# Patient Record
Sex: Female | Born: 1965 | Race: Black or African American | Hispanic: No | Marital: Married | Smoking: Never smoker
Health system: Southern US, Community
[De-identification: ages and names within clinical notes are randomized; demographics above are authoritative.]

## PROBLEM LIST (undated history)

## (undated) DIAGNOSIS — D649 Anemia, unspecified: Secondary | ICD-10-CM

## (undated) DIAGNOSIS — K219 Gastro-esophageal reflux disease without esophagitis: Secondary | ICD-10-CM

## (undated) DIAGNOSIS — K760 Fatty (change of) liver, not elsewhere classified: Secondary | ICD-10-CM

## (undated) DIAGNOSIS — I8501 Esophageal varices with bleeding: Secondary | ICD-10-CM

## (undated) DIAGNOSIS — G629 Polyneuropathy, unspecified: Secondary | ICD-10-CM

## (undated) DIAGNOSIS — K7460 Unspecified cirrhosis of liver: Secondary | ICD-10-CM

## (undated) DIAGNOSIS — N183 Chronic kidney disease, stage 3 unspecified: Secondary | ICD-10-CM

## (undated) DIAGNOSIS — E119 Type 2 diabetes mellitus without complications: Secondary | ICD-10-CM

## (undated) DIAGNOSIS — I10 Essential (primary) hypertension: Secondary | ICD-10-CM

## (undated) HISTORY — PX: OTHER SURGICAL HISTORY: SHX169

## (undated) HISTORY — PX: CHOLECYSTECTOMY: SHX55

## (undated) HISTORY — PX: CHOLECYSTECTOMY OPEN: SUR202

---

## 2003-05-27 ENCOUNTER — Encounter: Admission: RE | Admit: 2003-05-27 | Discharge: 2003-05-27 | Payer: Self-pay | Admitting: Nephrology

## 2004-06-18 ENCOUNTER — Other Ambulatory Visit: Payer: Self-pay

## 2004-06-18 ENCOUNTER — Emergency Department: Payer: Self-pay | Admitting: General Practice

## 2010-03-01 ENCOUNTER — Emergency Department (HOSPITAL_COMMUNITY)
Admission: EM | Admit: 2010-03-01 | Discharge: 2010-03-01 | Payer: Self-pay | Source: Home / Self Care | Admitting: Emergency Medicine

## 2010-03-06 ENCOUNTER — Emergency Department (HOSPITAL_COMMUNITY)
Admission: EM | Admit: 2010-03-06 | Discharge: 2010-03-06 | Disposition: A | Discharge status: Home / Self Care | Payer: PRIVATE HEALTH INSURANCE | Attending: Emergency Medicine | Admitting: Emergency Medicine

## 2010-03-06 DIAGNOSIS — Z79899 Other long term (current) drug therapy: Secondary | ICD-10-CM | POA: Insufficient documentation

## 2010-03-06 DIAGNOSIS — E119 Type 2 diabetes mellitus without complications: Secondary | ICD-10-CM | POA: Insufficient documentation

## 2010-03-06 DIAGNOSIS — I10 Essential (primary) hypertension: Secondary | ICD-10-CM | POA: Insufficient documentation

## 2010-03-06 DIAGNOSIS — R350 Frequency of micturition: Secondary | ICD-10-CM | POA: Insufficient documentation

## 2010-03-06 LAB — GLUCOSE, CAPILLARY
Glucose-Capillary: 425 mg/dL — ABNORMAL HIGH (ref 70–99)
Glucose-Capillary: 365 mg/dL — ABNORMAL HIGH (ref 70–99)

## 2010-03-06 LAB — DIFFERENTIAL
Lymphocytes Relative: 10 % — ABNORMAL LOW (ref 12–46)
Lymphs Abs: 0.7 10*3/uL (ref 0.7–4.0)
Monocytes Relative: 6 % (ref 3–12)
Neutrophils Relative %: 84 % — ABNORMAL HIGH (ref 43–77)

## 2010-03-06 LAB — URINALYSIS, ROUTINE W REFLEX MICROSCOPIC
Bilirubin Urine: NEGATIVE
Ketones, ur: NEGATIVE mg/dL
Leukocytes, UA: NEGATIVE
Urobilinogen, UA: 1 mg/dL (ref 0.0–1.0)
pH: 6 (ref 5.0–8.0)

## 2010-03-06 LAB — URINE MICROSCOPIC-ADD ON

## 2010-03-06 LAB — BASIC METABOLIC PANEL
CO2: 23 mEq/L (ref 19–32)
Chloride: 102 mEq/L (ref 96–112)
Creatinine, Ser: 0.97 mg/dL (ref 0.4–1.2)
Glucose, Bld: 407 mg/dL — ABNORMAL HIGH (ref 70–99)

## 2010-03-06 LAB — CBC
Hemoglobin: 10.6 g/dL — ABNORMAL LOW (ref 12.0–15.0)
MCHC: 32.5 g/dL (ref 30.0–36.0)
RDW: 14.7 % (ref 11.5–15.5)
WBC: 6.8 10*3/uL (ref 4.0–10.5)

## 2012-01-24 ENCOUNTER — Emergency Department: Payer: Self-pay | Admitting: Emergency Medicine

## 2012-01-26 LAB — BETA STREP CULTURE(ARMC)

## 2013-01-28 ENCOUNTER — Inpatient Hospital Stay (HOSPITAL_COMMUNITY): Payer: PRIVATE HEALTH INSURANCE

## 2013-01-28 ENCOUNTER — Emergency Department: Payer: Self-pay | Admitting: Emergency Medicine

## 2013-01-28 ENCOUNTER — Encounter (HOSPITAL_COMMUNITY): Payer: Self-pay | Admitting: Pulmonary Disease

## 2013-01-28 ENCOUNTER — Encounter (HOSPITAL_COMMUNITY)
Admission: EM | Disposition: A | Discharge status: Home / Self Care | Payer: Self-pay | Source: Other Acute Inpatient Hospital | Attending: Internal Medicine

## 2013-01-28 ENCOUNTER — Inpatient Hospital Stay (HOSPITAL_COMMUNITY)
Admission: EM | Admit: 2013-01-28 | Discharge: 2013-02-05 | DRG: 432 | Disposition: A | Discharge status: Home / Self Care | Payer: PRIVATE HEALTH INSURANCE | Source: Other Acute Inpatient Hospital | Attending: Internal Medicine | Admitting: Internal Medicine

## 2013-01-28 DIAGNOSIS — K769 Liver disease, unspecified: Secondary | ICD-10-CM | POA: Diagnosis present

## 2013-01-28 DIAGNOSIS — E119 Type 2 diabetes mellitus without complications: Secondary | ICD-10-CM | POA: Diagnosis present

## 2013-01-28 DIAGNOSIS — F40298 Other specified phobia: Secondary | ICD-10-CM | POA: Diagnosis present

## 2013-01-28 DIAGNOSIS — I8501 Esophageal varices with bleeding: Secondary | ICD-10-CM | POA: Diagnosis present

## 2013-01-28 DIAGNOSIS — J9819 Other pulmonary collapse: Secondary | ICD-10-CM | POA: Diagnosis present

## 2013-01-28 DIAGNOSIS — I8511 Secondary esophageal varices with bleeding: Secondary | ICD-10-CM | POA: Diagnosis present

## 2013-01-28 DIAGNOSIS — Z9089 Acquired absence of other organs: Secondary | ICD-10-CM

## 2013-01-28 DIAGNOSIS — I10 Essential (primary) hypertension: Secondary | ICD-10-CM | POA: Diagnosis present

## 2013-01-28 DIAGNOSIS — D696 Thrombocytopenia, unspecified: Secondary | ICD-10-CM | POA: Diagnosis present

## 2013-01-28 DIAGNOSIS — D62 Acute posthemorrhagic anemia: Secondary | ICD-10-CM | POA: Diagnosis present

## 2013-01-28 DIAGNOSIS — F411 Generalized anxiety disorder: Secondary | ICD-10-CM | POA: Diagnosis present

## 2013-01-28 DIAGNOSIS — K922 Gastrointestinal hemorrhage, unspecified: Secondary | ICD-10-CM | POA: Diagnosis present

## 2013-01-28 DIAGNOSIS — K219 Gastro-esophageal reflux disease without esophagitis: Secondary | ICD-10-CM | POA: Diagnosis present

## 2013-01-28 DIAGNOSIS — K7460 Unspecified cirrhosis of liver: Principal | ICD-10-CM | POA: Diagnosis present

## 2013-01-28 DIAGNOSIS — Z800 Family history of malignant neoplasm of digestive organs: Secondary | ICD-10-CM

## 2013-01-28 DIAGNOSIS — Z79899 Other long term (current) drug therapy: Secondary | ICD-10-CM

## 2013-01-28 DIAGNOSIS — IMO0001 Reserved for inherently not codable concepts without codable children: Secondary | ICD-10-CM | POA: Diagnosis present

## 2013-01-28 DIAGNOSIS — R1310 Dysphagia, unspecified: Secondary | ICD-10-CM | POA: Diagnosis not present

## 2013-01-28 HISTORY — DX: Esophageal varices with bleeding: I85.01

## 2013-01-28 HISTORY — DX: Essential (primary) hypertension: I10

## 2013-01-28 HISTORY — DX: Type 2 diabetes mellitus without complications: E11.9

## 2013-01-28 HISTORY — PX: ESOPHAGOGASTRODUODENOSCOPY: SHX5428

## 2013-01-28 LAB — BASIC METABOLIC PANEL
BUN: 26 mg/dL — ABNORMAL HIGH (ref 7–18)
Calcium, Total: 8.6 mg/dL (ref 8.5–10.1)
Chloride: 103 mmol/L (ref 98–107)
Co2: 21 mmol/L (ref 21–32)
Creatinine: 1.22 mg/dL (ref 0.60–1.30)
EGFR (African American): >60
Glucose: 432 mg/dL — ABNORMAL HIGH (ref 65–99)
Osmolality: 292 (ref 275–301)
Potassium: 3.9 mmol/L (ref 3.5–5.1)

## 2013-01-28 LAB — COMPREHENSIVE METABOLIC PANEL
ALT: 10 U/L (ref 0–35)
AST: 14 U/L (ref 0–37)
CO2: 21 mEq/L (ref 19–32)
Calcium: 7.5 mg/dL — ABNORMAL LOW (ref 8.4–10.5)
Chloride: 105 mEq/L (ref 96–112)
GFR calc non Af Amer: 67 mL/min — ABNORMAL LOW (ref >90)
Potassium: 4.1 mEq/L (ref 3.7–5.3)
Sodium: 137 mEq/L (ref 137–147)
Total Bilirubin: 0.6 mg/dL (ref 0.3–1.2)

## 2013-01-28 LAB — HEMOGLOBIN AND HEMATOCRIT, BLOOD
HCT: 18.1 % — ABNORMAL LOW (ref 36.0–46.0)
Hemoglobin: 5.9 g/dL — CL (ref 12.0–15.0)

## 2013-01-28 LAB — MRSA PCR SCREENING: MRSA by PCR: NEGATIVE

## 2013-01-28 LAB — CBC WITH DIFFERENTIAL/PLATELET
Basophil #: 0.1 10*3/uL (ref 0.0–0.1)
Basophil %: 0.4 %
HCT: 14 % — CL (ref 35.0–47.0)
Lymphocyte #: 2.4 10*3/uL (ref 1.0–3.6)
Lymphocyte %: 14.9 %
MCH: 22.9 pg — ABNORMAL LOW (ref 26.0–34.0)
MCHC: 29.9 g/dL — ABNORMAL LOW (ref 32.0–36.0)
Monocyte #: 0.8 x10 3/mm (ref 0.2–0.9)
Monocyte %: 4.8 %

## 2013-01-28 LAB — GLUCOSE, CAPILLARY
Glucose-Capillary: 421 mg/dL — ABNORMAL HIGH (ref 70–99)
Glucose-Capillary: 434 mg/dL — ABNORMAL HIGH (ref 70–99)
Glucose-Capillary: 232 mg/dL — ABNORMAL HIGH (ref 70–99)

## 2013-01-28 LAB — CBC
MCV: 80.8 fL (ref 78.0–100.0)
Platelets: 126 10*3/uL — ABNORMAL LOW (ref 150–400)
RDW: 15.9 % — ABNORMAL HIGH (ref 11.5–15.5)
WBC: 13.2 10*3/uL — ABNORMAL HIGH (ref 4.0–10.5)

## 2013-01-28 LAB — TROPONIN I
Troponin I: <0.3 ng/mL (ref <0.30)
Troponin I: <0.3 ng/mL (ref <0.30)

## 2013-01-28 LAB — HEMOGLOBIN A1C: Hgb A1c MFr Bld: 8.5 % — ABNORMAL HIGH (ref <5.7)

## 2013-01-28 LAB — PREPARE RBC (CROSSMATCH)

## 2013-01-28 SURGERY — EGD (ESOPHAGOGASTRODUODENOSCOPY)
Anesthesia: Moderate Sedation

## 2013-01-28 MED ORDER — INSULIN ASPART 100 UNIT/ML ~~LOC~~ SOLN
0.0000 [IU] | SUBCUTANEOUS | Status: DC
  Administered 2013-01-28: 20 [IU] via SUBCUTANEOUS
  Administered 2013-01-28 (×2): 7 [IU] via SUBCUTANEOUS
  Administered 2013-01-29: 4 [IU] via SUBCUTANEOUS
  Administered 2013-01-29: 3 [IU] via SUBCUTANEOUS
  Administered 2013-01-29 (×2): 7 [IU] via SUBCUTANEOUS
  Administered 2013-01-30 (×2): 4 [IU] via SUBCUTANEOUS
  Administered 2013-01-30 (×2): 3 [IU] via SUBCUTANEOUS
  Administered 2013-01-30: 4 [IU] via SUBCUTANEOUS
  Administered 2013-01-30: 3 [IU] via SUBCUTANEOUS
  Administered 2013-01-31 (×2): 4 [IU] via SUBCUTANEOUS
  Administered 2013-01-31: 3 [IU] via SUBCUTANEOUS

## 2013-01-28 MED ORDER — BUTAMBEN-TETRACAINE-BENZOCAINE 2-2-14 % EX AERO
INHALATION_SPRAY | CUTANEOUS | Status: DC | PRN
  Administered 2013-01-28: 2 via TOPICAL

## 2013-01-28 MED ORDER — BIOTENE DRY MOUTH MT LIQD
15.0000 mL | Freq: Two times a day (BID) | OROMUCOSAL | Status: DC
  Administered 2013-01-29 – 2013-02-04 (×12): 15 mL via OROMUCOSAL

## 2013-01-28 MED ORDER — FENTANYL CITRATE 0.05 MG/ML IJ SOLN
INTRAMUSCULAR | Status: AC
  Filled 2013-01-28: qty 2

## 2013-01-28 MED ORDER — OCTREOTIDE LOAD VIA INFUSION
50.0000 ug | Freq: Once | INTRAVENOUS | Status: AC
  Administered 2013-01-28: 50 ug via INTRAVENOUS
  Filled 2013-01-28: qty 25

## 2013-01-28 MED ORDER — MIDAZOLAM HCL 5 MG/ML IJ SOLN
INTRAMUSCULAR | Status: AC
  Filled 2013-01-28: qty 2

## 2013-01-28 MED ORDER — MORPHINE SULFATE 2 MG/ML IJ SOLN
2.0000 mg | INTRAMUSCULAR | Status: DC | PRN
  Administered 2013-01-28 (×2): 2 mg via INTRAVENOUS
  Administered 2013-01-29: 4 mg via INTRAVENOUS
  Administered 2013-01-29: 2 mg via INTRAVENOUS
  Filled 2013-01-28 (×3): qty 1
  Filled 2013-01-28: qty 2

## 2013-01-28 MED ORDER — SODIUM CHLORIDE 0.9 % IV SOLN
25.0000 ug/h | INTRAVENOUS | Status: DC
  Administered 2013-01-28 – 2013-01-31 (×4): 25 ug/h via INTRAVENOUS
  Filled 2013-01-28 (×9): qty 1

## 2013-01-28 MED ORDER — SODIUM CHLORIDE 0.9 % IV SOLN
8.0000 mg/h | INTRAVENOUS | Status: AC
  Administered 2013-01-28 – 2013-01-30 (×6): 8 mg/h via INTRAVENOUS
  Filled 2013-01-28 (×12): qty 80

## 2013-01-28 MED ORDER — FENTANYL CITRATE 0.05 MG/ML IJ SOLN
INTRAMUSCULAR | Status: DC | PRN
  Administered 2013-01-28 (×3): 25 ug via INTRAVENOUS

## 2013-01-28 MED ORDER — SODIUM CHLORIDE 0.9 % IV SOLN
INTRAVENOUS | Status: DC
  Administered 2013-01-28: 50 mL/h via INTRAVENOUS
  Administered 2013-01-29: 06:00:00 via INTRAVENOUS

## 2013-01-28 MED ORDER — MIDAZOLAM HCL 10 MG/2ML IJ SOLN
INTRAMUSCULAR | Status: DC | PRN
  Administered 2013-01-28 (×2): 2 mg via INTRAVENOUS
  Administered 2013-01-28 (×2): 1 mg via INTRAVENOUS
  Administered 2013-01-28: 2 mg via INTRAVENOUS

## 2013-01-28 MED ORDER — SODIUM CHLORIDE 0.9 % IV SOLN
250.0000 mL | INTRAVENOUS | Status: DC | PRN
  Administered 2013-01-30: 250 mL via INTRAVENOUS
  Administered 2013-01-31: 20 mL via INTRAVENOUS
  Administered 2013-02-05: 05:00:00 via INTRAVENOUS

## 2013-01-28 NOTE — Progress Notes (Signed)
Brief Op Note.  Large esophageal varices s/p banding x 7 with 6 successful band placements. Nonbleeding fundic varices.

## 2013-01-28 NOTE — Op Note (Signed)
[facility] [address] [place], [address]   OPERATIVE PROCEDURE REPORT  PATIENT: Selena Bennett, Selena Bennett.  MR#: [id number] BIRTHDATE: 07-Jun-1965  GENDER: Female ENDOSCOPIST: Jeani Hawking, MD ASSISTANT:   Kandice Robinsons, technician and Karoline Caldwell, RN, BSN  PROCEDURE DATE: 01/28/2013 PROCEDURE:   EGD with control of bleeding through banding ASA CLASS:   Class III INDICATIONS: Hematemesis and severe anemia MEDICATIONS: Versed 8 mg IV and Fentanyl 75 mcg IV TOPICAL ANESTHETIC:   Cetacaine Spray  DESCRIPTION OF PROCEDURE:   After the risks benefits and alternatives of the procedure were thoroughly explained, informed consent was obtained.  The PENTAX GASTOROSCOPE [id number]  endoscope was introduced through the mouth  and advanced to the second portion of the duodenum Without limitations.      The instrument was slowly withdrawn as the mucosa was fully examined.    FINDINGS: In the mid to distal esophagus, large esophageal varices were identified.  No active bleeding from this site, but it was high suspicious as a source of bleeding.  In the gastric lumen a large clot was in the body of the stomach.  In the fundus a conglomeration of fundic varices were identified.  No active bleeding noted.  In the antrum fresh blood was noted, but no source was found in this site.  The duodenum was normal after washing the mucosa.  Attention was redirected to the esophageal varices.  Seven bands were deployed and 6 were successful.  The seventh band spontaneously released the mucosa shortly after deployement. The scope was then withdrawn from the patient and the procedure terminated.  COMPLICATIONS: There were no complications. IMPRESSION: 1) Large esophageal varices. 2) Fundic varices.  RECOMMENDATIONS: 1) Start octreotide. 2) Check HBV and HCV. 3) Check AFP. 4) Monitor HGB and transfuse as necessary.  Do not transfuse above 10  g/dL.  _______________________________ eSignedJeani Hawking, MD 01/28/2013 6:17 PM

## 2013-01-28 NOTE — Progress Notes (Signed)
Utilization Review Completed.Nishtha Raider, Johnetta T12/30/2014  

## 2013-01-28 NOTE — H&P (Signed)
Name: Selena Bennett MRN: [id number] DOB: 05/26/1965    ADMISSION DATE:  01/28/2013  REFERRING MD :  Randell Loop ER PRIMARY SERVICE: PCCM  CHIEF COMPLAINT:  GIB  BRIEF PATIENT DESCRIPTION: 47 y/o F with PMH of HTN, DM, GERD admitted to [facility] on 12/30 after acute syncopal episode with GIB.  Hgb 4.2 on presentation to [facility].    SIGNIFICANT EVENTS / STUDIES:  12/3 - Admit with syncope, GIB, Hgb 4.2.  Hypertensive.   LINES / TUBES: L IJ TLC 12/30>>>  CULTURES:   ANTIBIOTICS:   HISTORY OF PRESENT ILLNESS:  47 y/o F with PMH of HTN, DM, GERD & cholecystectomy who presented to [facility] on 12/30 after syncopal episode at home.  Patient reports she has been weak since 12/28 with mild SOB with exertion.  Woke and went to restroom, felt hot & warm all over while using bathroom then woke to her family.  She does not remember passing out.  Family reports finding her in bloody emesis.  Patient denies chest pain, nausea, abdominal pain, previous syncope or pre-syncope.  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  She reports back pain and has been taking ibuprofen occasionally - once per month and is also on a baby aspirin.    ER evaluation at [facility] noted Hgb of 4.2, tachycardia, normotension and elevated blood glucose of 432.  Patient transferred to [facility] for GI evaluation.   Denies dark stool, worsening reflux, weight loss, abd pain, swelling of extremities, palpitations.      PAST MEDICAL HISTORY :  Past Medical History  Diagnosis Date  . Diabetes   . Hypertension    Past Surgical History  Procedure Laterality Date  . Cholecystectomy open     Prior to Admission medications   Not on File   No Known Allergies  FAMILY HISTORY:  No family history on file.  SOCIAL HISTORY:  has no tobacco, alcohol, and drug history on file.  REVIEW OF SYSTEMS:   Gen: Denies fever, chills, weight change, fatigue, night sweats HEENT: Denies blurred vision, double vision, hearing loss,  tinnitus, sinus congestion, rhinorrhea, sore throat, neck stiffness, dysphagia PULM: Denies cough, sputum production, hemoptysis, wheezing CV: Denies chest pain, edema, orthopnea, paroxysmal nocturnal dyspnea, palpitations GI: Denies abdominal pain, nausea, vomiting, diarrhea, hematochezia, melena, constipation, change in bowel habits.  SEE HPI GU: Denies dysuria, hematuria, polyuria, oliguria, urethral discharge Endocrine: Denies hot or cold intolerance, polyuria, polyphagia or appetite change Derm: Denies rash, dry skin, scaling or peeling skin change Heme: Denies easy bruising, bleeding, bleeding gums Neuro: Denies headache, numbness, weakness, slurred speech, loss of memory or consciousness   SUBJECTIVE:   VITAL SIGNS: Temp:  [99.1 F (37.3 C)] 99.1 F (37.3 C) (12/30 1055) Pulse Rate:  [106-114] 106 (12/30 1130) Resp:  [14-17] 14 (12/30 1130) BP: (132-148)/(74-84) 132/84 mmHg (12/30 1130) SpO2:  [100 %] 100 % (12/30 1130) Weight:  [206 lb 12.7 oz (93.8 kg)] 206 lb 12.7 oz (93.8 kg) (12/30 1055)  HEMODYNAMICS:    VENTILATOR SETTINGS:    INTAKE / OUTPUT: Intake/Output   None     PHYSICAL EXAMINATION: General:  wdwn adult female in NAD, pale Neuro:  AAOx4, speech clear, MAE HEENT:  Mm pale, dry, no jvd Cardiovascular:  s1s2 rrr, tachy, no m/r/g Lungs:  resp's even/non-labored, lungs bilaterally clear Abdomen:  Round/soft, bsx4 active, no tenderness with palpation Musculoskeletal:  No acute deformities Skin:  Pale, warm/dry, no edema  LABS:  CBC No results found for  this basename: WBC, HGB, HCT, PLT,  in the last 168 hours Coag's No results found for this basename: APTT, INR,  in the last 168 hours BMET No results found for this basename: NA, K, CL, CO2, BUN, CREATININE, GLUCOSE,  in the last 168 hours Electrolytes No results found for this basename: CALCIUM, MG, PHOS,  in the last 168 hours Sepsis Markers No results found for this basename: LATICACIDVEN,  PROCALCITON, O2SATVEN,  in the last 168 hours ABG No results found for this basename: PHART, PCO2ART, PO2ART,  in the last 168 hours Liver Enzymes No results found for this basename: AST, ALT, ALKPHOS, BILITOT, ALBUMIN,  in the last 168 hours Cardiac Enzymes No results found for this basename: TROPONINI, PROBNP,  in the last 168 hours Glucose  Recent Labs Lab 01/28/13 1047  GLUCAP 421*    Imaging No results found.   ASSESSMENT / PLAN:  GASTROINTESTINAL A:   GIB - likely slow blood loss given hemodynamic stability, acute syncope prior to admit P:   -GI consult -PPI gtt  HEMATOLOGIC A:   Anemia - blood loss DVT Prophylaxis  P:  -PRBC's x 2 unit at [facility] -assess H/H Q6 -type & cross x 2 unit PRBC, hold further admin until repeat H/H post 2 units from [facility] -SCD's -no heparin or ASA given GIB  CARDIOVASCULAR A:  Rule Out Ischemia  Syncope HTN P:  -assess EKG -trend troponin  -hold home lisinopril  RENAL A:   At Risk AKI - in setting of ACE-I, Metformin & blood loss P:   -trend BMP -gentle hydration / NS @ 50 -volume replacement with PRBC's  PULMONARY A: At risk Atelectasis P:   -pulmonary hygiene -oxygen PRN to support sats > 93%  INFECTIOUS A:   No evidence of acute infectious process P:   -monitor fever curve / leukocytosis  ENDOCRINE A:   Hyperglycemia   P:   -assess A1c -SSI, resistant scale -hold home metformin  NEUROLOGIC A:   Anxiety  P:   -supportive care  Canary Brim, NP-C [place] [facility] Pgr: [phone] or [phone]   I have personally obtained a history, examined the patient, evaluated laboratory and imaging results, formulated the assessment and plan and placed orders.  Cyril Mourning MD. Tonny Bollman. [place] [facility] Pager [phone] If no response call [phone]     01/28/2013, 12:15 PM

## 2013-01-28 NOTE — Progress Notes (Signed)
eLink Physician-Brief Progress Note Patient Name: TENZIN PAVON DOB: [birth year] MRN: [id number]  Date of Service  01/28/2013   HPI/Events of Note  Note esophageal varices banded on EGD today.   eICU Interventions  - LFT's in am - RUQ [place] ordered   Intervention Category Minor Interventions: Clinical assessment - ordering diagnostic tests  Aaryn Parrilla S. 01/28/2013, 6:28 PM

## 2013-01-28 NOTE — Progress Notes (Signed)
eLink Physician-Brief Progress Note Patient Name: MONEKA MCQUINN DOB: February 22, 1965 MRN: [id number]  Date of Service  01/28/2013   HPI/Events of Note   C/o abd pain post procedure Will defer to GI as to when she can take PO  eICU Interventions  Morphine ordered   Intervention Category Intermediate Interventions: Abdominal pain - evaluation and management  Janavia Rottman S. 01/28/2013, 5:26 PM

## 2013-01-28 NOTE — Consult Note (Signed)
Unassigned Patient  Reason for Consult: Severe anemia Referring Physician: CCM  Prentice Docker HPI: This is a 47 year old female with a PMH of DM and HTN who was transferred from [facility] after presenting with a syncopal episode.  When she woke up at home she was in a pool of blood from hematemesis.  No complaints of abdominal pain, nausea, or vomiting before this event.  She has a history of severe GERD.  No recent use of NSAIDs or ETOH use/abuse.  There is no prior history of PUD or an upper GI bleed.  The patient also denies any history of hematochezia or melena.  In the ER at [facility] she was identified to have an HGB of 4.2 g/dL and she was mildly hypotensive and tachycardic in the 120's.  Past Medical History  Diagnosis Date  . Diabetes   . Hypertension     Past Surgical History  Procedure Laterality Date  . Cholecystectomy open      No family history on file.  Social History:  has no tobacco, alcohol, and drug history on file.  Allergies: No Known Allergies  Medications:  Scheduled: . insulin aspart  0-20 Units Subcutaneous Q4H   Continuous: . sodium chloride 50 mL/hr (01/28/13 1200)  . pantoprozole (PROTONIX) infusion 8 mg/hr (01/28/13 1200)    Results for orders placed during the hospital encounter of 01/28/13 (from the past 24 hour(s))  GLUCOSE, CAPILLARY     Status: Abnormal   Collection Time    01/28/13 10:47 AM      Result Value Range   Glucose-Capillary 421 (*) 70 - 99 mg/dL   Comment 1 Notify RN     Comment 2 Documented in Chart    MRSA PCR SCREENING     Status: None   Collection Time    01/28/13 11:00 AM      Result Value Range   MRSA by PCR NEGATIVE  NEGATIVE  HEMOGLOBIN AND HEMATOCRIT, BLOOD     Status: Abnormal   Collection Time    01/28/13 12:10 PM      Result Value Range   Hemoglobin 5.9 (*) 12.0 - 15.0 g/dL   HCT 40.9 (*) 81.1 - 91.4 %  COMPREHENSIVE METABOLIC PANEL     Status: Abnormal   Collection Time     01/28/13 12:10 PM      Result Value Range   Sodium 137  137 - 147 mEq/L   Potassium 4.1  3.7 - 5.3 mEq/L   Chloride 105  96 - 112 mEq/L   CO2 21  19 - 32 mEq/L   Glucose, Bld 395 (*) 70 - 99 mg/dL   BUN 27 (*) 6 - 23 mg/dL   Creatinine, Ser 7.82  0.50 - 1.10 mg/dL   Calcium 7.5 (*) 8.4 - 10.5 mg/dL   Total Protein 5.3 (*) 6.0 - 8.3 g/dL   Albumin 2.3 (*) 3.5 - 5.2 g/dL   AST 14  0 - 37 U/L   ALT 10  0 - 35 U/L   Alkaline Phosphatase 82  39 - 117 U/L   Total Bilirubin 0.6  0.3 - 1.2 mg/dL   GFR calc non Af Amer 67 (*) >90 mL/min   GFR calc Af Amer 78 (*) >90 mL/min  GLUCOSE, CAPILLARY     Status: Abnormal   Collection Time    01/28/13 12:23 PM      Result Value Range   Glucose-Capillary 434 (*) 70 - 99 mg/dL  TROPONIN I     Status: None   Collection Time    01/28/13  1:00 PM      Result Value Range   Troponin I <0.30  <0.30 ng/mL  TYPE AND SCREEN     Status: None   Collection Time    01/28/13  1:05 PM      Result Value Range   ABO/RH(D) A POS     Antibody Screen NEG     Sample Expiration 01/31/2013     Unit Number [id number]     Blood Component Type RED CELLS,LR     Unit division 00     Status of Unit ISSUED     Transfusion Status OK TO TRANSFUSE     Crossmatch Result Compatible     Unit Number [id number]     Blood Component Type RED CELLS,LR     Unit division 00     Status of Unit ALLOCATED     Transfusion Status OK TO TRANSFUSE     Crossmatch Result Compatible    PREPARE RBC (CROSSMATCH)     Status: None   Collection Time    01/28/13  1:05 PM      Result Value Range   Order Confirmation ORDER PROCESSED BY BLOOD BANK    ABO/RH     Status: None   Collection Time    01/28/13  1:05 PM      Result Value Range   ABO/RH(D) A POS    PREPARE RBC (CROSSMATCH)     Status: None   Collection Time    01/28/13  2:22 PM      Result Value Range   Order Confirmation BLOOD ALREADY AVAILABLE       Dg Chest Port 1 View  01/28/2013   CLINICAL DATA:  Central line  placement  EXAM: PORTABLE CHEST - 1 VIEW  COMPARISON:  None.  FINDINGS: Left IJ approach central venous catheter. The catheter tip is at the superior cavoatrial junction. No evidence of complicating pneumothorax. Lung volumes are low and there is trace bibasilar atelectasis. No pulmonary edema, pleural effusion or pneumothorax. The cardiac silhouette appears at the upper limits of normal but is likely exaggerated by portable frontal technique.  IMPRESSION: 1. The tip of the left IJ approach central venous catheter is at the superior cavoatrial junction. No evidence of a pneumothorax. 2. Borderline enlargement of the cardiopericardial silhouette is likely exaggerated by portable frontal technique. 3. Low lung volumes with minimal bibasilar atelectasis. Otherwise, the lungs are clear. These results were called by telephone at the time of interpretation on 01/28/2013 at 12:46 PM to nurse Candace, who verbally acknowledged these results.   Electronically Signed   By: Malachy Moan M.D.   On: 01/28/2013 12:46    ROS:  As stated above in the HPI otherwise negative.  Blood pressure 121/73, pulse 110, temperature 99.5 F (37.5 C), temperature source Oral, resp. rate 25, height 5\' 5"  (1.651 m), weight 206 lb 12.7 oz (93.8 kg), last menstrual period 01/13/2013, SpO2 100.00%.    PE: Gen: NAD, Alert and Oriented HEENT:  [place]/AT, EOMI Neck: Supple, no LAD Lungs: CTA Bilaterally CV: RRR without M/G/R ABM: Soft, NTND, +BS Ext: No C/C/E  Assessment/Plan: 1) Hematemesis. 2) Severe anemia.   She has improved hemodynamically.  In fact, she is mildly hypertensive.  Her HGB has also increased to the 6 range.  No complaints of any chest pain or SOB.  With her presentation I am suspecting an upper GI bleed.  Plan: 1) EGD now.  Yacine Garriga D 01/28/2013, 3:10 PM

## 2013-01-28 NOTE — Procedures (Signed)
Central Venous Catheter Insertion Procedure Note Selena Bennett [id number] 07-Jul-1965  Procedure: Insertion of Central Venous Catheter Indications: Assessment of intravascular volume, Drug and/or fluid administration and Frequent blood sampling  Procedure Details Consent: Risks of procedure as well as the alternatives and risks of each were explained to the (patient/caregiver).  Consent for procedure obtained.  Time Out: Verified patient identification, verified procedure, site/side was marked, verified correct patient position, special equipment/implants available, medications/allergies/relevent history reviewed, required imaging and test results available.  Performed  Maximum sterile technique was used including antiseptics, cap, gloves, gown, hand hygiene, mask and sheet. Skin prep: Chlorhexidine; local anesthetic administered A antimicrobial bonded/coated triple lumen catheter was placed in the left internal jugular vein using the Seldinger technique.  Evaluation Blood flow good Complications: No apparent complications Patient did tolerate procedure well. Chest X-ray ordered to verify placement.  CXR: reviewed and in good position, no complicating features.    Procedure performed under direct supervision of Dr. Vassie Loll and with ultrasound guidance for real time vessel cannulation.     Canary Brim, NP-C [place] [facility] Pgr: [phone] or [phone]  [facility].   01/28/2013, 12:25 PM

## 2013-01-29 ENCOUNTER — Encounter (HOSPITAL_COMMUNITY): Payer: Self-pay | Admitting: Gastroenterology

## 2013-01-29 ENCOUNTER — Inpatient Hospital Stay (HOSPITAL_COMMUNITY): Payer: PRIVATE HEALTH INSURANCE

## 2013-01-29 LAB — HEPATIC FUNCTION PANEL
Albumin: 2.3 g/dL — ABNORMAL LOW (ref 3.5–5.2)
Bilirubin, Direct: <0.2 mg/dL (ref 0.0–0.3)
Total Bilirubin: 0.9 mg/dL (ref 0.3–1.2)

## 2013-01-29 LAB — BASIC METABOLIC PANEL
BUN: 27 mg/dL — ABNORMAL HIGH (ref 6–23)
Calcium: 7.5 mg/dL — ABNORMAL LOW (ref 8.4–10.5)
Creatinine, Ser: 1.05 mg/dL (ref 0.50–1.10)
GFR calc Af Amer: 73 mL/min — ABNORMAL LOW (ref >90)
GFR calc non Af Amer: 63 mL/min — ABNORMAL LOW (ref >90)
Potassium: 4.4 mEq/L (ref 3.7–5.3)

## 2013-01-29 LAB — AFP TUMOR MARKER: AFP-Tumor Marker: 2.4 ng/mL (ref 0.0–8.0)

## 2013-01-29 LAB — CBC
HCT: 24.9 % — ABNORMAL LOW (ref 36.0–46.0)
MCHC: 32.5 g/dL (ref 30.0–36.0)
MCV: 80.8 fL (ref 78.0–100.0)
RDW: 16 % — ABNORMAL HIGH (ref 11.5–15.5)

## 2013-01-29 LAB — TYPE AND SCREEN: Unit division: 0

## 2013-01-29 LAB — GLUCOSE, CAPILLARY
Glucose-Capillary: 120 mg/dL — ABNORMAL HIGH (ref 70–99)
Glucose-Capillary: 153 mg/dL — ABNORMAL HIGH (ref 70–99)
Glucose-Capillary: 235 mg/dL — ABNORMAL HIGH (ref 70–99)
Glucose-Capillary: 131 mg/dL — ABNORMAL HIGH (ref 70–99)

## 2013-01-29 LAB — HEPATITIS C ANTIBODY: HCV Ab: NEGATIVE

## 2013-01-29 LAB — HEPATITIS B SURFACE ANTIGEN: Hepatitis B Surface Ag: NEGATIVE

## 2013-01-29 LAB — TROPONIN I: Troponin I: <0.3 ng/mL (ref <0.30)

## 2013-01-29 MED ORDER — ONDANSETRON HCL 4 MG/2ML IJ SOLN
4.0000 mg | INTRAMUSCULAR | Status: DC | PRN
  Administered 2013-01-29 – 2013-02-05 (×3): 4 mg via INTRAVENOUS
  Filled 2013-01-29 (×2): qty 2

## 2013-01-29 MED ORDER — ONDANSETRON HCL 4 MG/2ML IJ SOLN
4.0000 mg | Freq: Four times a day (QID) | INTRAMUSCULAR | Status: DC | PRN
  Administered 2013-01-29 (×2): 4 mg via INTRAVENOUS
  Filled 2013-01-29 (×3): qty 2

## 2013-01-29 MED ORDER — FENTANYL CITRATE 0.05 MG/ML IJ SOLN
12.5000 ug | INTRAMUSCULAR | Status: DC | PRN
  Administered 2013-01-29 (×3): 25 ug via INTRAVENOUS
  Filled 2013-01-29 (×3): qty 2

## 2013-01-29 MED ORDER — FENTANYL CITRATE 0.05 MG/ML IJ SOLN
25.0000 ug | INTRAMUSCULAR | Status: DC | PRN
  Administered 2013-01-29 – 2013-01-31 (×11): 50 ug via INTRAVENOUS
  Filled 2013-01-29 (×12): qty 2

## 2013-01-29 MED ORDER — METOCLOPRAMIDE HCL 5 MG/ML IJ SOLN
10.0000 mg | Freq: Once | INTRAMUSCULAR | Status: AC
  Administered 2013-01-29: 10 mg via INTRAVENOUS
  Filled 2013-01-29: qty 2

## 2013-01-29 MED ORDER — DEXTROSE 5 % IV SOLN
1.0000 g | Freq: Every day | INTRAVENOUS | Status: DC
  Administered 2013-01-29 – 2013-01-30 (×2): 1 g via INTRAVENOUS
  Filled 2013-01-29 (×2): qty 10

## 2013-01-29 NOTE — Progress Notes (Signed)
Spoke with MD regarding uncontrolled pain and nausea; orders received; will continue to monitor.  Vivi Martens RN

## 2013-01-29 NOTE — Progress Notes (Addendum)
Noted pt HgbA1C of 8.5%  However, her Hgb on admission was documented as 4.2, thus the A1C would not be accurate (most probably value is higher than actual value.)  CBG's in 200 range on rsistant tidwc.  No po intake documented.  May be helpful to use a moderate correction q 4 hrs until controlled. Thank you, Lenor Coffin, RN, CNS, Diabetes Coordinator [phone])

## 2013-01-29 NOTE — Progress Notes (Signed)
Subjective: Nauseated and complaining of chest pain.  Objective: Vital signs in last 24 hours: Temp:  [98.3 F (36.8 C)-99.7 F (37.6 C)] 98.3 F (36.8 C) (12/31 0426) Pulse Rate:  [75-123] 85 (12/31 0700) Resp:  [10-25] 15 (12/31 0700) BP: (97-148)/(46-94) 127/77 mmHg (12/31 0700) SpO2:  [99 %-100 %] 100 % (12/31 0700) Weight:  [206 lb 12.7 oz (93.8 kg)] 206 lb 12.7 oz (93.8 kg) (12/30 1055) Last BM Date: 01/27/13  Intake/Output from previous day: 12/30 0701 - 12/31 0700 In: 2265.5 [I.V.:1600; Blood:665.5] Out: 1850 [Urine:1850] Intake/Output this shift:    General appearance: alert and nauseous GI: soft, non-tender; bowel sounds normal; no masses,  no organomegaly  Lab Results:  Recent Labs  01/28/13 1210 01/28/13 1930 01/29/13 0220  WBC  --  13.2* 15.1*  HGB 5.9* 8.3* 8.1*  HCT 18.1* 24.9* 24.9*  PLT  --  126* 153   BMET  Recent Labs  01/28/13 1210 01/29/13 0220  NA 137 143  K 4.1 4.4  CL 105 111  CO2 21 23  GLUCOSE 395* 135*  BUN 27* 27*  CREATININE 0.99 1.05  CALCIUM 7.5* 7.5*   LFT  Recent Labs  01/29/13 0220  PROT 5.5*  ALBUMIN 2.3*  AST 16  ALT 11  ALKPHOS 76  BILITOT 0.9  BILIDIR <0.2  IBILI NOT CALCULATED   PT/INR No results found for this basename: LABPROT, INR,  in the last 72 hours Hepatitis Panel  Recent Labs  01/28/13 1930  HEPBSAG NEGATIVE  HCVAB NEGATIVE   C-Diff No results found for this basename: CDIFFTOX,  in the last 72 hours Fecal Lactopherrin No results found for this basename: FECLLACTOFRN,  in the last 72 hours  Studies/Results: Dg Chest Port 1 View  01/28/2013   CLINICAL DATA:  Central line placement  EXAM: PORTABLE CHEST - 1 VIEW  COMPARISON:  None.  FINDINGS: Left IJ approach central venous catheter. The catheter tip is at the superior cavoatrial junction. No evidence of complicating pneumothorax. Lung volumes are low and there is trace bibasilar atelectasis. No pulmonary edema, pleural effusion or  pneumothorax. The cardiac silhouette appears at the upper limits of normal but is likely exaggerated by portable frontal technique.  IMPRESSION: 1. The tip of the left IJ approach central venous catheter is at the superior cavoatrial junction. No evidence of a pneumothorax. 2. Borderline enlargement of the cardiopericardial silhouette is likely exaggerated by portable frontal technique. 3. Low lung volumes with minimal bibasilar atelectasis. Otherwise, the lungs are clear. These results were called by telephone at the time of interpretation on 01/28/2013 at 12:46 PM to nurse Candace, who verbally acknowledged these results.   Electronically Signed   By: Malachy Moan M.Bennett.   On: 01/28/2013 12:46   US Abdomen Limited Ruq  01/28/2013   CLINICAL DATA:  GI bleed. History of cholecystectomy. Question cirrhosis.  EXAM: US ABDOMEN LIMITED - RIGHT UPPER QUADRANT  COMPARISON:  None.  FINDINGS: Gallbladder  Surgically absent.  Common bile duct  Diameter: 6.5 mm.  No intraductal calculi identified.  Liver:  The liver is diffusely heterogeneous in echotexture and incompletely visualized. There is a central echogenic lesion measuring approximately 3.2 x 2.1 x 1.6 cm. The portal vein is patent with hepatopetal flow. No ascites is documented.  IMPRESSION: 1. No biliary dilatation status post cholecystectomy. 2. Diffusely heterogeneous hepatic parenchyma with central echogenic lesion. These findings are nonspecific and could be secondary to heterogeneous steatosis. However, with the clinical suspicion of cirrhosis, further evaluation is  recommended to exclude infiltrating neoplasm. Ideally, MRI without and with contrast would be performed. If the patient cannot undergo MRI, abdominal CT would be recommended.   Electronically Signed   By: Roxy Horseman M.Bennett.   On: 01/28/2013 21:45    Medications:  Scheduled: . antiseptic oral rinse  15 mL Mouth Rinse BID  . cefTRIAXone (ROCEPHIN)  IV  1 g Intravenous Daily  . insulin  aspart  0-20 Units Subcutaneous Q4H   Continuous: . sodium chloride 50 mL/hr at 01/29/13 0624  . octreotide (SANDOSTATIN) infusion 25 mcg/hr (01/28/13 1735)  . pantoprozole (PROTONIX) infusion 8 mg/hr (01/28/13 2215)    Assessment/Plan: 1) S/p esophageal variceal bleed. 2) Cirrhosis - ? Etiology   I discussed the findings with the patient and her husband.  She does not have a history of ETOH abuse or illicit drug use.  No blood transfusions before 1990.  The patient has a history of DM and NASH cirrhosis could be an etiology, however, she is rather young to have this presentation.  The HBV and HCV serologies are pending.  If those results are negative then further evaluation for other disease will be pursued with the exception of hemochromatosis.  Blood work with the iron panel and ferritin will be inaccurate with her blood transfusion.  Plan: 1) Follow HGB. 2) Continue Octreotide. 3) Do not transfuse above 10 g/dL. 4) Ceftriaxone 1 gram QD.   LOS: 1 day   Selena Bennett 01/29/2013, 7:28 AM

## 2013-01-29 NOTE — Progress Notes (Signed)
eLink Physician-Brief Progress Note Patient Name: Selena Bennett DOB: 25-Jan-1966 MRN: [id number]  Date of Service  01/29/2013   HPI/Events of Note   N/V  eICU Interventions  Zofran      YACOUB,WESAM 01/29/2013, 2:17 AM

## 2013-01-29 NOTE — Progress Notes (Signed)
eLink Physician-Brief Progress Note Patient Name: MODEAN MCCULLUM DOB: Apr 29, 1965 MRN: [id number]  Date of Service  01/29/2013   HPI/Events of Note     eICU Interventions  Adjusted zofran and fentanyl dosing at pt's request   Intervention Category Minor Interventions: Routine modifications to care plan (e.g. PRN medications for pain, fever)  Crislyn Willbanks S. 01/29/2013, 4:55 PM

## 2013-01-29 NOTE — H&P (Signed)
Name: Selena Bennett MRN: [id number] DOB: 06-Mar-1965    ADMISSION DATE:  01/28/2013  REFERRING MD :  Randell Loop [facility] PRIMARY SERVICE: PCCM  CHIEF COMPLAINT:  GIB  BRIEF PATIENT DESCRIPTION: 47 y/o F with PMH of HTN, DM, GERD admitted to [facility] on 12/30 after acute syncopal episode with GIB.  Hgb 4.2 on presentation to [facility].    SIGNIFICANT EVENTS / STUDIES:  12/30 - Admit with syncope, GIB, Hgb 4.2.  Hypertensive.  12/30 EGD> esophageal varices, 7 bands placed  LINES / TUBES: L IJ TLC 12/30>>>  CULTURES:   ANTIBIOTICS: 12/31 ceftriaxone (GI Bleed, cirrhosis) >>  SUBJECTIVE:  no bleeding overnight, some mild chest pain, nausea, wants to eat   VITAL SIGNS: Temp:  [98.1 F (36.7 C)-99.7 F (37.6 C)] 98.1 F (36.7 C) (12/31 0746) Pulse Rate:  [75-123] 85 (12/31 0700) Resp:  [10-25] 15 (12/31 0700) BP: (97-148)/(46-94) 127/77 mmHg (12/31 0700) SpO2:  [99 %-100 %] 100 % (12/31 0700) Weight:  [93.8 kg (206 lb 12.7 oz)] 93.8 kg (206 lb 12.7 oz) (12/30 1055)  HEMODYNAMICS:    VENTILATOR SETTINGS:    INTAKE / OUTPUT: Intake/Output     12/30 0701 - 12/31 0700 12/31 0701 - 01/01 0700   I.V. (mL/kg) 1600 (17.1)    Blood 665.5    Total Intake(mL/kg) 2265.5 (24.2)    Urine (mL/kg/hr) 1850    Total Output 1850     Net +415.5            PHYSICAL EXAMINATION: General:  Nauseated, fatigued HEENT: NCAT, PERRL,  PULM: CTA B CV: RRR, no mgr AB: BS+, soft, nontender Ext: warm, acyanotic Neuro: A&Ox4, MAEW  LABS:  CBC  Recent Labs Lab 01/28/13 1210 01/28/13 1930 01/29/13 0220  WBC  --  13.2* 15.1*  HGB 5.9* 8.3* 8.1*  HCT 18.1* 24.9* 24.9*  PLT  --  126* 153   Coag's No results found for this basename: APTT, INR,  in the last 168 hours BMET  Recent Labs Lab 01/28/13 1210 01/29/13 0220  NA 137 143  K 4.1 4.4  CL 105 111  CO2 21 23  BUN 27* 27*  CREATININE 0.99 1.05  GLUCOSE 395* 135*   Electrolytes  Recent Labs Lab 01/28/13 1210 01/29/13 0220   CALCIUM 7.5* 7.5*   Sepsis Markers No results found for this basename: LATICACIDVEN, PROCALCITON, O2SATVEN,  in the last 168 hours ABG No results found for this basename: PHART, PCO2ART, PO2ART,  in the last 168 hours Liver Enzymes  Recent Labs Lab 01/28/13 1210 01/29/13 0220  AST 14 16  ALT 10 11  ALKPHOS 82 76  BILITOT 0.6 0.9  ALBUMIN 2.3* 2.3*   Cardiac Enzymes  Recent Labs Lab 01/28/13 1300 01/28/13 1930 01/29/13 0130  TROPONINI <0.30 <0.30 <0.30   Glucose  Recent Labs Lab 01/28/13 1047 01/28/13 1223 01/28/13 1704 01/28/13 1909 01/28/13 2317 01/29/13 0343  GLUCAP 421* 434* 268* 232* 120* 153*    Imaging    ASSESSMENT / PLAN:  GASTROINTESTINAL A:   GIB - stable, non-bleeding varices noted on 12/30 EGD, s/p banding Cirrhosis - presumed, new diagnosis, hepatitis serologies negative Hepatic hypodensity seen on ultrasound P:   -cirrhosis work up per GI -repeat H/H 12/31 -Low Na diet  HEMATOLOGIC A:   Anemia - blood loss DVT Prophylaxis  P:  -repeat h/h later today -goal transfusion Hgb <7 -SCD's -no heparin or ASA given GIB  CARDIOVASCULAR A:  Chest pain, no evidence of ischemia Syncope HTN  baseline P:  -assess EKG -hold home lisinopril for now  RENAL A:   No acute issues P:   -trend BMP -gentle hydration / NS @ 50  PULMONARY A: Atelectasis P:   -pulmonary hygiene -oxygen PRN to support sats > 93% -incentive spiro  INFECTIOUS A:   No evidence of acute infectious process P:   -monitor fever curve / leukocytosis  ENDOCRINE A:   Hyperglycemia   P:   -SSI, resistant scale -hold home metformin  NEUROLOGIC A:   Anxiety  P:   -supportive care  Dispo: SDU, TRH service, PCCM to sign off  Yolonda Kida PCCM Pager: [phone] Cell: [phone] If no response, call [phone]    01/29/2013, 8:11 AM

## 2013-01-29 NOTE — Progress Notes (Signed)
Pt. Admitted to room 3s09; vss; patient having epigastric pain 10/10; Fentanyl given by former RN, Riki Rusk approximately an hour ago.  Oriented to room; call light within reach.  Will continue to monitor.  Vivi Martens RN

## 2013-01-30 ENCOUNTER — Encounter (HOSPITAL_COMMUNITY): Payer: Self-pay | Admitting: Internal Medicine

## 2013-01-30 DIAGNOSIS — K7581 Nonalcoholic steatohepatitis (NASH): Secondary | ICD-10-CM

## 2013-01-30 DIAGNOSIS — I8501 Esophageal varices with bleeding: Secondary | ICD-10-CM

## 2013-01-30 DIAGNOSIS — K7460 Unspecified cirrhosis of liver: Secondary | ICD-10-CM | POA: Diagnosis present

## 2013-01-30 DIAGNOSIS — E119 Type 2 diabetes mellitus without complications: Secondary | ICD-10-CM | POA: Diagnosis present

## 2013-01-30 DIAGNOSIS — K7469 Other cirrhosis of liver: Secondary | ICD-10-CM | POA: Diagnosis present

## 2013-01-30 DIAGNOSIS — I10 Essential (primary) hypertension: Secondary | ICD-10-CM | POA: Diagnosis not present

## 2013-01-30 DIAGNOSIS — D62 Acute posthemorrhagic anemia: Secondary | ICD-10-CM | POA: Diagnosis present

## 2013-01-30 HISTORY — DX: Esophageal varices with bleeding: I85.01

## 2013-01-30 LAB — HEMOGLOBIN AND HEMATOCRIT, BLOOD
HCT: 23.5 % — ABNORMAL LOW (ref 36.0–46.0)
HEMOGLOBIN: 7.5 g/dL — AB (ref 12.0–15.0)

## 2013-01-30 LAB — GLUCOSE, CAPILLARY
GLUCOSE-CAPILLARY: 154 mg/dL — AB (ref 70–99)
GLUCOSE-CAPILLARY: 155 mg/dL — AB (ref 70–99)
GLUCOSE-CAPILLARY: 176 mg/dL — AB (ref 70–99)
Glucose-Capillary: 147 mg/dL — ABNORMAL HIGH (ref 70–99)
Glucose-Capillary: 140 mg/dL — ABNORMAL HIGH (ref 70–99)
Glucose-Capillary: 192 mg/dL — ABNORMAL HIGH (ref 70–99)
Glucose-Capillary: 195 mg/dL — ABNORMAL HIGH (ref 70–99)

## 2013-01-30 MED ORDER — INFLUENZA VAC SPLIT QUAD 0.5 ML IM SUSP
0.5000 mL | INTRAMUSCULAR | Status: DC
  Filled 2013-01-30 (×3): qty 0.5

## 2013-01-30 MED ORDER — PNEUMOCOCCAL VAC POLYVALENT 25 MCG/0.5ML IJ INJ
0.5000 mL | INJECTION | INTRAMUSCULAR | Status: AC
  Administered 2013-01-31: 0.5 mL via INTRAMUSCULAR
  Filled 2013-01-30: qty 0.5

## 2013-01-30 NOTE — Progress Notes (Addendum)
    Progress Note - covering for Dr. Benson Norway  Patient Name: Alvia Grove Date of Encounter: 01/30/2013, 12:29 PM    Subjective  No further bleeding (melena or hematemesis) Is having some mild pain with swallowing Overall better   Objective    Physical Exam: Filed Vitals:   01/30/13 1138  BP: 142/79  Pulse: 70  Temp: 98.5 F (36.9 C)  Resp: 8   General: obese Lungs: clear Heart:  RRR S1S2 Abdomen: soft and nontender      Labs:  Recent Labs  01/28/13 1210 01/29/13 0220  NA 137 143  K 4.1 4.4  CL 105 111  CO2 21 23  GLUCOSE 395* 135*  BUN 27* 27*  CREATININE 0.99 1.05  CALCIUM 7.5* 7.5*    Recent Labs  01/28/13 1210 01/29/13 0220  AST 14 16  ALT 10 11  ALKPHOS 82 76  BILITOT 0.6 0.9  PROT 5.3* 5.5*  ALBUMIN 2.3* 2.3*     Recent Labs  01/28/13 1930 01/29/13 0220 01/29/13 1649  WBC 13.2* 15.1*  --   HGB 8.3* 8.1* 8.0*  HCT 24.9* 24.9* 24.5*  MCV 80.8 80.8  --   PLT 126* 153  --       Radiology/Studies:   US Abdomen Limited Ruq  01/28/2013   CLINICAL DATA:  GI bleed. History of cholecystectomy. Question cirrhosis.  EXAM: US ABDOMEN LIMITED - RIGHT UPPER QUADRANT  COMPARISON:  None.  FINDINGS: Gallbladder  Surgically absent.  Common bile duct  Diameter: 6.5 mm.  No intraductal calculi identified.  Liver:  The liver is diffusely heterogeneous in echotexture and incompletely visualized. There is a central echogenic lesion measuring approximately 3.2 x 2.1 x 1.6 cm. The portal vein is patent with hepatopetal flow. No ascites is documented.  IMPRESSION: 1. No biliary dilatation status post cholecystectomy. 2. Diffusely heterogeneous hepatic parenchyma with central echogenic lesion. These findings are nonspecific and could be secondary to heterogeneous steatosis. However, with the clinical suspicion of cirrhosis, further evaluation is recommended to exclude infiltrating neoplasm. Ideally, MRI without and with contrast would be performed. If the  patient cannot undergo MRI, abdominal CT would be recommended.   Electronically Signed   By: Camie Patience M.D.   On: 01/28/2013 21:45     Assessment and Plan  1) Esophageal varices with hemorrhage - improved after banding 2) Odynophagia - mild after esophageal banding 3) acute blood loss anemia - improved after banding and transfusion 4) Liver lesion on [place] - not definitively characterized   1) Continue supportive care including octreotide - likely dc tomorrow 2) Ultimately CT vs MRI - has claustrophobia so may need triple phase CT abd (liver) over MR. Not urgent so wait for now - ? If could do open MR  3) Will need flu and pneumonia vaccines if has not had - I ordered these -  HBV vaccination will be needed as neg HB S ab. HAV Ab total ordered to see if will need HAV vaccination  Gatha Mayer, MD, [facility] [phone] (pager) 01/30/2013 12:34 PM

## 2013-01-30 NOTE — Progress Notes (Addendum)
[facility] Progress Note [place] TEAM 1 - Stepdown/ICU TEAM   Selena Bennett QMV:[id number] DOB: 1965-10-13 DOA: 01/28/2013 PCP: Christian Mate, MD    Brief narrative: 48 y/o feamle with DM, HTN, GERD presented to [facility] with syncope due to severe hematemesis (found in a poor of blood) and found to have a Hgb of 4.2. She was transferred to [facility] for a GI work up. She underwent banding of esophageal varices the same day by Dr Benson Norway. During this admission she has been discovered to have cirrhosis, the cause of which is still undetermined. She and her husband agree that she has never abused Alcohol.    Subjective: Feels weak. Can feel pain in her chest "where the bands were placed". Does not feel pain with swallowing. Has not had a BM since admission. No further vomiting.   Assessment/Plan: Principal Problem:   GIB / Esophageal varices with hemorrhage - management per GI  - on Octreotide infusion - Rocephin for SBP prophylaxis?- no sign of ascites (clinically or on ultrasound) therefore with discontinue this  Active Problems:    Cirrhosis of liver without mention of alcohol - work up per GI- hepatitis serologies negative  Liver nodule - will need work up with either MRI or triple phase CT - AFP within normal range    Acute blood loss anemia - cont to replace blood to keep Hgb > 7    HTN (hypertension) - holding Lisinopril due to initial hypotension    DM (diabetes mellitus) - holding Metformin- cont sliding scale insulin   Code Status: Full Family Communication: with husband Disposition Plan: to be determined  Consultants: GI  Procedures: 12/30- EGD- 7 band placed for esophageal varices  Antibiotics: Rocephin 12/31>>  DVT prophylaxis: SCDs  Objective: Blood pressure 142/79, pulse 70, temperature 98.5 F (36.9 C), temperature source Oral, resp. rate 8, height 5\' 5"  (1.651 m), weight 96.2 kg (212 lb 1.3 oz), last menstrual period 01/13/2013, SpO2  100.00%.  Intake/Output Summary (Last 24 hours) at 01/30/13 1704 Last data filed at 01/30/13 1200  Gross per 24 hour  Intake   2445 ml  Output      0 ml  Net   2445 ml     Exam: General: No acute respiratory distress Lungs: Clear to auscultation bilaterally without wheezes or crackles Cardiovascular: Regular rate and rhythm without murmur gallop or rub normal S1 and S2 Abdomen: Nontender, nondistended, soft, bowel sounds positive, no rebound, no ascites, no appreciable mass Extremities: No significant cyanosis, clubbing, or edema bilateral lower extremities  Data Reviewed: Basic Metabolic Panel:  Recent Labs Lab 01/28/13 1210 01/29/13 0220  NA 137 143  K 4.1 4.4  CL 105 111  CO2 21 23  GLUCOSE 395* 135*  BUN 27* 27*  CREATININE 0.99 1.05  CALCIUM 7.5* 7.5*   Liver Function Tests:  Recent Labs Lab 01/28/13 1210 01/29/13 0220  AST 14 16  ALT 10 11  ALKPHOS 82 76  BILITOT 0.6 0.9  PROT 5.3* 5.5*  ALBUMIN 2.3* 2.3*   No results found for this basename: LIPASE, AMYLASE,  in the last 168 hours No results found for this basename: AMMONIA,  in the last 168 hours CBC:  Recent Labs Lab 01/28/13 1210 01/28/13 1930 01/29/13 0220 01/29/13 1649 01/30/13 1400  WBC  --  13.2* 15.1*  --   --   HGB 5.9* 8.3* 8.1* 8.0* 7.5*  HCT 18.1* 24.9* 24.9* 24.5* 23.5*  MCV  --  80.8 80.8  --   --  PLT  --  126* 153  --   --    Cardiac Enzymes:  Recent Labs Lab 01/28/13 1300 01/28/13 1930 01/29/13 0130  TROPONINI <0.30 <0.30 <0.30   BNP (last 3 results) No results found for this basename: PROBNP,  in the last 8760 hours CBG:  Recent Labs Lab 01/29/13 2014 01/30/13 0036 01/30/13 0447 01/30/13 0930 01/30/13 1151  GLUCAP 103* 147* 154* 140* 155*    Recent Results (from the past 240 hour(s))  MRSA PCR SCREENING     Status: None   Collection Time    01/28/13 11:00 AM      Result Value Range Status   MRSA by PCR NEGATIVE  NEGATIVE Final   Comment:             The GeneXpert MRSA Assay (FDA     approved for NASAL specimens     only), is one component of a     comprehensive MRSA colonization     surveillance program. It is not     intended to diagnose MRSA     infection nor to guide or     monitor treatment for     MRSA infections.     Studies:  Recent x-ray studies have been reviewed in detail by the Attending Physician  Scheduled Meds:  Scheduled Meds: . antiseptic oral rinse  15 mL Mouth Rinse BID  . cefTRIAXone (ROCEPHIN)  IV  1 g Intravenous Daily  . [START ON 01/31/2013] influenza vac split quadrivalent PF  0.5 mL Intramuscular Tomorrow-1000  . insulin aspart  0-20 Units Subcutaneous Q4H  . [START ON 01/31/2013] pneumococcal 23 valent vaccine  0.5 mL Intramuscular Tomorrow-1000   Continuous Infusions: . sodium chloride 50 mL/hr at 01/29/13 1000  . octreotide (SANDOSTATIN) infusion 25 mcg/hr (01/30/13 0745)  . pantoprozole (PROTONIX) infusion 8 mg/hr (01/30/13 0747)    Time spent on care of this patient: 64 min   [place], MD  [facility] Office  [phone] Pager - Text Page per Amion as per below:  On-Call/Text Page:      [email or web address]      password [email or web address]  If 7PM-7AM, please contact night-coverage [email or web address] Password [email or web address] 01/30/2013, 5:04 PM   LOS: 2 days

## 2013-01-31 DIAGNOSIS — K7460 Unspecified cirrhosis of liver: Principal | ICD-10-CM

## 2013-01-31 DIAGNOSIS — K922 Gastrointestinal hemorrhage, unspecified: Secondary | ICD-10-CM

## 2013-01-31 DIAGNOSIS — E119 Type 2 diabetes mellitus without complications: Secondary | ICD-10-CM

## 2013-01-31 DIAGNOSIS — D62 Acute posthemorrhagic anemia: Secondary | ICD-10-CM

## 2013-01-31 LAB — GLUCOSE, CAPILLARY
Glucose-Capillary: 148 mg/dL — ABNORMAL HIGH (ref 70–99)
Glucose-Capillary: 150 mg/dL — ABNORMAL HIGH (ref 70–99)
Glucose-Capillary: 155 mg/dL — ABNORMAL HIGH (ref 70–99)
Glucose-Capillary: 181 mg/dL — ABNORMAL HIGH (ref 70–99)
Glucose-Capillary: 211 mg/dL — ABNORMAL HIGH (ref 70–99)
Glucose-Capillary: 331 mg/dL — ABNORMAL HIGH (ref 70–99)

## 2013-01-31 LAB — HEMOGLOBIN AND HEMATOCRIT, BLOOD
HCT: 23.8 % — ABNORMAL LOW (ref 36.0–46.0)
Hemoglobin: 7.7 g/dL — ABNORMAL LOW (ref 12.0–15.0)

## 2013-01-31 LAB — BASIC METABOLIC PANEL
BUN: 12 mg/dL (ref 6–23)
CO2: 23 meq/L (ref 19–32)
Calcium: 7.4 mg/dL — ABNORMAL LOW (ref 8.4–10.5)
Chloride: 109 mEq/L (ref 96–112)
Creatinine, Ser: 1.05 mg/dL (ref 0.50–1.10)
GFR calc Af Amer: 73 mL/min — ABNORMAL LOW (ref >90)
GFR calc non Af Amer: 63 mL/min — ABNORMAL LOW (ref >90)
GLUCOSE: 180 mg/dL — AB (ref 70–99)
POTASSIUM: 3.8 meq/L (ref 3.7–5.3)
SODIUM: 141 meq/L (ref 137–147)

## 2013-01-31 LAB — CBC
HCT: 21.5 % — ABNORMAL LOW (ref 36.0–46.0)
HEMOGLOBIN: 7 g/dL — AB (ref 12.0–15.0)
MCH: 27.1 pg (ref 26.0–34.0)
MCHC: 32.6 g/dL (ref 30.0–36.0)
MCV: 83.3 fL (ref 78.0–100.0)
Platelets: 113 10*3/uL — ABNORMAL LOW (ref 150–400)
RBC: 2.58 MIL/uL — ABNORMAL LOW (ref 3.87–5.11)
RDW: 17.1 % — ABNORMAL HIGH (ref 11.5–15.5)
WBC: 8 10*3/uL (ref 4.0–10.5)

## 2013-01-31 LAB — AFP TUMOR MARKER: AFP TUMOR MARKER: 2.2 ng/mL (ref 0.0–8.0)

## 2013-01-31 LAB — HEPATITIS A ANTIBODY, TOTAL: Hep A Total Ab: NONREACTIVE

## 2013-01-31 MED ORDER — LISINOPRIL 5 MG PO TABS
5.0000 mg | ORAL_TABLET | Freq: Every day | ORAL | Status: DC
  Administered 2013-02-01 – 2013-02-03 (×3): 5 mg via ORAL
  Filled 2013-01-31 (×5): qty 1

## 2013-01-31 MED ORDER — PANTOPRAZOLE SODIUM 40 MG PO TBEC: 40.0000 mg | DELAYED_RELEASE_TABLET | Freq: Every day | ORAL | Status: DC

## 2013-01-31 MED ORDER — OXYCODONE-ACETAMINOPHEN 5-325 MG PO TABS
1.0000 | ORAL_TABLET | ORAL | Status: DC | PRN
  Filled 2013-01-31: qty 1

## 2013-01-31 MED ORDER — INSULIN ASPART 100 UNIT/ML ~~LOC~~ SOLN
0.0000 [IU] | Freq: Three times a day (TID) | SUBCUTANEOUS | Status: DC
  Administered 2013-01-31: 3 [IU] via SUBCUTANEOUS
  Administered 2013-01-31 – 2013-02-01 (×2): 7 [IU] via SUBCUTANEOUS
  Administered 2013-02-01: 4 [IU] via SUBCUTANEOUS
  Administered 2013-02-01 – 2013-02-02 (×2): 11 [IU] via SUBCUTANEOUS
  Administered 2013-02-02 (×2): 7 [IU] via SUBCUTANEOUS
  Administered 2013-02-03: 4 [IU] via SUBCUTANEOUS
  Administered 2013-02-03 (×2): 7 [IU] via SUBCUTANEOUS
  Administered 2013-02-04: 15 [IU] via SUBCUTANEOUS
  Administered 2013-02-04: 11 [IU] via SUBCUTANEOUS
  Administered 2013-02-04: 4 [IU] via SUBCUTANEOUS
  Administered 2013-02-05: 15 [IU] via SUBCUTANEOUS

## 2013-01-31 MED ORDER — OXYCODONE HCL 5 MG/5ML PO SOLN
5.0000 mg | ORAL | Status: DC | PRN
  Administered 2013-01-31 – 2013-02-03 (×9): 5 mg via ORAL
  Filled 2013-01-31 (×8): qty 5

## 2013-01-31 MED ORDER — DEXTROSE 5 % IV SOLN
1.0000 g | INTRAVENOUS | Status: DC
  Administered 2013-01-31 – 2013-02-01 (×2): 1 g via INTRAVENOUS
  Filled 2013-01-31 (×2): qty 10

## 2013-01-31 NOTE — Progress Notes (Signed)
Notified dr hung re: had iv team come with ultrasound light and were unsuccessful x1. Pt refused to have them try again. Therefore ct scan won't be able to do.

## 2013-01-31 NOTE — Progress Notes (Addendum)
[facility] Progress Note [place] TEAM 1 - Stepdown/ICU TEAM   Selena Bennett ELF:[id number] DOB: [birth year] DOA: 01/28/2013 PCP: Christian Mate, MD    Brief narrative: 48 y/o feamle with DM, HTN, GERD presented to [facility] with syncope due to severe hematemesis (found in a poor of blood) and found to have a Hgb of 4.2. She was transferred to [facility] for a GI work up. She underwent banding of esophageal varices the same day by Dr Benson Norway. During this admission she has been discovered to have cirrhosis, the cause of which is still undetermined. She and her husband agree that she has never abused Alcohol.    Subjective: Still c/o some substernal pain where "band were placed". Tolerating diet  Assessment/Plan: Principal Problem:   GIB / Esophageal varices with hemorrhage - s/p banding on 12/30 - d/c Octreotide infusion today - Protonix infusion expired- EGD did not assess gastric antrum and therofore not certain if she has gastritis or PUD and warrents PPIs- will d/c for now - Rocephin for SBP prophylaxis?- no sign of ascites (clinically or on ultrasound) therefore with discontinue this  Active Problems:    Cirrhosis of liver without mention of alcohol - work up per GI- hepatitis serologies negative  Liver nodule -  triple phase CT to further assess it today - AFP within normal range    Acute blood loss anemia - cont to replace blood to keep Hgb > 7    HTN (hypertension) - holding Lisinopril due to initial hypotension - can resume today    DM (diabetes mellitus) - holding Metformin- cont sliding scale insulin   Code Status: Full Family Communication: with husband on 1/1 Disposition Plan: likely home  Consultants: GI  Procedures: 12/30- EGD- 7 band placed for esophageal varices  Antibiotics: Rocephin 12/31>>  DVT prophylaxis: SCDs  Objective: Blood pressure 148/67, pulse 65, temperature 98.2 F (36.8 C), temperature source Oral, resp. rate 13, height 5'  5" (1.651 m), weight 99.1 kg (218 lb 7.6 oz), last menstrual period 01/13/2013, SpO2 100.00%.  Intake/Output Summary (Last 24 hours) at 01/31/13 1136 Last data filed at 01/31/13 0900  Gross per 24 hour  Intake 2472.83 ml  Output      0 ml  Net 2472.83 ml     Exam: General: No acute respiratory distress Lungs: Clear to auscultation bilaterally without wheezes or crackles Cardiovascular: Regular rate and rhythm without murmur gallop or rub normal S1 and S2 Abdomen: Nontender, nondistended, soft, bowel sounds positive, no rebound, no ascites, no appreciable mass Extremities: No significant cyanosis, clubbing, or edema bilateral lower extremities  Data Reviewed: Basic Metabolic Panel:  Recent Labs Lab 01/28/13 1210 01/29/13 0220 01/31/13 0450  NA 137 143 141  K 4.1 4.4 3.8  CL 105 111 109  CO2 21 23 23   GLUCOSE 395* 135* 180*  BUN 27* 27* 12  CREATININE 0.99 1.05 1.05  CALCIUM 7.5* 7.5* 7.4*   Liver Function Tests:  Recent Labs Lab 01/28/13 1210 01/29/13 0220  AST 14 16  ALT 10 11  ALKPHOS 82 76  BILITOT 0.6 0.9  PROT 5.3* 5.5*  ALBUMIN 2.3* 2.3*   No results found for this basename: LIPASE, AMYLASE,  in the last 168 hours No results found for this basename: AMMONIA,  in the last 168 hours CBC:  Recent Labs Lab 01/28/13 1210 01/28/13 1930 01/29/13 0220 01/29/13 1649 01/30/13 1400 01/31/13 0450  WBC  --  13.2* 15.1*  --   --  8.0  HGB  5.9* 8.3* 8.1* 8.0* 7.5* 7.0*  HCT 18.1* 24.9* 24.9* 24.5* 23.5* 21.5*  MCV  --  80.8 80.8  --   --  83.3  PLT  --  126* 153  --   --  113*   Cardiac Enzymes:  Recent Labs Lab 01/28/13 1300 01/28/13 1930 01/29/13 0130  TROPONINI <0.30 <0.30 <0.30   BNP (last 3 results) No results found for this basename: PROBNP,  in the last 8760 hours CBG:  Recent Labs Lab 01/30/13 1718 01/30/13 2036 01/31/13 0022 01/31/13 0412 01/31/13 0825  GLUCAP 195* 176* 148* 181* 155*    Recent Results (from the past 240 hour(s))   MRSA PCR SCREENING     Status: None   Collection Time    01/28/13 11:00 AM      Result Value Range Status   MRSA by PCR NEGATIVE  NEGATIVE Final   Comment:            The GeneXpert MRSA Assay (FDA     approved for NASAL specimens     only), is one component of a     comprehensive MRSA colonization     surveillance program. It is not     intended to diagnose MRSA     infection nor to guide or     monitor treatment for     MRSA infections.     Studies:  Recent x-ray studies have been reviewed in detail by the Attending Physician  Scheduled Meds:  Scheduled Meds: . antiseptic oral rinse  15 mL Mouth Rinse BID  . cefTRIAXone (ROCEPHIN)  IV  1 g Intravenous Q24H  . influenza vac split quadrivalent PF  0.5 mL Intramuscular Tomorrow-1000  . insulin aspart  0-20 Units Subcutaneous Q4H  . pantoprazole  40 mg Oral Daily   Continuous Infusions:    Time spent on care of this patient: 30 min   Debbe Odea, MD  [facility] Office  [phone] Pager - Text Page per Shea Evans as per below:  On-Call/Text Page:      [email or web address]      password [email or web address]  If 7PM-7AM, please contact night-coverage [email or web address] Password [email or web address] 01/31/2013, 11:36 AM   LOS: 3 days

## 2013-01-31 NOTE — Progress Notes (Addendum)
Subjective: No acute events.  Some chest pain post  Banding.  Objective: Vital signs in last 24 hours: Temp:  [98.2 F (36.8 C)-98.5 F (36.9 C)] 98.2 F (36.8 C) (01/02 0722) Pulse Rate:  [52-70] 65 (01/02 0722) Resp:  [8-16] 13 (01/02 0722) BP: (135-152)/(63-79) 148/67 mmHg (01/02 0722) SpO2:  [97 %-100 %] 100 % (01/02 0722) Weight:  [218 lb 7.6 oz (99.1 kg)] 218 lb 7.6 oz (99.1 kg) (01/02 0500) Last BM Date: 01/27/13  Intake/Output from previous day: 01/01 0701 - 01/02 0700 In: 2760.3 [P.O.:600; I.V.:2110.3; IV Piggyback:50] Out: -  Intake/Output this shift: Total I/O In: 240 [P.O.:240] Out: -   General appearance: alert and no distress GI: soft, non-tender; bowel sounds normal; no masses,  no organomegaly  Lab Results:  Recent Labs  01/28/13 1930 01/29/13 0220 01/29/13 1649 01/30/13 1400 01/31/13 0450  WBC 13.2* 15.1*  --   --  8.0  HGB 8.3* 8.1* 8.0* 7.5* 7.0*  HCT 24.9* 24.9* 24.5* 23.5* 21.5*  PLT 126* 153  --   --  113*   BMET  Recent Labs  01/28/13 1210 01/29/13 0220 01/31/13 0450  NA 137 143 141  K 4.1 4.4 3.8  CL 105 111 109  CO2 21 23 23   GLUCOSE 395* 135* 180*  BUN 27* 27* 12  CREATININE 0.99 1.05 1.05  CALCIUM 7.5* 7.5* 7.4*   LFT  Recent Labs  01/29/13 0220  PROT 5.5*  ALBUMIN 2.3*  AST 16  ALT 11  ALKPHOS 76  BILITOT 0.9  BILIDIR <0.2  IBILI NOT CALCULATED   PT/INR No results found for this basename: LABPROT, INR,  in the last 72 hours Hepatitis Panel  Recent Labs  01/28/13 1930  [place]   C-Diff No results found for this basename: CDIFFTOX,  in the last 72 hours Fecal Lactopherrin No results found for this basename: FECLLACTOFRN,  in the last 72 hours  Studies/Results: Dg Chest Port 1 View  01/29/2013   CLINICAL DATA:  Mid chest pain, shortness of breath  EXAM: PORTABLE CHEST - 1 VIEW  COMPARISON:  01/28/2013  FINDINGS: Grossly unchanged cardiac silhouette and mediastinal contours given  persistently reduced lung volumes. Stable positioning of support apparatus. No change to slight worsening of bilateral heterogeneous opacities, left greater than right. Mild pulmonary venous congestion without frank evidence of edema. There is a minimal amount of pleural parenchymal thickening about the right minor fissure, unchanged. No pleural effusion or pneumothorax. Unchanged bones.  IMPRESSION: 1. Persistently reduced lung volumes with no change to slight worsening of perihilar opacities, left greater than right, likely atelectasis. 2. Borderline cardiomegaly and findings suggestive of pulmonary venous congestion. No definite evidence of edema.   Electronically Signed   By: Sandi Mariscal M.D.   On: 01/29/2013 09:04    Medications:  Scheduled: . antiseptic oral rinse  15 mL Mouth Rinse BID  . cefTRIAXone (ROCEPHIN)  IV  1 g Intravenous Q24H  . influenza vac split quadrivalent PF  0.5 mL Intramuscular Tomorrow-1000  . insulin aspart  0-20 Units Subcutaneous Q4H  . pneumococcal 23 valent vaccine  0.5 mL Intramuscular Tomorrow-1000   Continuous: . octreotide (SANDOSTATIN) infusion 25 mcg/hr (01/31/13 0600)  . pantoprozole (PROTONIX) infusion 8 mg/hr (01/31/13 0600)    Assessment/Plan: 1) S/p esophageal variceal bleed. 2) Fundic varices. 3) Cirrhosis - ? Etiology. 4) ? Lesion in the liver.   I do not have a source of her cirrhosis and I will order further serologies to  expand out her work up.  Additionally, I reviewed the ultrasound of her liver and there is a possible lesion.  She has claustrophobia, but I think a triple phase CT scan will be adequate.  Plan: 1) Check ANA, ASMA, AMA, Ceruloplasmi, Alpha-1-Antitrypsin, and AFP. 2) Triple phase CT scan.    LOS: 3 days   Junior Kenedy D 01/31/2013, 8:05 AM  ADDENDUM:  Ceftriaxone was restarted.  In the setting of variceal bleeding there is a higher rate of infection, overall.  Patient's need to be treated for a total of 7 days.  She can  be converted over to orals upon discharge.

## 2013-02-01 DIAGNOSIS — R1310 Dysphagia, unspecified: Secondary | ICD-10-CM | POA: Diagnosis not present

## 2013-02-01 DIAGNOSIS — K769 Liver disease, unspecified: Secondary | ICD-10-CM | POA: Diagnosis present

## 2013-02-01 LAB — ALPHA-1-ANTITRYPSIN: A1 ANTITRYPSIN SER: 150 mg/dL (ref 90–200)

## 2013-02-01 LAB — CERULOPLASMIN: CERULOPLASMIN: 40 mg/dL (ref 20–60)

## 2013-02-01 LAB — CBC
HEMATOCRIT: 22.5 % — AB (ref 36.0–46.0)
HEMOGLOBIN: 7.2 g/dL — AB (ref 12.0–15.0)
MCH: 27 pg (ref 26.0–34.0)
MCHC: 32 g/dL (ref 30.0–36.0)
MCV: 84.3 fL (ref 78.0–100.0)
Platelets: 93 10*3/uL — ABNORMAL LOW (ref 150–400)
RBC: 2.67 MIL/uL — AB (ref 3.87–5.11)
RDW: 17.2 % — ABNORMAL HIGH (ref 11.5–15.5)
WBC: 4.5 10*3/uL (ref 4.0–10.5)

## 2013-02-01 LAB — GLUCOSE, CAPILLARY
GLUCOSE-CAPILLARY: 151 mg/dL — AB (ref 70–99)
GLUCOSE-CAPILLARY: 234 mg/dL — AB (ref 70–99)
Glucose-Capillary: 265 mg/dL — ABNORMAL HIGH (ref 70–99)

## 2013-02-01 MED ORDER — HEPATITIS B VAC RECOMBINANT 5 MCG/0.5ML IJ SUSP
10.0000 ug | Freq: Once | INTRAMUSCULAR | Status: DC
  Filled 2013-02-01: qty 1

## 2013-02-01 MED ORDER — OXYCODONE HCL 5 MG/5ML PO SOLN
ORAL | Status: AC
  Filled 2013-02-01: qty 5

## 2013-02-01 MED ORDER — HEPATITIS A VACCINE 1440 EL U/ML IM SUSP
1.0000 mL | Freq: Once | INTRAMUSCULAR | Status: DC
Start: 2013-02-01 — End: 2013-02-01
  Filled 2013-02-01: qty 1

## 2013-02-01 MED ORDER — LEVOFLOXACIN IN D5W 750 MG/150ML IV SOLN
750.0000 mg | INTRAVENOUS | Status: DC
  Administered 2013-02-01 – 2013-02-02 (×2): 750 mg via INTRAVENOUS
  Filled 2013-02-01 (×2): qty 150

## 2013-02-01 MED ORDER — PANTOPRAZOLE SODIUM 40 MG PO TBEC
40.0000 mg | DELAYED_RELEASE_TABLET | Freq: Every day | ORAL | Status: DC
  Administered 2013-02-01 – 2013-02-05 (×5): 40 mg via ORAL
  Filled 2013-02-01 (×5): qty 1

## 2013-02-01 MED ORDER — SUCRALFATE 1 GM/10ML PO SUSP
1.0000 g | Freq: Four times a day (QID) | ORAL | Status: DC
Start: 2013-02-01 — End: 2013-02-05
  Administered 2013-02-01 – 2013-02-05 (×15): 1 g via ORAL
  Filled 2013-02-01 (×22): qty 10

## 2013-02-01 MED ORDER — HEPATITIS B VAC RECOMBINANT 5 MCG/0.5ML IJ SUSP: 0.5000 mL | Freq: Once | INTRAMUSCULAR | Status: DC

## 2013-02-01 NOTE — Progress Notes (Signed)
ANTIBIOTIC CONSULT NOTE - INITIAL  Pharmacy Consult for levaquin Indication: empiric GI coverage  No Known Allergies  Patient Measurements: Height: 5\' 5"  (165.1 cm) Weight: 218 lb 7.6 oz (99.1 kg) IBW/kg (Calculated) : 57 Adjusted Body Weight:   Vital Signs: Temp: 98.2 F (36.8 C) (01/03 1137) Temp src: Oral (01/03 1137) BP: 156/76 mmHg (01/03 1137) Pulse Rate: 67 (01/03 1137) Intake/Output from previous day: 01/02 0701 - 01/03 0700 In: 1816.9 [P.O.:1200; I.V.:566.9; IV Piggyback:50] Out: -  Intake/Output from this shift: Total I/O In: 360 [P.O.:360] Out: -   Labs:  Recent Labs  01/31/13 0450 01/31/13 1700 02/01/13 0530  WBC 8.0  --  4.5  HGB 7.0* 7.7* 7.2*  PLT 113*  --  93*  CREATININE 1.05  --   --    Estimated Creatinine Clearance: 78 ml/min (by C-G formula based on Cr of 1.05). No results found for this basename: VANCOTROUGH, Corlis Leak, VANCORANDOM, GENTTROUGH, GENTPEAK, GENTRANDOM, TOBRATROUGH, TOBRAPEAK, TOBRARND, AMIKACINPEAK, AMIKACINTROU, AMIKACIN,  in the last 72 hours   Microbiology: Recent Results (from the past 720 hour(s))  MRSA PCR SCREENING     Status: None   Collection Time    01/28/13 11:00 AM      Result Value Range Status   MRSA by PCR NEGATIVE  NEGATIVE Final   Comment:            The GeneXpert MRSA Assay (FDA     approved for NASAL specimens     only), is one component of a     comprehensive MRSA colonization     surveillance program. It is not     intended to diagnose MRSA     infection nor to guide or     monitor treatment for     MRSA infections.    Medical History: Past Medical History  Diagnosis Date  . Diabetes   . Hypertension   . Esophageal varices with hemorrhage 01/30/2013    Medications:  Anti-infectives   Start     Dose/Rate Route Frequency Ordered Stop   02/01/13 1600  levofloxacin (LEVAQUIN) IVPB 750 mg     750 mg 100 mL/hr over 90 Minutes Intravenous Every 24 hours 02/01/13 1533     01/31/13 0930   cefTRIAXone (ROCEPHIN) 1 g in dextrose 5 % 50 mL IVPB  Status:  Discontinued     1 g 100 mL/hr over 30 Minutes Intravenous Every 24 hours 01/31/13 0640 02/01/13 1528   01/29/13 0800  cefTRIAXone (ROCEPHIN) 1 g in dextrose 5 % 50 mL IVPB  Status:  Discontinued     1 g 100 mL/hr over 30 Minutes Intravenous Daily 01/29/13 4193 01/30/13 1713     Assessment: 25 yof initially admitted with hematemesis and low Hgb. Now s/p banding of varices. Previously on ceftriaxone and now changing to levaquin.   Ceftriaxone 12/31>>1/3 Levaquin 1/3>>  Goal of Therapy:  Eradication of infection  Plan:  1. Levaquin 750mg  IV Q24H 2. F/u renal fxn, C&S, clinical status  Josclyn Rosales, Rande Lawman 02/01/2013,3:33 PM

## 2013-02-01 NOTE — Progress Notes (Signed)
Patient has been transferred to [facility] bed24 via wheelchair. Phone report was called to [place]. Patient and husband aware of the transfer.

## 2013-02-01 NOTE — Progress Notes (Addendum)
[facility] Progress Note [place] TEAM 1 - Stepdown/ICU TEAM   Selena Bennett JJH:[id number] DOB: 08-28-1965 DOA: 01/28/2013 PCP: Christian Mate, MD    Brief narrative: 48 y/o feamle with DM, HTN, GERD presented to [facility] with syncope due to severe hematemesis (found in a poor of blood) and found to have a Hgb of 4.2. She was transferred to [facility] for a GI work up. She underwent banding of esophageal varices the same day by Dr Benson Norway. During this admission she has been discovered to have cirrhosis, the cause of which is still undetermined. She and her husband agree that she has never abused Alcohol.    Subjective: Still c/o some substernal pain where "bands were placed". Tolerating diet  Assessment/Plan: Principal Problem:   GIB / Esophageal varices with hemorrhage - s/p banding on 12/30 - d/c'd Octreotide infusion -cont daily Protonix per GI - Rocephin for empiric coverage for translocation of bacteria during banding-   Active Problems:  Chest pain after banding - Carafate and PPI per GI  Thrombocytopenia - follow - may be due to consumption and will recover soon - can also be due to Rocephin which I will convert to Levaquin for now    Cirrhosis of liver without mention of alcohol - work up per GI- hepatitis serologies negative  Liver nodule -  triple phase CT planned but unable to obtain a 20 gauge IV in first attempt, pt refused further attempts - AFP within normal range - will need outpt CT vs MRI    Acute blood loss anemia - cont to replace blood to keep Hgb > 7    HTN (hypertension) - holding Lisinopril due to initial hypotension - can resume today    DM (diabetes mellitus) - holding Metformin- cont sliding scale insulin   Code Status: Full Family Communication: with husband on 1/1 Disposition Plan: likely home  Consultants: GI  Procedures: 12/30- EGD- 7 band placed for esophageal varices  Antibiotics: Rocephin 12/31>>  DVT  prophylaxis: SCDs  Objective: Blood pressure 156/76, pulse 67, temperature 98.2 F (36.8 C), temperature source Oral, resp. rate 15, height 5\' 5"  (1.651 m), weight 99.1 kg (218 lb 7.6 oz), last menstrual period 01/13/2013, SpO2 97.00%.  Intake/Output Summary (Last 24 hours) at 02/01/13 1522 Last data filed at 02/01/13 1414  Gross per 24 hour  Intake 1354.37 ml  Output      0 ml  Net 1354.37 ml     Exam: General: No acute respiratory distress Lungs: Clear to auscultation bilaterally without wheezes or crackles Cardiovascular: Regular rate and rhythm without murmur gallop or rub normal S1 and S2 Abdomen: Nontender, nondistended, soft, bowel sounds positive, no rebound, no ascites, no appreciable mass Extremities: No significant cyanosis, clubbing, or edema bilateral lower extremities  Data Reviewed: Basic Metabolic Panel:  Recent Labs Lab 01/28/13 1210 01/29/13 0220 01/31/13 0450  NA 137 143 141  K 4.1 4.4 3.8  CL 105 111 109  CO2 21 23 23   GLUCOSE 395* 135* 180*  BUN 27* 27* 12  CREATININE 0.99 1.05 1.05  CALCIUM 7.5* 7.5* 7.4*   Liver Function Tests:  Recent Labs Lab 01/28/13 1210 01/29/13 0220  AST 14 16  ALT 10 11  ALKPHOS 82 76  BILITOT 0.6 0.9  PROT 5.3* 5.5*  ALBUMIN 2.3* 2.3*   No results found for this basename: LIPASE, AMYLASE,  in the last 168 hours No results found for this basename: AMMONIA,  in the last 168 hours CBC:  Recent  Labs Lab 01/28/13 1210 01/28/13 1930 01/29/13 0220 01/29/13 1649 01/30/13 1400 01/31/13 0450 01/31/13 1700 02/01/13 0530  WBC  --  13.2* 15.1*  --   --  8.0  --  4.5  HGB 5.9* 8.3* 8.1* 8.0* 7.5* 7.0* 7.7* 7.2*  HCT 18.1* 24.9* 24.9* 24.5* 23.5* 21.5* 23.8* 22.5*  MCV  --  80.8 80.8  --   --  83.3  --  84.3  PLT  --  126* 153  --   --  113*  --  93*   Cardiac Enzymes:  Recent Labs Lab 01/28/13 1300 01/28/13 1930 01/29/13 0130  TROPONINI <0.30 <0.30 <0.30   BNP (last 3 results) No results found for  this basename: PROBNP,  in the last 8760 hours CBG:  Recent Labs Lab 01/31/13 1142 01/31/13 1547 01/31/13 2137 02/01/13 0746 02/01/13 1136  GLUCAP 150* 211* 331* 265* 151*    Recent Results (from the past 240 hour(s))  MRSA PCR SCREENING     Status: None   Collection Time    01/28/13 11:00 AM      Result Value Range Status   MRSA by PCR NEGATIVE  NEGATIVE Final   Comment:            The GeneXpert MRSA Assay (FDA     approved for NASAL specimens     only), is one component of a     comprehensive MRSA colonization     surveillance program. It is not     intended to diagnose MRSA     infection nor to guide or     monitor treatment for     MRSA infections.     Studies:  Recent x-ray studies have been reviewed in detail by the Attending Physician  Scheduled Meds:  Scheduled Meds: . oxyCODONE      . antiseptic oral rinse  15 mL Mouth Rinse BID  . cefTRIAXone (ROCEPHIN)  IV  1 g Intravenous Q24H  . influenza vac split quadrivalent PF  0.5 mL Intramuscular Tomorrow-1000  . insulin aspart  0-20 Units Subcutaneous TID WC  . lisinopril  5 mg Oral Daily  . pantoprazole  40 mg Oral Q0600  . sucralfate  1 g Oral Q6H   Continuous Infusions:    Time spent on care of this patient: 88 min   Debbe Odea, MD  [facility] Office  [phone] Pager - Text Page per Shea Evans as per below:  On-Call/Text Page:      [email or web address]      password [email or web address]  If 7PM-7AM, please contact night-coverage [email or web address] Password [email or web address] 02/01/2013, 3:22 PM   LOS: 4 days

## 2013-02-01 NOTE — Progress Notes (Signed)
    Progress Note Covering for Dr. Benson Norway  Subjective  still having pain with swallowing   Objective   Vital signs in last 24 hours: Temp:  [98.2 F (36.8 C)-98.8 F (37.1 C)] 98.2 F (36.8 C) (01/03 0751) Pulse Rate:  [67-72] 67 (01/03 0751) Resp:  [12-17] 12 (01/03 0751) BP: (135-153)/(69-78) 144/78 mmHg (01/03 0751) SpO2:  [91 %-100 %] 91 % (01/03 0751) Last BM Date: 01/31/13 General:    Pleasant black female in NAD Abdomen:  Soft, nontender and nondistended. Normal bowel sounds. Neurologic:  Alert and oriented,  grossly normal neurologically. Psych:  Cooperative. Normal mood and affect.   Lab Results:  Recent Labs  01/31/13 0450 01/31/13 1700 02/01/13 0530  WBC 8.0  --  4.5  HGB 7.0* 7.7* 7.2*  HCT 21.5* 23.8* 22.5*  PLT 113*  --  93*   BMET  Recent Labs  01/31/13 0450  NA 141  K 3.8  CL 109  CO2 23  GLUCOSE 180*  BUN 12  CREATININE 1.05  CALCIUM 7.4*     Assessment / Plan:    67. 48 year old female with variceal bleed, s/p banding 01/28/13. Hgb overall stable at 7.2. No further bleeding. She is on antibiotics for SBP prevention, completed Octreotide. Not on PPI, will add since there is potential for ulcerations at banding sites. Workup for etiology of cirrhosis is underway. Ceruloplasmin, alpha 1 antitrypsin normal. Hep A, B, C negative. Additional labs pending. PNA and flu vaccines were ordered. Hep A total ab negative, Hep B surface ab negative. Will need vaccinations (outpt)  2. Odynophagia post banding. Starting oral PPI and will try Carafate as well. - probably post-banding pain - should dissipate over time  3. Liver lesion on u/s. Will need CTscan vrs MRI at some point. AFP normal.   4. [place] colon cancer in mother at age 47   LOS: 4 days   Tye Savoy  02/01/2013, 10:16 AM  [facility] Attending  I  agree with the above note. Should be reaching time for dc and outpatient f/u Dr. Benson Norway. Most important next test is triple phase CT of liver vs.  MRI (claustrophobic, though) Looks like a colonoscopy will be appropriate also - at some point.  Gatha Mayer, MD, [facility] [phone] (pager) 02/01/2013 2:24 PM

## 2013-02-02 DIAGNOSIS — I10 Essential (primary) hypertension: Secondary | ICD-10-CM

## 2013-02-02 DIAGNOSIS — K7689 Other specified diseases of liver: Secondary | ICD-10-CM

## 2013-02-02 DIAGNOSIS — I8501 Esophageal varices with bleeding: Secondary | ICD-10-CM

## 2013-02-02 LAB — BASIC METABOLIC PANEL
BUN: 8 mg/dL (ref 6–23)
CALCIUM: 7.8 mg/dL — AB (ref 8.4–10.5)
CO2: 25 mEq/L (ref 19–32)
Chloride: 105 mEq/L (ref 96–112)
Creatinine, Ser: 0.95 mg/dL (ref 0.50–1.10)
GFR calc Af Amer: 82 mL/min — ABNORMAL LOW (ref >90)
GFR calc non Af Amer: 71 mL/min — ABNORMAL LOW (ref >90)
Glucose, Bld: 297 mg/dL — ABNORMAL HIGH (ref 70–99)
Potassium: 3.6 mEq/L — ABNORMAL LOW (ref 3.7–5.3)
Sodium: 138 mEq/L (ref 137–147)

## 2013-02-02 LAB — GLUCOSE, CAPILLARY
GLUCOSE-CAPILLARY: 300 mg/dL — AB (ref 70–99)
Glucose-Capillary: 204 mg/dL — ABNORMAL HIGH (ref 70–99)
Glucose-Capillary: 219 mg/dL — ABNORMAL HIGH (ref 70–99)
Glucose-Capillary: 238 mg/dL — ABNORMAL HIGH (ref 70–99)
Glucose-Capillary: 242 mg/dL — ABNORMAL HIGH (ref 70–99)

## 2013-02-02 LAB — PREPARE RBC (CROSSMATCH)

## 2013-02-02 MED ORDER — LORAZEPAM 2 MG/ML IJ SOLN
2.0000 mg | Freq: Once | INTRAMUSCULAR | Status: AC
  Administered 2013-02-04: 2 mg via INTRAVENOUS
  Filled 2013-02-02: qty 1

## 2013-02-02 MED ORDER — SODIUM CHLORIDE 0.9 % IJ SOLN
10.0000 mL | INTRAMUSCULAR | Status: DC | PRN
  Administered 2013-02-02: 20 mL
  Administered 2013-02-03 (×2): 10 mL
  Administered 2013-02-04: 20 mL
  Administered 2013-02-05 (×2): 10 mL

## 2013-02-02 MED ORDER — SODIUM CHLORIDE 0.9 % IJ SOLN
10.0000 mL | Freq: Two times a day (BID) | INTRAMUSCULAR | Status: DC
  Administered 2013-02-03 – 2013-02-04 (×2): 10 mL

## 2013-02-02 MED ORDER — LEVOFLOXACIN 750 MG PO TABS
750.0000 mg | ORAL_TABLET | Freq: Every day | ORAL | Status: DC
  Administered 2013-02-02 – 2013-02-05 (×4): 750 mg via ORAL
  Filled 2013-02-02 (×4): qty 1

## 2013-02-02 NOTE — Progress Notes (Signed)
    Progress Note   Subjective  swallowing better on carafate and PPI. It hurts to bear weight on right foot   Objective   Vital signs in last 24 hours: Temp:  [98.2 F (36.8 C)-99.8 F (37.7 C)] 99.8 F (37.7 C) (01/04 0459) Pulse Rate:  [67-73] 71 (01/04 0459) Resp:  [12-18] 17 (01/04 0459) BP: (123-157)/(59-76) 123/59 mmHg (01/04 0459) SpO2:  [97 %-100 %] 99 % (01/04 0459) Weight:  [203 lb 7.8 oz (92.3 kg)] 203 lb 7.8 oz (92.3 kg) (01/03 1740) Last BM Date: 01/31/13 General:    black female in NAD Abdomen:  Soft, nontender and nondistended. Normal bowel sounds. Extremities:  Without edema. Neurologic:  Alert and oriented,  grossly normal neurologically. Psych:  Cooperative. Normal mood and affect.  Lab Results:  Recent Labs  01/31/13 0450 01/31/13 1700 02/01/13 0530  WBC 8.0  --  4.5  HGB 7.0* 7.7* 7.2*  HCT 21.5* 23.8* 22.5*  PLT 113*  --  93*   BMET  Recent Labs  01/31/13 0450  NA 141  K 3.8  CL 109  CO2 23  GLUCOSE 180*  BUN 12  CREATININE 1.05  CALCIUM 7.4*     Assessment / Plan:   1. variceal bleed, s/p banding 01/28/13. Bleeding resolved  She is on antibiotics for SBP prevention. Workup for etiology of cirrhosis is underway. PNA and flu vaccines were ordered. Based on labs, she will need outpatient HAV and HBV vaccinations  2. Odynophagia post banding. Started oral PPI and Carafate yesterday, pain better. Would continue Carafate outpatient for next 10-14 days. Continue daily PPI until seen by Dr. Benson Norway outpatient.   3. Liver lesion on u/s. Will need CTscan vrs MRI at some point. AFP normal.   4. [place] colon cancer in mother at age 37. Further evaluation outpatient by Dr. Benson Norway  5. Right foot pain. First time she is bearing weight on foot. She reports falling on right side at home just prior to admission. Will defer to primary team.    LOS: 5 days   Tye Savoy  02/02/2013, 10:37 AM   Agree with Ms. Vanita Ingles assessment and plan. - she may  not need to go home on both PPI and carafate - would probably use daily OTC PPI at dc. From GI standpoint she is close to or at dc status I think. If she can get the liver lesion assessed here would expedite that but has been difficulty due to claustrophobia (MR) but could get triple phase CT of liver. Gatha Mayer, MD, [facility]

## 2013-02-02 NOTE — Progress Notes (Signed)
Critical Hemoglobin value of 6.8 called in by [facility] in lab.

## 2013-02-02 NOTE — Progress Notes (Signed)
[facility] Progress Note [place] TEAM 1 - Stepdown/ICU TEAM   Selena Bennett RKY:[id number] DOB: Nov 07, 1965 DOA: 01/28/2013 PCP: Christian Mate, MD    Brief narrative: 48 y/o feamle with DM, HTN, GERD presented to [facility] with syncope due to severe hematemesis (found in a poor of blood) and found to have a Hgb of 4.2. She was transferred to [facility] for a GI work up. She underwent banding of esophageal varices the same day by Dr Benson Norway. During this admission she has been discovered to have cirrhosis, the cause of which is still undetermined. She and her husband agree that she has never abused Alcohol.    Subjective: Still c/o some substernal pain where "bands were placed". Tolerating diet  Assessment/Plan: Principal Problem:   GIB / Esophageal varices with hemorrhage - s/p banding on 12/30 - d/c'd Octreotide infusion -cont daily Protonix per GI - On levaquin for empiric coverage for translocation of bacteria during banding-   Active Problems:  Chest pain after banding - Carafate and PPI per GI  Thrombocytopenia - follow - may be due to consumption and will recover soon    Cirrhosis of liver without mention of alcohol - work up per GI- hepatitis serologies negative  Liver nodule -  triple phase CT planned but unable to obtain a 20 gauge IV in first attempt, pt refused further attempts - AFP within normal range - An MRI of liver has been ordered, will administer IV ativan prior given claustrophobia.     Acute blood loss anemia - Patient's hemoglobin trended down to 6.8 for which she was typed and crossed has 2 units of packed red blood cells have been ordered.    HTN (hypertension) - Blood pressures controlled, continue lisinopril 5 mg by mouth daily     DM (diabetes mellitus) - holding Metformin- cont sliding scale insulin   Code Status: Full Family Communication: with husband on 1/1 Disposition Plan: likely home  Consultants: GI  Procedures: 12/30-  EGD- 7 band placed for esophageal varices  Antibiotics: Rocephin 12/31>>  DVT prophylaxis: SCDs  Objective: Blood pressure 130/61, pulse 76, temperature 98.9 F (37.2 C), temperature source Oral, resp. rate 18, height 5\' 5"  (1.651 m), weight 92.3 kg (203 lb 7.8 oz), last menstrual period 01/13/2013, SpO2 100.00%.  Intake/Output Summary (Last 24 hours) at 02/02/13 1859 Last data filed at 02/01/13 2340  Gross per 24 hour  Intake      0 ml  Output    650 ml  Net   -650 ml     Exam: General: No acute respiratory distress Lungs: Clear to auscultation bilaterally without wheezes or crackles Cardiovascular: Regular rate and rhythm without murmur gallop or rub normal S1 and S2 Abdomen: Nontender, nondistended, soft, bowel sounds positive, no rebound, no ascites, no appreciable mass Extremities: No significant cyanosis, clubbing, or edema bilateral lower extremities  Data Reviewed: Basic Metabolic Panel:  Recent Labs Lab 01/28/13 1210 01/29/13 0220 01/31/13 0450 02/02/13 1225  NA 137 143 141 138  K 4.1 4.4 3.8 3.6*  CL 105 111 109 105  CO2 21 23 23 25   GLUCOSE 395* 135* 180* 297*  BUN 27* 27* 12 8  CREATININE 0.99 1.05 1.05 0.95  CALCIUM 7.5* 7.5* 7.4* 7.8*   Liver Function Tests:  Recent Labs Lab 01/28/13 1210 01/29/13 0220  AST 14 16  ALT 10 11  ALKPHOS 82 76  BILITOT 0.6 0.9  PROT 5.3* 5.5*  ALBUMIN 2.3* 2.3*   No results found for  this basename: LIPASE, AMYLASE,  in the last 168 hours No results found for this basename: AMMONIA,  in the last 168 hours CBC:  Recent Labs Lab 01/28/13 1930 01/29/13 0220  01/30/13 1400 01/31/13 0450 01/31/13 1700 02/01/13 0530 02/02/13 1225  WBC 13.2* 15.1*  --   --  8.0  --  4.5 4.0  HGB 8.3* 8.1*  < > 7.5* 7.0* 7.7* 7.2* 6.8*  HCT 24.9* 24.9*  < > 23.5* 21.5* 23.8* 22.5* 20.9*  MCV 80.8 80.8  --   --  83.3  --  84.3 82.6  PLT 126* 153  --   --  113*  --  93* 104*  < > = values in this interval not  displayed. Cardiac Enzymes:  Recent Labs Lab 01/28/13 1300 01/28/13 1930 01/29/13 0130  TROPONINI <0.30 <0.30 <0.30   BNP (last 3 results) No results found for this basename: PROBNP,  in the last 8760 hours CBG:  Recent Labs Lab 02/01/13 1703 02/02/13 0630 02/02/13 0731 02/02/13 1200 02/02/13 1642  GLUCAP 234* 242* 238* 300* 204*    Recent Results (from the past 240 hour(s))  MRSA PCR SCREENING     Status: None   Collection Time    01/28/13 11:00 AM      Result Value Range Status   MRSA by PCR NEGATIVE  NEGATIVE Final   Comment:            The GeneXpert MRSA Assay (FDA     approved for NASAL specimens     only), is one component of a     comprehensive MRSA colonization     surveillance program. It is not     intended to diagnose MRSA     infection nor to guide or     monitor treatment for     MRSA infections.     Studies:  Recent x-ray studies have been reviewed in detail by the Attending Physician  Scheduled Meds:  Scheduled Meds: . antiseptic oral rinse  15 mL Mouth Rinse BID  . influenza vac split quadrivalent PF  0.5 mL Intramuscular Tomorrow-1000  . insulin aspart  0-20 Units Subcutaneous TID WC  . levofloxacin (LEVAQUIN) IV  750 mg Intravenous Q24H  . lisinopril  5 mg Oral Daily  . LORazepam  2 mg Intravenous Once  . pantoprazole  40 mg Oral Q0600  . sodium chloride  10-40 mL Intracatheter Q12H  . sucralfate  1 g Oral Q6H   Continuous Infusions:    Time spent on care of this patient: 35 min   Kelvin Cellar, MD  [facility] Office  [phone] Pager - Text Page per Shea Evans as per below:  On-Call/Text Page:      [email or web address]      password [email or web address]  If 7PM-7AM, please contact night-coverage [email or web address] Password [email or web address] 02/02/2013, 6:59 PM   LOS: 5 days

## 2013-02-02 NOTE — Progress Notes (Signed)
Attempt PIV for CT scan.  Assessed both arms with ultrasound.  Attempted once  Rt arm unsuccessful.  No other veins found to be suitable for 20ga for CT scan.  Consider other options such as power PICC line if CT with contrast still desired.

## 2013-02-03 LAB — BASIC METABOLIC PANEL
BUN: 9 mg/dL (ref 6–23)
CHLORIDE: 106 meq/L (ref 96–112)
CO2: 25 meq/L (ref 19–32)
Calcium: 7.7 mg/dL — ABNORMAL LOW (ref 8.4–10.5)
Creatinine, Ser: 0.98 mg/dL (ref 0.50–1.10)
GFR calc Af Amer: 79 mL/min — ABNORMAL LOW (ref >90)
GFR calc non Af Amer: 68 mL/min — ABNORMAL LOW (ref >90)
GLUCOSE: 221 mg/dL — AB (ref 70–99)
POTASSIUM: 3.5 meq/L — AB (ref 3.7–5.3)
SODIUM: 140 meq/L (ref 137–147)

## 2013-02-03 LAB — GLUCOSE, CAPILLARY
GLUCOSE-CAPILLARY: 206 mg/dL — AB (ref 70–99)
GLUCOSE-CAPILLARY: 297 mg/dL — AB (ref 70–99)
Glucose-Capillary: 203 mg/dL — ABNORMAL HIGH (ref 70–99)
Glucose-Capillary: 168 mg/dL — ABNORMAL HIGH (ref 70–99)

## 2013-02-03 LAB — CBC
HCT: 20.9 % — ABNORMAL LOW (ref 36.0–46.0)
HEMATOCRIT: 26.4 % — AB (ref 36.0–46.0)
Hemoglobin: 6.8 g/dL — CL (ref 12.0–15.0)
Hemoglobin: 9 g/dL — ABNORMAL LOW (ref 12.0–15.0)
MCH: 26.9 pg (ref 26.0–34.0)
MCH: 28 pg (ref 26.0–34.0)
MCHC: 32.5 g/dL (ref 30.0–36.0)
MCHC: 34.1 g/dL (ref 30.0–36.0)
MCV: 82.6 fL (ref 78.0–100.0)
MCV: 82.2 fL (ref 78.0–100.0)
PLATELETS: 104 10*3/uL — AB (ref 150–400)
Platelets: 107 10*3/uL — ABNORMAL LOW (ref 150–400)
RBC: 2.53 MIL/uL — ABNORMAL LOW (ref 3.87–5.11)
RBC: 3.21 MIL/uL — AB (ref 3.87–5.11)
RDW: 16.7 % — ABNORMAL HIGH (ref 11.5–15.5)
RDW: 15.5 % (ref 11.5–15.5)
WBC: 4 10*3/uL (ref 4.0–10.5)
WBC: 4.6 10*3/uL (ref 4.0–10.5)

## 2013-02-03 LAB — ANA: Anti Nuclear Antibody(ANA): NEGATIVE

## 2013-02-03 LAB — MITOCHONDRIAL ANTIBODIES: Mitochondrial M2 Ab, IgG: 0.29 (ref <0.91)

## 2013-02-03 MED ORDER — LISINOPRIL 20 MG PO TABS
20.0000 mg | ORAL_TABLET | Freq: Every day | ORAL | Status: DC
  Administered 2013-02-04 – 2013-02-05 (×2): 20 mg via ORAL
  Filled 2013-02-03 (×2): qty 1

## 2013-02-03 MED ORDER — POTASSIUM CHLORIDE CRYS ER 20 MEQ PO TBCR
40.0000 meq | EXTENDED_RELEASE_TABLET | Freq: Once | ORAL | Status: AC
  Administered 2013-02-03: 40 meq via ORAL
  Filled 2013-02-03: qty 2

## 2013-02-03 NOTE — Progress Notes (Signed)
Subjective: No complaints.  Swallowing is better with carafate.  Objective: Vital signs in last 24 hours: Temp:  [98.3 F (36.8 C)-99.3 F (37.4 C)] 98.6 F (37 C) (01/05 0714) Pulse Rate:  [71-99] 74 (01/05 0714) Resp:  [18-20] 18 (01/05 0714) BP: (130-152)/(61-79) 138/73 mmHg (01/05 0714) SpO2:  [99 %-100 %] 100 % (01/05 0714) Last BM Date: 01/31/13  Intake/Output from previous day: 01/04 0701 - 01/05 0700 In: 343.8 [Blood:343.8] Out: 350 [Urine:350] Intake/Output this shift:    General appearance: alert and no distress GI: soft, non-tender; bowel sounds normal; no masses,  no organomegaly  Lab Results:  Recent Labs  01/31/13 1700 02/01/13 0530 02/02/13 1225  WBC  --  4.5 4.0  HGB 7.7* 7.2* 6.8*  HCT 23.8* 22.5* 20.9*  PLT  --  93* 104*   BMET  Recent Labs  02/02/13 1225 02/03/13 0545  NA 138 140  K 3.6* 3.5*  CL 105 106  CO2 25 25  GLUCOSE 297* 221*  BUN 8 9  CREATININE 0.95 0.98  CALCIUM 7.8* 7.7*   LFT No results found for this basename: PROT, ALBUMIN, AST, ALT, ALKPHOS, BILITOT, BILIDIR, IBILI,  in the last 72 hours PT/INR No results found for this basename: LABPROT, INR,  in the last 72 hours Hepatitis Panel No results found for this basename: HEPBSAG, HCVAB, HEPAIGM, HEPBIGM,  in the last 72 hours C-Diff No results found for this basename: CDIFFTOX,  in the last 72 hours Fecal Lactopherrin No results found for this basename: FECLLACTOFRN,  in the last 72 hours  Studies/Results: No results found.  Medications:  Scheduled: . antiseptic oral rinse  15 mL Mouth Rinse BID  . influenza vac split quadrivalent PF  0.5 mL Intramuscular Tomorrow-1000  . insulin aspart  0-20 Units Subcutaneous TID WC  . levofloxacin  750 mg Oral Daily  . lisinopril  5 mg Oral Daily  . LORazepam  2 mg Intravenous Once  . pantoprazole  40 mg Oral Q0600  . sodium chloride  10-40 mL Intracatheter Q12H  . sucralfate  1 g Oral Q6H   Continuous:    Assessment/Plan: 1) S/p esophageal variceal bleed. 2) Cirrhosis. 3) ? Lesion in the liver. 4) Anemia.   No overt signs of bleeding.  The patient is well at this time.  She is receiving blood transfusions.  Her current serologies are negative, but the autoimmune serologies, i.e., ANA, AMA, and ASMA are pending.  Plan: 1) Follow HGB. 2) Continue with Carafate and Protonix.   LOS: 6 days   Journee Bobrowski D 02/03/2013, 8:18 AM

## 2013-02-03 NOTE — Progress Notes (Signed)
Inpatient Diabetes Program Recommendations  AACE/ADA: New Consensus Statement on Inpatient Glycemic Control (2013)  Target Ranges:  Prepandial:   less than 140 mg/dL      Peak postprandial:   less than 180 mg/dL (1-2 hours)      Critically ill patients:  140 - 180 mg/dL     Results for Selena Bennett, Selena Bennett (MRN [id number]) as of 02/03/2013 09:24  Ref. Range 02/02/2013 07:31 02/02/2013 12:00 02/02/2013 16:42 02/02/2013 23:39  Glucose-Capillary Latest Range: 70-99 mg/dL 238 (H) 300 (H) 204 (H) 219 (H)    Results for Selena Bennett, Selena Bennett (MRN [id number]) as of 02/03/2013 09:24  Ref. Range 02/03/2013 07:51  Glucose-Capillary Latest Range: 70-99 mg/dL 203 (H)    **Fasting glucose levels elevated X 2 days.   **MD- Please consider starting basal insulin while patient here in hospital- Levemir 15 units QHS (~0.2 units/kg dosing)   Will follow. Wyn Quaker RN, MSN, CDE Diabetes Coordinator Inpatient Diabetes Program Team Pager: [phone] (8a-10p)

## 2013-02-03 NOTE — Progress Notes (Signed)
[facility] Progress Note [place] TEAM 1 - Stepdown/ICU TEAM   Selena Bennett NOM:[id number] DOB: [birth year] DOA: 01/28/2013 PCP: Christian Mate, MD    Brief narrative: 48 y/o feamle with DM, HTN, GERD presented to [facility] with syncope due to severe hematemesis (found in a poor of blood) and found to have a Hgb of 4.2. She was transferred to [facility] for a GI work up. She underwent banding of esophageal varices the same day by Dr Benson Norway. During this admission she has been discovered to have cirrhosis, the cause of which is still undetermined. She and her husband agree that she has never abused Alcohol.    Subjective: Patient states feeling better today, has no complaints. Tolerating by mouth intake remains afebrile  Assessment/Plan: Principal Problem:   GIB / Esophageal varices with hemorrhage - s/p banding on 12/30 - d/c'd Octreotide infusion -cont daily Protonix per GI - On levaquin for empiric coverage for translocation of bacteria during banding-   Active Problems:   Thrombocytopenia - Clinic at remained stable, as were his laboratory platelet count of 107,000.    Cirrhosis of liver without mention of alcohol - work up per GI- hepatitis serologies negative  Liver nodule - An MRI of liver has been ordered and is pending at the time of this dictation. Hopefully she'll have this done prior to hospital discharge.    Acute blood loss anemia - Patient's hemoglobin improved from 6.8 to 9.0 on this mornings lab work, after 2 units of packed red blood cells were transfused yesterday. She states feeling better overall after transfusion.    HTN (hypertension) - Blood pressures were elevated for which her lisinopril was increased fibrovascular daily to 20 mg by mouth daily.     DM (diabetes mellitus) - holding Metformin- cont sliding scale insulin   Code Status: Full Family Communication: with husband on 1/1 Disposition Plan: likely  home  Consultants: GI  Procedures: 12/30- EGD- 7 band placed for esophageal varices  Antibiotics: Rocephin 12/31>>  DVT prophylaxis: SCDs  Objective: Blood pressure 163/81, pulse 85, temperature 99.4 F (37.4 C), temperature source Oral, resp. rate 18, height 5\' 5"  (1.651 m), weight 92.3 kg (203 lb 7.8 oz), last menstrual period 01/13/2013, SpO2 99.00%.  Intake/Output Summary (Last 24 hours) at 02/03/13 1851 Last data filed at 02/03/13 0910  Gross per 24 hour  Intake 668.75 ml  Output    350 ml  Net 318.75 ml     Exam: General: No acute respiratory distress Lungs: Clear to auscultation bilaterally without wheezes or crackles Cardiovascular: Regular rate and rhythm without murmur gallop or rub normal S1 and S2 Abdomen: Nontender, nondistended, soft, bowel sounds positive, no rebound, no ascites, no appreciable mass Extremities: No significant cyanosis, clubbing, or edema bilateral lower extremities  Data Reviewed: Basic Metabolic Panel:  Recent Labs Lab 01/28/13 1210 01/29/13 0220 01/31/13 0450 02/02/13 1225 02/03/13 0545  NA 137 143 141 138 140  K 4.1 4.4 3.8 3.6* 3.5*  CL 105 111 109 105 106  CO2 21 23 23 25 25   GLUCOSE 395* 135* 180* 297* 221*  BUN 27* 27* 12 8 9   CREATININE 0.99 1.05 1.05 0.95 0.98  CALCIUM 7.5* 7.5* 7.4* 7.8* 7.7*   Liver Function Tests:  Recent Labs Lab 01/28/13 1210 01/29/13 0220  AST 14 16  ALT 10 11  ALKPHOS 82 76  BILITOT 0.6 0.9  PROT 5.3* 5.5*  ALBUMIN 2.3* 2.3*   No results found for this basename: LIPASE, AMYLASE,  in the last 168 hours No results found for this basename: AMMONIA,  in the last 168 hours CBC:  Recent Labs Lab 01/29/13 0220  01/31/13 0450 01/31/13 1700 02/01/13 0530 02/02/13 1225 02/03/13 1059  WBC 15.1*  --  8.0  --  4.5 4.0 4.6  HGB 8.1*  < > 7.0* 7.7* 7.2* 6.8* 9.0*  HCT 24.9*  < > 21.5* 23.8* 22.5* 20.9* 26.4*  MCV 80.8  --  83.3  --  84.3 82.6 82.2  PLT 153  --  113*  --  93* 104* 107*   < > = values in this interval not displayed. Cardiac Enzymes:  Recent Labs Lab 01/28/13 1300 01/28/13 1930 01/29/13 0130  TROPONINI <0.30 <0.30 <0.30   BNP (last 3 results) No results found for this basename: PROBNP,  in the last 8760 hours CBG:  Recent Labs Lab 02/02/13 1642 02/02/13 2339 02/03/13 0751 02/03/13 1215 02/03/13 1710  GLUCAP 204* 219* 203* 168* 206*    Recent Results (from the past 240 hour(s))  MRSA PCR SCREENING     Status: None   Collection Time    01/28/13 11:00 AM      Result Value Range Status   MRSA by PCR NEGATIVE  NEGATIVE Final   Comment:            The GeneXpert MRSA Assay (FDA     approved for NASAL specimens     only), is one component of a     comprehensive MRSA colonization     surveillance program. It is not     intended to diagnose MRSA     infection nor to guide or     monitor treatment for     MRSA infections.     Studies:  Recent x-ray studies have been reviewed in detail by the Attending Physician  Scheduled Meds:  Scheduled Meds: . antiseptic oral rinse  15 mL Mouth Rinse BID  . influenza vac split quadrivalent PF  0.5 mL Intramuscular Tomorrow-1000  . insulin aspart  0-20 Units Subcutaneous TID WC  . levofloxacin  750 mg Oral Daily  . [START ON 02/04/2013] lisinopril  20 mg Oral Daily  . LORazepam  2 mg Intravenous Once  . pantoprazole  40 mg Oral Q0600  . sodium chloride  10-40 mL Intracatheter Q12H  . sucralfate  1 g Oral Q6H   Continuous Infusions:    Time spent on care of this patient: 35 min   Kelvin Cellar, MD  [facility] Office  [phone] Pager - Text Page per Shea Evans as per below:  On-Call/Text Page:      [email or web address]      password [email or web address]  If 7PM-7AM, please contact night-coverage [email or web address] Password [email or web address] 02/03/2013, 6:51 PM   LOS: 6 days

## 2013-02-04 ENCOUNTER — Inpatient Hospital Stay (HOSPITAL_COMMUNITY): Payer: PRIVATE HEALTH INSURANCE

## 2013-02-04 LAB — TYPE AND SCREEN
ABO/RH(D): A POS
Antibody Screen: NEGATIVE
UNIT DIVISION: 0
Unit division: 0

## 2013-02-04 LAB — BASIC METABOLIC PANEL
BUN: 10 mg/dL (ref 6–23)
CHLORIDE: 102 meq/L (ref 96–112)
CO2: 24 mEq/L (ref 19–32)
Calcium: 8.1 mg/dL — ABNORMAL LOW (ref 8.4–10.5)
Creatinine, Ser: 0.94 mg/dL (ref 0.50–1.10)
GFR calc non Af Amer: 72 mL/min — ABNORMAL LOW (ref >90)
GFR, EST AFRICAN AMERICAN: 83 mL/min — AB (ref >90)
GLUCOSE: 318 mg/dL — AB (ref 70–99)
Potassium: 3.7 mEq/L (ref 3.7–5.3)
SODIUM: 138 meq/L (ref 137–147)

## 2013-02-04 LAB — CBC
HEMATOCRIT: 27.1 % — AB (ref 36.0–46.0)
Hemoglobin: 8.9 g/dL — ABNORMAL LOW (ref 12.0–15.0)
MCH: 27.1 pg (ref 26.0–34.0)
MCHC: 32.8 g/dL (ref 30.0–36.0)
MCV: 82.6 fL (ref 78.0–100.0)
Platelets: 107 10*3/uL — ABNORMAL LOW (ref 150–400)
RBC: 3.28 MIL/uL — ABNORMAL LOW (ref 3.87–5.11)
RDW: 15.8 % — ABNORMAL HIGH (ref 11.5–15.5)
WBC: 4.2 10*3/uL (ref 4.0–10.5)

## 2013-02-04 LAB — ANTI-SMOOTH MUSCLE ANTIBODY, IGG: F-Actin IgG: 33 U — ABNORMAL HIGH (ref <20)

## 2013-02-04 LAB — GLUCOSE, CAPILLARY
GLUCOSE-CAPILLARY: 254 mg/dL — AB (ref 70–99)
GLUCOSE-CAPILLARY: 154 mg/dL — AB (ref 70–99)
Glucose-Capillary: 315 mg/dL — ABNORMAL HIGH (ref 70–99)

## 2013-02-04 MED ORDER — AMLODIPINE BESYLATE 5 MG PO TABS
5.0000 mg | ORAL_TABLET | Freq: Every day | ORAL | Status: DC
  Administered 2013-02-04: 5 mg via ORAL
  Filled 2013-02-04 (×2): qty 1

## 2013-02-04 MED ORDER — OXYCODONE HCL 5 MG/5ML PO SOLN
10.0000 mg | ORAL | Status: DC | PRN
  Administered 2013-02-04 – 2013-02-05 (×2): 10 mg via ORAL
  Filled 2013-02-04 (×2): qty 10

## 2013-02-04 MED ORDER — TRAMADOL HCL 50 MG PO TABS
50.0000 mg | ORAL_TABLET | Freq: Four times a day (QID) | ORAL | Status: DC | PRN
  Administered 2013-02-04 – 2013-02-05 (×2): 50 mg via ORAL
  Filled 2013-02-04 (×3): qty 1

## 2013-02-04 NOTE — Progress Notes (Signed)
Inpatient Diabetes Program Recommendations  AACE/ADA: New Consensus Statement on Inpatient Glycemic Control (2013)  Target Ranges:  Prepandial:   less than 140 mg/dL      Peak postprandial:   less than 180 mg/dL (1-2 hours)      Critically ill patients:  140 - 180 mg/dL     Results for Selena Bennett, Selena Bennett (MRN [id number]) as of 02/04/2013 09:04  Ref. Range 02/03/2013 07:51 02/03/2013 12:15 02/03/2013 17:10 02/03/2013 22:22  Glucose-Capillary Latest Range: 70-99 mg/dL 203 (H) 168 (H) 206 (H) 297 (H)    Results for Selena Bennett, Selena Bennett (MRN [id number]) as of 02/04/2013 09:04  Ref. Range 02/04/2013 07:55  Glucose-Capillary Latest Range: 70-99 mg/dL 254 (H)     **Fasting glucose levels elevated X 3 days.    **MD- Please consider starting basal insulin while patient here in hospital- Levemir 15 units QHS (~0.2 units/kg dosing)    Will follow. Wyn Quaker RN, MSN, CDE Diabetes Coordinator Inpatient Diabetes Program Team Pager: [phone] (8a-10p)

## 2013-02-04 NOTE — Progress Notes (Signed)
ANTIBIOTIC CONSULT NOTE - Follow-up  Pharmacy Consult for levaquin Indication: empiric GI coverage  No Known Allergies  Patient Measurements: Height: 5\' 5"  (165.1 cm) Weight: 203 lb 7.8 oz (92.3 kg) IBW/kg (Calculated) : 57   Vital Signs: Temp: 98 F (36.7 C) (01/06 0526) Temp src: Oral (01/06 0526) BP: 161/83 mmHg (01/06 0526) Pulse Rate: 67 (01/06 0526) Intake/Output from previous day: 01/05 0701 - 01/06 0700 In: 325 [Blood:325] Out: -  Intake/Output from this shift:    Labs:  Recent Labs  02/02/13 1225 02/03/13 0545 02/03/13 1059 02/04/13 0520  WBC 4.0  --  4.6 4.2  HGB 6.8*  --  9.0* 8.9*  PLT 104*  --  107* 107*  CREATININE 0.95 0.98  --  0.94   Estimated Creatinine Clearance: 83.9 ml/min (by C-G formula based on Cr of 0.94). No results found for this basename: VANCOTROUGH, Corlis Leak, VANCORANDOM, GENTTROUGH, GENTPEAK, GENTRANDOM, TOBRATROUGH, TOBRAPEAK, TOBRARND, AMIKACINPEAK, AMIKACINTROU, AMIKACIN,  in the last 72 hours   Microbiology: Recent Results (from the past 720 hour(s))  MRSA PCR SCREENING     Status: None   Collection Time    01/28/13 11:00 AM      Result Value Range Status   MRSA by PCR NEGATIVE  NEGATIVE Final   Comment:            The GeneXpert MRSA Assay (FDA     approved for NASAL specimens     only), is one component of a     comprehensive MRSA colonization     surveillance program. It is not     intended to diagnose MRSA     infection nor to guide or     monitor treatment for     MRSA infections.    Medications:  Anti-infectives   Start     Dose/Rate Route Frequency Ordered Stop   02/02/13 2015  levofloxacin (LEVAQUIN) tablet 750 mg     750 mg Oral Daily 02/02/13 1904     02/01/13 1600  levofloxacin (LEVAQUIN) IVPB 750 mg  Status:  Discontinued     750 mg 100 mL/hr over 90 Minutes Intravenous Every 24 hours 02/01/13 1533 02/02/13 1904   01/31/13 0930  cefTRIAXone (ROCEPHIN) 1 g in dextrose 5 % 50 mL IVPB  Status:   Discontinued     1 g 100 mL/hr over 30 Minutes Intravenous Every 24 hours 01/31/13 0640 02/01/13 1528   01/29/13 0800  cefTRIAXone (ROCEPHIN) 1 g in dextrose 5 % 50 mL IVPB  Status:  Discontinued     1 g 100 mL/hr over 30 Minutes Intravenous Daily 01/29/13 0633 01/30/13 1713     Assessment: Pt on Levaquin Day #4 for SBP prevention. Tm 99.4. WBC wnl. No cultures. Per MD note - today is last day for abx.  Ceftriaxone 12/31>>1/3  Levaquin 1/3>>  Goal of Therapy:  Eradication of infection  Plan:  1. Levaquin 750mg  po Q24H 2. F/u renal fxn, C&S, clinical status 3. F/u d/c of abx tomorrow  Sherlon Handing, PharmD, BCPS Clinical pharmacist, pager [phone] 02/04/2013,12:48 PM

## 2013-02-04 NOTE — Progress Notes (Signed)
Subjective: Feeling well.  No complaints.  Objective: Vital signs in last 24 hours: Temp:  [98 F (36.7 C)-99.4 F (37.4 C)] 98 F (36.7 C) (01/06 0526) Pulse Rate:  [67-85] 67 (01/06 0526) Resp:  [16-19] 18 (01/06 0526) BP: (139-163)/(75-83) 161/83 mmHg (01/06 0526) SpO2:  [99 %-100 %] 100 % (01/06 0526) Last BM Date: 01/31/13  Intake/Output from previous day: 01/05 0701 - 01/06 0700 In: 325 [Blood:325] Out: -  Intake/Output this shift:    General appearance: alert and no distress GI: soft, non-tender; bowel sounds normal; no masses,  no organomegaly  Lab Results:  Recent Labs  02/02/13 1225 02/03/13 1059 02/04/13 0520  WBC 4.0 4.6 4.2  HGB 6.8* 9.0* 8.9*  HCT 20.9* 26.4* 27.1*  PLT 104* 107* 107*   BMET  Recent Labs  02/02/13 1225 02/03/13 0545 02/04/13 0520  NA 138 140 138  K 3.6* 3.5* 3.7  CL 105 106 102  CO2 25 25 24   GLUCOSE 297* 221* 318*  BUN 8 9 10   CREATININE 0.95 0.98 0.94  CALCIUM 7.8* 7.7* 8.1*   LFT No results found for this basename: PROT, ALBUMIN, AST, ALT, ALKPHOS, BILITOT, BILIDIR, IBILI,  in the last 72 hours PT/INR No results found for this basename: LABPROT, INR,  in the last 72 hours Hepatitis Panel No results found for this basename: HEPBSAG, HCVAB, HEPAIGM, HEPBIGM,  in the last 72 hours C-Diff No results found for this basename: CDIFFTOX,  in the last 72 hours Fecal Lactopherrin No results found for this basename: FECLLACTOFRN,  in the last 72 hours  Studies/Results: No results found.  Medications:  Scheduled: . antiseptic oral rinse  15 mL Mouth Rinse BID  . influenza vac split quadrivalent PF  0.5 mL Intramuscular Tomorrow-1000  . insulin aspart  0-20 Units Subcutaneous TID WC  . levofloxacin  750 mg Oral Daily  . lisinopril  20 mg Oral Daily  . LORazepam  2 mg Intravenous Once  . pantoprazole  40 mg Oral Q0600  . sodium chloride  10-40 mL Intracatheter Q12H  . sucralfate  1 g Oral Q6H   Continuous:    Assessment/Plan: 1) S/p esophageal variceal bleeding. 2) Cirrhosis - ? Etiology, but NASH is suspected.   Overall she is clinically stable.  No further issues, but her HGB needs to be remain stable before discharge.    Plan: 1) Monitor HGB for another 24 hours. 2) Last day for Ceftriaxone.  LOS: 7 days   Kayode Petion D 02/04/2013, 7:25 AM

## 2013-02-04 NOTE — Progress Notes (Addendum)
[facility] Progress Note [place] TEAM 1 - Stepdown/ICU TEAM   Selena Bennett KGU:[id number] DOB: Nov 13, 1965 DOA: 01/28/2013 PCP: Christian Mate, MD    Brief narrative: 48 y/o feamle with DM, HTN, GERD presented to [facility] with syncope due to severe hematemesis (found in a poor of blood) and found to have a Hgb of 4.2. She was transferred to [facility] for a GI work up. She underwent banding of esophageal varices the same day by Dr Benson Norway. During this admission she has been discovered to have cirrhosis, the cause of which is still undetermined. She and her husband agree that she has never abused Alcohol.    Subjective: Patient was administered 2 mg of IV Ativan for MRI for sedation as she reported being claustrophobic, during my evaluation she was sedated however arousable.  Assessment/Plan: Principal Problem:   GIB / Esophageal varices with hemorrhage - s/p banding on 12/30 - d/c'd Octreotide infusion -cont daily Protonix per GI - On levaquin for empiric coverage for translocation of bacteria during banding-   Active Problems:   Thrombocytopenia - Clinic at remained stable, as were his laboratory platelet count of 107,000.    Cirrhosis of liver without mention of alcohol - work up per GI- hepatitis serologies negative  Liver nodule - An MRI of liver shows cirrhosis without evidence of hepatoma.    Acute blood loss anemia - Patient's hemoglobin improved from 6.8 to 9.0 on this mornings lab work, after 2 units of packed red blood cells were transfused yesterday. She states feeling better overall after transfusion.    HTN (hypertension) - Blood pressures were elevated for which her lisinopril was increased to 20 mg by mouth daily. I noted that her blood pressures remain in the 150s to 160s this afternoon for which I have added Norvasc 5 mg by mouth daily.     DM (diabetes mellitus) - holding Metformin- cont sliding scale insulin   Code Status: Full Family  Communication: with husband on 1/1 Disposition Plan: likely home  Consultants: GI  Procedures: 12/30- EGD- 7 band placed for esophageal varices  Antibiotics: Rocephin 12/31>>  DVT prophylaxis: SCDs  Objective: Blood pressure 169/83, pulse 73, temperature 98.6 F (37 C), temperature source Oral, resp. rate 18, height 5\' 5"  (1.651 m), weight 92.3 kg (203 lb 7.8 oz), last menstrual period 01/13/2013, SpO2 100.00%.  Intake/Output Summary (Last 24 hours) at 02/04/13 1923 Last data filed at 02/04/13 1718  Gross per 24 hour  Intake    240 ml  Output      0 ml  Net    240 ml     Exam: General: No acute respiratory distress Lungs: Clear to auscultation bilaterally without wheezes or crackles Cardiovascular: Regular rate and rhythm without murmur gallop or rub normal S1 and S2 Abdomen: Nontender, nondistended, soft, bowel sounds positive, no rebound, no ascites, no appreciable mass Extremities: No significant cyanosis, clubbing, or edema bilateral lower extremities  Data Reviewed: Basic Metabolic Panel:  Recent Labs Lab 01/29/13 0220 01/31/13 0450 02/02/13 1225 02/03/13 0545 02/04/13 0520  NA 143 141 138 140 138  K 4.4 3.8 3.6* 3.5* 3.7  CL 111 109 105 106 102  CO2 23 23 25 25 24   GLUCOSE 135* 180* 297* 221* 318*  BUN 27* 12 8 9 10   CREATININE 1.05 1.05 0.95 0.98 0.94  CALCIUM 7.5* 7.4* 7.8* 7.7* 8.1*   Liver Function Tests:  Recent Labs Lab 01/29/13 0220  AST 16  ALT 11  ALKPHOS 76  BILITOT 0.9  PROT 5.5*  ALBUMIN 2.3*   No results found for this basename: LIPASE, AMYLASE,  in the last 168 hours No results found for this basename: AMMONIA,  in the last 168 hours CBC:  Recent Labs Lab 01/31/13 0450 01/31/13 1700 02/01/13 0530 02/02/13 1225 02/03/13 1059 02/04/13 0520  WBC 8.0  --  4.5 4.0 4.6 4.2  HGB 7.0* 7.7* 7.2* 6.8* 9.0* 8.9*  HCT 21.5* 23.8* 22.5* 20.9* 26.4* 27.1*  MCV 83.3  --  84.3 82.6 82.2 82.6  PLT 113*  --  93* 104* 107* 107*    Cardiac Enzymes:  Recent Labs Lab 01/28/13 1930 01/29/13 0130  TROPONINI <0.30 <0.30   BNP (last 3 results) No results found for this basename: PROBNP,  in the last 8760 hours CBG:  Recent Labs Lab 02/03/13 1710 02/03/13 2222 02/04/13 0755 02/04/13 1234 02/04/13 1711  GLUCAP 206* 297* 254* 154* 315*    Recent Results (from the past 240 hour(s))  MRSA PCR SCREENING     Status: None   Collection Time    01/28/13 11:00 AM      Result Value Range Status   MRSA by PCR NEGATIVE  NEGATIVE Final   Comment:            The GeneXpert MRSA Assay (FDA     approved for NASAL specimens     only), is one component of a     comprehensive MRSA colonization     surveillance program. It is not     intended to diagnose MRSA     infection nor to guide or     monitor treatment for     MRSA infections.     Studies:  Recent x-ray studies have been reviewed in detail by the Attending Physician  Scheduled Meds:  Scheduled Meds: . influenza vac split quadrivalent PF  0.5 mL Intramuscular Tomorrow-1000  . insulin aspart  0-20 Units Subcutaneous TID WC  . levofloxacin  750 mg Oral Daily  . lisinopril  20 mg Oral Daily  . pantoprazole  40 mg Oral Q0600  . sodium chloride  10-40 mL Intracatheter Q12H  . sucralfate  1 g Oral Q6H   Continuous Infusions:    Time spent on care of this patient: 35 min   Nation Cradle, MD  [facility] Office  [phone] Pager - Text Page per Amion as per below:  On-Call/Text Page:      [email or web address]      password [email or web address]  If 7PM-7AM, please contact night-coverage [email or web address] Password [email or web address] 02/04/2013, 7:23 PM   LOS: 7 days       

## 2013-02-05 DIAGNOSIS — D696 Thrombocytopenia, unspecified: Secondary | ICD-10-CM | POA: Diagnosis present

## 2013-02-05 LAB — GLUCOSE, CAPILLARY
GLUCOSE-CAPILLARY: 302 mg/dL — AB (ref 70–99)
GLUCOSE-CAPILLARY: 182 mg/dL — AB (ref 70–99)

## 2013-02-05 MED ORDER — INSULIN ASPART 100 UNIT/ML ~~LOC~~ SOLN
0.0000 [IU] | Freq: Three times a day (TID) | SUBCUTANEOUS | Status: DC
  Administered 2013-02-05: 4 [IU] via SUBCUTANEOUS

## 2013-02-05 MED ORDER — SUCRALFATE 1 GM/10ML PO SUSP: 1.0000 g | Freq: Four times a day (QID) | ORAL | Status: DC

## 2013-02-05 MED ORDER — LISINOPRIL 20 MG PO TABS: 20.0000 mg | ORAL_TABLET | Freq: Every day | ORAL | Status: AC

## 2013-02-05 MED ORDER — OXYCODONE-ACETAMINOPHEN 5-325 MG PO TABS: 1.0000 | ORAL_TABLET | Freq: Four times a day (QID) | ORAL | Status: DC | PRN

## 2013-02-05 MED ORDER — GLIPIZIDE 5 MG PO TABS: 5.0000 mg | ORAL_TABLET | Freq: Every day | ORAL | Status: DC

## 2013-02-05 MED ORDER — PANTOPRAZOLE SODIUM 40 MG PO TBEC: 40.0000 mg | DELAYED_RELEASE_TABLET | Freq: Every day | ORAL | Status: AC

## 2013-02-05 MED ORDER — AMLODIPINE BESYLATE 10 MG PO TABS
10.0000 mg | ORAL_TABLET | Freq: Every day | ORAL | Status: DC
  Administered 2013-02-05: 10 mg via ORAL
  Filled 2013-02-05: qty 1

## 2013-02-05 MED ORDER — INSULIN DETEMIR 100 UNIT/ML ~~LOC~~ SOLN
15.0000 [IU] | Freq: Every day | SUBCUTANEOUS | Status: DC
  Administered 2013-02-05: 15 [IU] via SUBCUTANEOUS
  Filled 2013-02-05: qty 0.15

## 2013-02-05 MED ORDER — AMLODIPINE BESYLATE 10 MG PO TABS: 10.0000 mg | ORAL_TABLET | Freq: Every day | ORAL | Status: DC

## 2013-02-05 MED ORDER — OXYCODONE HCL 5 MG/5ML PO SOLN: 5.0000 mg | ORAL | Status: DC | PRN

## 2013-02-05 MED ORDER — METFORMIN HCL 500 MG PO TABS: 1000.0000 mg | ORAL_TABLET | Freq: Two times a day (BID) | ORAL | Status: DC

## 2013-02-05 NOTE — Discharge Instructions (Signed)
Esophageal Varices °Esophageal varices are blood vessels in the esophagus (the tube that carries food to the stomach). Under normal circumstances, these blood vessels carry very small amounts of blood. If the liver is damaged, and the main vein (portal vein) that carries blood is blocked, larger amounts of blood might back up into these esophageal varices. The esophageal varices are too fragile for this extra blood flow and pressure. They may swell and then break, causing life-threatening bleeding (hemorrhage). °CAUSES  °Any kind of liver disease can cause esophageal varices. Cirrhosis of the liver, usually due to alcoholism, is the most common reason. Other reasons include: °· Severe heart failure: When the heart cannot pump blood around the body effectively enough, pressure may rise in the portal vein. °· A blood clot in the portal vein. °· Sarcoidosis. This is an inflammatory disease that can affect the liver. °· Schistosomiasis. A parasitic infection that can cause liver damage. °SYMPTOMS  °Symptoms may include: °· Vomiting bright red or black coffee ground like material. °· Black, tarry stools. °· Low blood pressure. °· Dizziness. °· Loss of consciousness. °DIAGNOSIS  °When someone has known cirrhosis, their caregiver may screen them for the presence of esophageal varices. Tests that are used include: °· Endoscopy (esophagogastroduodenoscopy or EGD). A thin, lighted tube is inserted through the mouth and into the esophagus. The caregiver will rate the varices according to their size and the presence of red streaks. These characteristics help determine the risk of bleeding. °· Imaging tests. CT scans and MRI scans can both show esophageal varices. However, they cannot predict likelihood of bleeding. °TREATMENT  °There are different types of treatment used for esophageal varices. These include: °· Variceal ligation. During EGD, the caregiver places a rubber band around the vein to prevent bleeding. °· Injection  therapy. During EGD, the caregiver can inject the veins with a solution that shrinks them and scars them closed. °· Medications can decrease the pressure in the esophageal varices and prevent bleeding. °· Balloon tamponade. A tube is put into the esophagus and a balloon is passed down it. When the balloon is inflated, it puts pressures on the veins and stops the bleeding. °· Shunt. A small tube is placed within the liver veins. This decreases the blood flow and pressure to the varices, decreasing bleeding risk. °· Liver transplant may be done as a last resort. °HOME CARE INSTRUCTIONS  °· Take all medications exactly as directed. °· Follow any prescribed diet. Avoid alcohol if recommended. °· Follow instructions regarding both rest and physical activity. °· Seek help to treat a drinking problem. °SEEK IMMEDIATE MEDICAL CARE IF: °· You vomit blood or coffee-ground material. °· You pass black tarry stools or bright red blood in the stools. °· You are dizzy, lightheaded or faint. °· You are unable to eat or drink. °· You experience chest pain. °Document Released: 04/08/2003 Document Revised: 04/10/2011 Document Reviewed: 03/19/2008 °ExitCare® Patient Information ©2014 [employer]. ° °

## 2013-02-05 NOTE — Progress Notes (Signed)
Discharge instructions reviewed with pt and prescriptions given.  Pt verbalized understanding and had no questions.  Pt discharged in stable condition via wheelchair with husband.  [name]   

## 2013-02-05 NOTE — Care Management Note (Signed)
    Page 1 of 1   02/05/2013     3:58:52 PM   CARE MANAGEMENT NOTE 02/05/2013  Patient:  Selena Bennett, Selena Bennett   Account Number:  [id number]  Date Initiated:  01/28/2013  Documentation initiated by:  Elissa Hefty  Subjective/Objective Assessment:   adm w gi bleed     Action/Plan:   lives ? alone, pcp dr Dalbert Mayotte   Anticipated DC Date:  02/03/2013   Anticipated DC Plan:  HOME/SELF CARE         Choice offered to / List presented to:             Status of service:  Completed, signed off Medicare Important Message given?   (If response is "NO", the following Medicare IM given date fields will be blank) Date Medicare IM given:   Date Additional Medicare IM given:    Discharge Disposition:  HOME/SELF CARE  Per UR Regulation:  Reviewed for med. necessity/level of care/duration of stay  If discussed at [place] of Stay Meetings, dates discussed:    Comments:  Contact:  Gattis,Brittney Daughter     [phone]                  Dellia Nims [phone]                  [place], [place]     [phone]   02-06-12 Consult for PCP . Patient states she already goes to [facility] in [place] for PCP . [place] letter ( medication assistance )  given and explained . Patient voices understanding .  Magdalen Spatz RN BSN [phone]

## 2013-02-05 NOTE — Discharge Summary (Addendum)
Physician Discharge Summary  Selena Bennett RJJ:[id number] DOB: September 22, 1965 DOA: 01/28/2013  PCP: Christian Mate, MD  Admit date: 01/28/2013 Discharge date: 02/05/2013  Time spent: 40 minutes  Recommendations for Outpatient Follow-up:  1. Home with outpt follow up with PCP and Dr Benson Norway 2. Needs h&H checked during outpt follow up in 1 week  Discharge Diagnoses:  Principal Problem:   Esophageal varices with hemorrhage  Active Problems:   GIB (gastrointestinal bleeding)   HTN (hypertension)   DM (diabetes mellitus)   Cirrhosis of liver without mention of alcohol   Acute blood loss anemia   Odynophagia   Liver lesion   Thrombocytopenia, unspecified   Discharge Condition: fair  Diet recommendation: diabetic  Filed Weights   01/30/13 0451 01/31/13 0500 02/01/13 1740  Weight: 96.2 kg (212 lb 1.3 oz) 99.1 kg (218 lb 7.6 oz) 92.3 kg (203 lb 7.8 oz)    History of present illness:  Please refer to admission h&p for details but in brief, 48 y/o feamle with uncontrolled DM, HTN, GERD presented to [facility] with syncope due to severe hematemesis (found in a pool of blood) and found to have a Hgb of 4.2. She was transferred to [facility] for a GI work up. She underwent banding of esophageal varices the same day by Dr Benson Norway. During this admission she has been discovered to have cirrhosis, the cause of which is possibly NASH. She reporters to have  never abused Alcohol.    Hospital Course:  Upper GI bleed/ Esophageal varices with hemorrhage  - s/p EGD with  banding on 12/30  - d/c'd Octreotide infusion  -cont daily Protonix per GI  - On levaquin for empiric coverage for translocation of bacteria during banding. completed course today.  Active Problems:  Thrombocytopenia  - currently low and stable at 107k. 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 High Anti SM abs, normal ceruloplasmin. AFP negative Follow up with Dr Benson Norway in 2 weeks. Discussed plan with him prior to discharge.  Liver nodule  - An MRI of liver shows cirrhosis without evidence of hepatoma.   Acute blood loss anemia  - Patient's hemoglobin improved from 6.8 to 9.0 , after 2 units of packed red blood cells were transfused again on 1/5. clinically feels better.    HTN (hypertension)  BP elevated. Lisinopril dose increased to 20 mg daily. Will discharge on amlodipine 10 mg daily as well. Follow as outpt  DM (diabetes mellitus)  Uncontrolled. A1C of 8.5 . Metformin on hold. fsg elevated while in hospital. i will increase her metformin dose to 1000 mg bid. patient also reports being on glipizide. does not remember dose. prescribed 5 mg po daily.  counseled on dietary adherence, medical compliance, weight loss and exercise.  patient refused to start insulin at this time.  Code Status: Full  Family Communication: with husband at bedside Disposition Plan: likely home with outpt follow up  Consultants:  GI ( Dr Benson Norway)  Procedures:  12/30- EGD- 7 band placed for esophageal varices   Antibiotics:  Rocephin 12/31>>  levaquin     Discharge Exam: Filed Vitals:   02/05/13 1302  BP: 146/68  Pulse: 74  Temp: 98.4 F (36.9 C)  Resp: 18    General: middle aged obese female in NAD  HEENT: no pallor, moist mucosa Chest : clear  B/l, no  added sounds CVS: NS1&S2, no murmurs, rubs or gallop abd : soft, NT, ND, BS+ Ext: warm, no edema  CNS: AAOX3   Discharge Instructions     Medication List         amLODipine 10 MG tablet  Commonly known as:  NORVASC  Take 1 tablet (10 mg total) by mouth daily.     glipiZIDE 5 MG tablet  Commonly known as:  GLUCOTROL  Take 1 tablet (5 mg total) by mouth daily before breakfast.     lisinopril 20 MG tablet  Commonly known as:  PRINIVIL,ZESTRIL  Take 1 tablet (20 mg total) by mouth daily.     metFORMIN 500 MG  tablet  Commonly known as:  GLUCOPHAGE  Take 2 tablets (1,000 mg total) by mouth 2 (two) times daily with a meal.     oxyCODONE-acetaminophen 5-325 MG per tablet  Commonly known as:  ROXICET  Take 1 tablet by mouth every 6 (six) hours as needed for severe pain.     pantoprazole 40 MG tablet  Commonly known as:  PROTONIX  Take 1 tablet (40 mg total) by mouth daily.     sucralfate 1 GM/10ML suspension  Commonly known as:  CARAFATE  Take 10 mLs (1 g total) by mouth every 6 (six) hours.       No Known Allergies     Follow-up Information   Follow up with [facility] In 1 week. ( needs H&H checked)    Specialty:  General Practice   Contact information:   [place] [place] [address] [phone]       Follow up with HUNG,PATRICK D, MD. Schedule an appointment as soon as possible for a visit in 2 weeks.   Specialty:  Gastroenterology   Contact information:   [address] [place] [address] [phone]        The results of significant diagnostics from this hospitalization (including imaging, microbiology, ancillary and laboratory) are listed below for reference.    Significant Diagnostic Studies: Mr Liver W Wo Contrast  Feb 25, 2013   CLINICAL DATA:  Evaluate liver lesion  EXAM: MRI ABDOMEN WITHOUT AND WITH CONTRAST  TECHNIQUE: Multiplanar, multisequence MR imaging was performed both before and after administration of intravenous contrast.  CONTRAST:  10 cc of Eovist  COMPARISON:  None.  FINDINGS: There is no pleural effusion identified. The liver has a irregular and nodular contour compatible with the history of cirrhosis. There is no enhancing liver abnormality. Previous cholecystectomy. No significant biliary dilatation. Normal appearance of the pancreas. The spleen is enlarged measuring 16 cm in cranial caudal dimension.  The adrenal glands are both normal. Normal appearance of the kidneys.  Esophageal varices  are identified.  No upper abdominal adenopathy. Gastrohepatic ligament lymph node is prominent measuring 9 mm, image 47 of series 502.  There is normal signal from within the bone marrow.  IMPRESSION: 1. Cirrhosis, esophageal varices and splenomegaly. 2. No specific features identified to suggest hepatoma.   Electronically Signed   By: Kerby Moors M.D.   On: 02-25-2013 13:42   Dg Chest Port 1 View  01/29/2013   CLINICAL DATA:  Mid chest pain, shortness of breath  EXAM: PORTABLE CHEST - 1 VIEW  COMPARISON:  01/28/2013  FINDINGS: Grossly unchanged cardiac silhouette and mediastinal contours given persistently reduced lung volumes. Stable positioning of support apparatus. No change to slight worsening of bilateral heterogeneous opacities, left greater than right. Mild pulmonary venous congestion without frank  evidence of edema. There is a minimal amount of pleural parenchymal thickening about the right minor fissure, unchanged. No pleural effusion or pneumothorax. Unchanged bones.  IMPRESSION: 1. Persistently reduced lung volumes with no change to slight worsening of perihilar opacities, left greater than right, likely atelectasis. 2. Borderline cardiomegaly and findings suggestive of pulmonary venous congestion. No definite evidence of edema.   Electronically Signed   By: Sandi Mariscal M.D.   On: 01/29/2013 09:04   Dg Chest Port 1 View  01/28/2013   CLINICAL DATA:  Central line placement  EXAM: PORTABLE CHEST - 1 VIEW  COMPARISON:  None.  FINDINGS: Left IJ approach central venous catheter. The catheter tip is at the superior cavoatrial junction. No evidence of complicating pneumothorax. Lung volumes are low and there is trace bibasilar atelectasis. No pulmonary edema, pleural effusion or pneumothorax. The cardiac silhouette appears at the upper limits of normal but is likely exaggerated by portable frontal technique.  IMPRESSION: 1. The tip of the left IJ approach central venous catheter is at the superior  cavoatrial junction. No evidence of a pneumothorax. 2. Borderline enlargement of the cardiopericardial silhouette is likely exaggerated by portable frontal technique. 3. Low lung volumes with minimal bibasilar atelectasis. Otherwise, the lungs are clear. These results were called by telephone at the time of interpretation on 01/28/2013 at 12:46 PM to nurse Candace, who verbally acknowledged these results.   Electronically Signed   By: Jacqulynn Cadet M.D.   On: 01/28/2013 12:46   US Abdomen Limited Ruq  01/28/2013   CLINICAL DATA:  GI bleed. History of cholecystectomy. Question cirrhosis.  EXAM: US ABDOMEN LIMITED - RIGHT UPPER QUADRANT  COMPARISON:  None.  FINDINGS: Gallbladder  Surgically absent.  Common bile duct  Diameter: 6.5 mm.  No intraductal calculi identified.  Liver:  The liver is diffusely heterogeneous in echotexture and incompletely visualized. There is a central echogenic lesion measuring approximately 3.2 x 2.1 x 1.6 cm. The portal vein is patent with hepatopetal flow. No ascites is documented.  IMPRESSION: 1. No biliary dilatation status post cholecystectomy. 2. Diffusely heterogeneous hepatic parenchyma with central echogenic lesion. These findings are nonspecific and could be secondary to heterogeneous steatosis. However, with the clinical suspicion of cirrhosis, further evaluation is recommended to exclude infiltrating neoplasm. Ideally, MRI without and with contrast would be performed. If the patient cannot undergo MRI, abdominal CT would be recommended.   Electronically Signed   By: Camie Patience M.D.   On: 01/28/2013 21:45    Microbiology: Recent Results (from the past 240 hour(s))  MRSA PCR SCREENING     Status: None   Collection Time    01/28/13 11:00 AM      Result Value Range Status   MRSA by PCR NEGATIVE  NEGATIVE Final   Comment:            The GeneXpert MRSA Assay (FDA     approved for NASAL specimens     only), is one component of a     comprehensive MRSA  colonization     surveillance program. It is not     intended to diagnose MRSA     infection nor to guide or     monitor treatment for     MRSA infections.     Labs: Basic Metabolic Panel:  Recent Labs Lab 01/31/13 0450 02/02/13 1225 02/03/13 0545 02/04/13 0520  NA 141 138 140 138  K 3.8 3.6* 3.5* 3.7  CL 109 105 106 102  CO2 23  25 25 24   GLUCOSE 180* 297* 221* 318*  BUN 12 8 9 10   CREATININE 1.05 0.95 0.98 0.94  CALCIUM 7.4* 7.8* 7.7* 8.1*   Liver Function Tests: No results found for this basename: AST, ALT, ALKPHOS, BILITOT, PROT, ALBUMIN,  in the last 168 hours No results found for this basename: LIPASE, AMYLASE,  in the last 168 hours No results found for this basename: AMMONIA,  in the last 168 hours CBC:  Recent Labs Lab 01/31/13 0450 01/31/13 1700 02/01/13 0530 02/02/13 1225 02/03/13 1059 02/04/13 0520  WBC 8.0  --  4.5 4.0 4.6 4.2  HGB 7.0* 7.7* 7.2* 6.8* 9.0* 8.9*  HCT 21.5* 23.8* 22.5* 20.9* 26.4* 27.1*  MCV 83.3  --  84.3 82.6 82.2 82.6  PLT 113*  --  93* 104* 107* 107*   Cardiac Enzymes: No results found for this basename: CKTOTAL, CKMB, CKMBINDEX, TROPONINI,  in the last 168 hours BNP: BNP (last 3 results) No results found for this basename: PROBNP,  in the last 8760 hours CBG:  Recent Labs Lab 02/04/13 0755 02/04/13 1234 02/04/13 1711 02/05/13 0742 02/05/13 1143  GLUCAP 254* 154* 315* 302* 182*       Signed:  Cecillia Menees  [facility] 02/05/2013, 2:57 PM

## 2013-02-17 ENCOUNTER — Other Ambulatory Visit: Payer: Self-pay | Admitting: Gastroenterology

## 2013-02-21 ENCOUNTER — Ambulatory Visit (HOSPITAL_COMMUNITY)
Admission: RE | Admit: 2013-02-21 | Discharge: 2013-02-21 | Disposition: A | Discharge status: Home / Self Care | Payer: PRIVATE HEALTH INSURANCE | Source: Ambulatory Visit | Attending: Gastroenterology | Admitting: Gastroenterology

## 2013-02-21 ENCOUNTER — Encounter (HOSPITAL_COMMUNITY): Payer: Self-pay | Admitting: *Deleted

## 2013-02-21 ENCOUNTER — Encounter (HOSPITAL_COMMUNITY)
Admission: RE | Disposition: A | Discharge status: Home / Self Care | Payer: Self-pay | Source: Ambulatory Visit | Attending: Gastroenterology

## 2013-02-21 DIAGNOSIS — K766 Portal hypertension: Secondary | ICD-10-CM | POA: Insufficient documentation

## 2013-02-21 DIAGNOSIS — K219 Gastro-esophageal reflux disease without esophagitis: Secondary | ICD-10-CM | POA: Insufficient documentation

## 2013-02-21 DIAGNOSIS — E119 Type 2 diabetes mellitus without complications: Secondary | ICD-10-CM | POA: Insufficient documentation

## 2013-02-21 DIAGNOSIS — K7460 Unspecified cirrhosis of liver: Secondary | ICD-10-CM | POA: Insufficient documentation

## 2013-02-21 DIAGNOSIS — K319 Disease of stomach and duodenum, unspecified: Secondary | ICD-10-CM | POA: Insufficient documentation

## 2013-02-21 DIAGNOSIS — I8510 Secondary esophageal varices without bleeding: Secondary | ICD-10-CM | POA: Insufficient documentation

## 2013-02-21 DIAGNOSIS — I868 Varicose veins of other specified sites: Secondary | ICD-10-CM | POA: Insufficient documentation

## 2013-02-21 DIAGNOSIS — I10 Essential (primary) hypertension: Secondary | ICD-10-CM | POA: Insufficient documentation

## 2013-02-21 DIAGNOSIS — K7689 Other specified diseases of liver: Secondary | ICD-10-CM | POA: Insufficient documentation

## 2013-02-21 HISTORY — PX: ESOPHAGEAL BANDING: SHX5518

## 2013-02-21 HISTORY — PX: ESOPHAGOGASTRODUODENOSCOPY: SHX5428

## 2013-02-21 SURGERY — EGD (ESOPHAGOGASTRODUODENOSCOPY)
Anesthesia: Moderate Sedation

## 2013-02-21 MED ORDER — MIDAZOLAM HCL 10 MG/2ML IJ SOLN
INTRAMUSCULAR | Status: AC
  Filled 2013-02-21: qty 2

## 2013-02-21 MED ORDER — SODIUM CHLORIDE 0.9 % IV SOLN
INTRAVENOUS | Status: DC
  Administered 2013-02-21: 10:00:00 via INTRAVENOUS

## 2013-02-21 MED ORDER — BUTAMBEN-TETRACAINE-BENZOCAINE 2-2-14 % EX AERO
INHALATION_SPRAY | CUTANEOUS | Status: DC | PRN
  Administered 2013-02-21: 1 via TOPICAL

## 2013-02-21 MED ORDER — FENTANYL CITRATE 0.05 MG/ML IJ SOLN
INTRAMUSCULAR | Status: DC | PRN
  Administered 2013-02-21 (×5): 25 ug via INTRAVENOUS

## 2013-02-21 MED ORDER — FENTANYL CITRATE 0.05 MG/ML IJ SOLN
INTRAMUSCULAR | Status: AC
  Filled 2013-02-21: qty 4

## 2013-02-21 MED ORDER — MIDAZOLAM HCL 10 MG/2ML IJ SOLN
INTRAMUSCULAR | Status: DC | PRN
  Administered 2013-02-21 (×5): 2 mg via INTRAVENOUS

## 2013-02-21 NOTE — H&P (View-Only) (Signed)
[facility] Progress Note [place] TEAM 1 - Stepdown/ICU TEAM   Selena Bennett KGU:[id number] DOB: Nov 13, 1965 DOA: 01/28/2013 PCP: Christian Mate, MD    Brief narrative: 48 y/o feamle with DM, HTN, GERD presented to [facility] with syncope due to severe hematemesis (found in a poor of blood) and found to have a Hgb of 4.2. She was transferred to [facility] for a GI work up. She underwent banding of esophageal varices the same day by Dr Benson Norway. During this admission she has been discovered to have cirrhosis, the cause of which is still undetermined. She and her husband agree that she has never abused Alcohol.    Subjective: Patient was administered 2 mg of IV Ativan for MRI for sedation as she reported being claustrophobic, during my evaluation she was sedated however arousable.  Assessment/Plan: Principal Problem:   GIB / Esophageal varices with hemorrhage - s/p banding on 12/30 - d/c'd Octreotide infusion -cont daily Protonix per GI - On levaquin for empiric coverage for translocation of bacteria during banding-   Active Problems:   Thrombocytopenia - Clinic at remained stable, as were his laboratory platelet count of 107,000.    Cirrhosis of liver without mention of alcohol - work up per GI- hepatitis serologies negative  Liver nodule - An MRI of liver shows cirrhosis without evidence of hepatoma.    Acute blood loss anemia - Patient's hemoglobin improved from 6.8 to 9.0 on this mornings lab work, after 2 units of packed red blood cells were transfused yesterday. She states feeling better overall after transfusion.    HTN (hypertension) - Blood pressures were elevated for which her lisinopril was increased to 20 mg by mouth daily. I noted that her blood pressures remain in the 150s to 160s this afternoon for which I have added Norvasc 5 mg by mouth daily.     DM (diabetes mellitus) - holding Metformin- cont sliding scale insulin   Code Status: Full Family  Communication: with husband on 1/1 Disposition Plan: likely home  Consultants: GI  Procedures: 12/30- EGD- 7 band placed for esophageal varices  Antibiotics: Rocephin 12/31>>  DVT prophylaxis: SCDs  Objective: Blood pressure 169/83, pulse 73, temperature 98.6 F (37 C), temperature source Oral, resp. rate 18, height 5\' 5"  (1.651 m), weight 92.3 kg (203 lb 7.8 oz), last menstrual period 01/13/2013, SpO2 100.00%.  Intake/Output Summary (Last 24 hours) at 02/04/13 1923 Last data filed at 02/04/13 1718  Gross per 24 hour  Intake    240 ml  Output      0 ml  Net    240 ml     Exam: General: No acute respiratory distress Lungs: Clear to auscultation bilaterally without wheezes or crackles Cardiovascular: Regular rate and rhythm without murmur gallop or rub normal S1 and S2 Abdomen: Nontender, nondistended, soft, bowel sounds positive, no rebound, no ascites, no appreciable mass Extremities: No significant cyanosis, clubbing, or edema bilateral lower extremities  Data Reviewed: Basic Metabolic Panel:  Recent Labs Lab 01/29/13 0220 01/31/13 0450 02/02/13 1225 02/03/13 0545 02/04/13 0520  NA 143 141 138 140 138  K 4.4 3.8 3.6* 3.5* 3.7  CL 111 109 105 106 102  CO2 23 23 25 25 24   GLUCOSE 135* 180* 297* 221* 318*  BUN 27* 12 8 9 10   CREATININE 1.05 1.05 0.95 0.98 0.94  CALCIUM 7.5* 7.4* 7.8* 7.7* 8.1*   Liver Function Tests:  Recent Labs Lab 01/29/13 0220  AST 16  ALT 11  ALKPHOS 76  BILITOT 0.9  PROT 5.5*  ALBUMIN 2.3*   No results found for this basename: LIPASE, AMYLASE,  in the last 168 hours No results found for this basename: AMMONIA,  in the last 168 hours CBC:  Recent Labs Lab 01/31/13 0450 01/31/13 1700 02/01/13 0530 02/02/13 1225 02/03/13 1059 02/04/13 0520  WBC 8.0  --  4.5 4.0 4.6 4.2  HGB 7.0* 7.7* 7.2* 6.8* 9.0* 8.9*  HCT 21.5* 23.8* 22.5* 20.9* 26.4* 27.1*  MCV 83.3  --  84.3 82.6 82.2 82.6  PLT 113*  --  93* 104* 107* 107*    Cardiac Enzymes:  Recent Labs Lab 01/28/13 1930 01/29/13 0130  TROPONINI <0.30 <0.30   BNP (last 3 results) No results found for this basename: PROBNP,  in the last 8760 hours CBG:  Recent Labs Lab 02/03/13 1710 02/03/13 2222 02/04/13 0755 02/04/13 1234 02/04/13 1711  GLUCAP 206* 297* 254* 154* 315*    Recent Results (from the past 240 hour(s))  MRSA PCR SCREENING     Status: None   Collection Time    01/28/13 11:00 AM      Result Value Range Status   MRSA by PCR NEGATIVE  NEGATIVE Final   Comment:            The GeneXpert MRSA Assay (FDA     approved for NASAL specimens     only), is one component of a     comprehensive MRSA colonization     surveillance program. It is not     intended to diagnose MRSA     infection nor to guide or     monitor treatment for     MRSA infections.     Studies:  Recent x-ray studies have been reviewed in detail by the Attending Physician  Scheduled Meds:  Scheduled Meds: . influenza vac split quadrivalent PF  0.5 mL Intramuscular Tomorrow-1000  . insulin aspart  0-20 Units Subcutaneous TID WC  . levofloxacin  750 mg Oral Daily  . lisinopril  20 mg Oral Daily  . pantoprazole  40 mg Oral Q0600  . sodium chloride  10-40 mL Intracatheter Q12H  . sucralfate  1 g Oral Q6H   Continuous Infusions:    Time spent on care of this patient: 35 min   Kelvin Cellar, MD  [facility] Office  [phone] Pager - Text Page per Shea Evans as per below:  On-Call/Text Page:      [email or web address]      password [email or web address]  If 7PM-7AM, please contact night-coverage [email or web address] Password [email or web address] 02/04/2013, 7:23 PM   LOS: 7 days

## 2013-02-21 NOTE — Discharge Instructions (Signed)
Gastrointestinal Endoscopy °Care After °Refer to this sheet in the next few weeks. These instructions provide you with information on caring for yourself after your procedure. Your caregiver may also give you more specific instructions. Your treatment has been planned according to current medical practices, but problems sometimes occur. Call your caregiver if you have any problems or questions after your procedure. °HOME CARE INSTRUCTIONS °· If you were given medicine to help you relax (sedative), do not drive, operate machinery, or sign important documents for 24 hours. °· Avoid alcohol and hot or warm beverages for the first 24 hours after the procedure. °· Only take over-the-counter or prescription medicines for pain, discomfort, or fever as directed by your caregiver. You may resume taking your normal medicines unless your caregiver tells you otherwise. Ask your caregiver when you may resume taking medicines that may cause bleeding, such as aspirin, clopidogrel, or warfarin. °· You may return to your normal diet and activities on the day after your procedure, or as directed by your caregiver. Walking may help to reduce any bloated feeling in your abdomen. °· Drink enough fluids to keep your urine clear or pale yellow. °· You may gargle with salt water if you have a sore throat. °SEEK IMMEDIATE MEDICAL CARE IF: °· You have severe nausea or vomiting. °· You have severe abdominal pain, abdominal cramps that last longer than 6 hours, or abdominal swelling (distention). °· You have severe shoulder or back pain. °· You have trouble swallowing. °· You have shortness of breath, your breathing is shallow, or you are breathing faster than normal. °· You have a fever or a rapid heartbeat. °· You vomit blood or material that looks like coffee grounds. °· You have bloody, black, or tarry stools. °MAKE SURE YOU: °· Understand these instructions. °· Will watch your condition. °· Will get help right away if you are not doing  well or get worse. °Document Released: 08/31/2003 Document Revised: 07/18/2011 Document Reviewed: 04/18/2011 °ExitCare® Patient Information ©2014 [employer]. ° °

## 2013-02-21 NOTE — Interval H&P Note (Signed)
History and Physical Interval Note:  02/21/2013 8:57 AM  Selena Bennett  has presented today for surgery, with the diagnosis of esophageal varices  The various methods of treatment have been discussed with the patient and family. After consideration of risks, benefits and other options for treatment, the patient has consented to  Procedure(s): ESOPHAGOGASTRODUODENOSCOPY (EGD) (N/A) ESOPHAGEAL BANDING (N/A) as a surgical intervention .  The patient's history has been reviewed, patient examined, no change in status, stable for surgery.  I have reviewed the patient's chart and labs.  Questions were answered to the patient's satisfaction.     Willmer Fellers D

## 2013-02-21 NOTE — Op Note (Signed)
[facility] [place], [address]   OPERATIVE PROCEDURE REPORT  PATIENT: Selena, Bennett.  MR#: [id number] BIRTHDATE: May 06, 1965  GENDER: Female ENDOSCOPIST: Carol Ada, MD ASSISTANT:   Cleda Daub, RN CGRN and Corliss Parish, technician PROCEDURE DATE: 02/21/2013 PROCEDURE:   EGD w/ band ligation of varices ASA CLASS:   Class III INDICATIONS: Esophageal varices MEDICATIONS: Versed 10 mg IV and Fentanyl 100 mcg IV TOPICAL ANESTHETIC:   none  DESCRIPTION OF PROCEDURE:   After the risks benefits and alternatives of the procedure were thoroughly explained, informed consent was obtained.  The     endoscope was introduced through the mouth  and advanced to the second portion of the duodenum Without limitations.      The instrument was slowly withdrawn as the mucosa was fully examined.      FINDINGS: In the distal esophague varices were identified and they were much more improved than anticipated.  The varices appaered small to medium-sized.  Five out of seven bands were successfully deployed.  Two fo the bands did not remain on the mucosa.  A couple of small post banding ulcerations were noted in the distal esophagus.  The gastric lumen exhibited portal HTN gastropathy and the fundic varices were much smaller.  No other abnormalities noted in the upper GI tract..   Retroflexed views revealed the smalller fundic varices.     The scope was then withdrawn from the patient and the procedure terminated.  COMPLICATIONS: There were no complications. IMPRESSION: 1) Small-mediunm sized esophageal varices s/p banding. 2) Portal HTN gastropathy. 3) Smaller fundic varices.  RECOMMENDATIONS: 1) Repeat EGD with banding in 3-4 weeks.  _______________________________ eSignedCarol Ada, MD 02/21/2013 10:18 AM

## 2013-02-24 ENCOUNTER — Encounter (HOSPITAL_COMMUNITY): Payer: Self-pay | Admitting: Gastroenterology

## 2013-03-04 ENCOUNTER — Other Ambulatory Visit: Payer: Self-pay | Admitting: Internal Medicine

## 2013-03-06 ENCOUNTER — Other Ambulatory Visit: Payer: Self-pay | Admitting: Internal Medicine

## 2013-03-17 ENCOUNTER — Other Ambulatory Visit: Payer: Self-pay | Admitting: Gastroenterology

## 2013-03-20 ENCOUNTER — Encounter (HOSPITAL_COMMUNITY): Payer: Self-pay | Admitting: *Deleted

## 2013-03-21 ENCOUNTER — Encounter (HOSPITAL_COMMUNITY)
Admission: RE | Disposition: A | Discharge status: Home / Self Care | Payer: Self-pay | Source: Ambulatory Visit | Attending: Gastroenterology

## 2013-03-21 ENCOUNTER — Ambulatory Visit (HOSPITAL_COMMUNITY): Payer: Self-pay | Admitting: Anesthesiology

## 2013-03-21 ENCOUNTER — Ambulatory Visit (HOSPITAL_COMMUNITY)
Admission: RE | Admit: 2013-03-21 | Discharge: 2013-03-21 | Disposition: A | Discharge status: Home / Self Care | Payer: Self-pay | Source: Ambulatory Visit | Attending: Gastroenterology | Admitting: Gastroenterology

## 2013-03-21 ENCOUNTER — Encounter (HOSPITAL_COMMUNITY): Payer: Self-pay | Admitting: *Deleted

## 2013-03-21 ENCOUNTER — Encounter (HOSPITAL_COMMUNITY): Payer: MEDICAID | Admitting: Anesthesiology

## 2013-03-21 DIAGNOSIS — Z9089 Acquired absence of other organs: Secondary | ICD-10-CM | POA: Insufficient documentation

## 2013-03-21 DIAGNOSIS — I10 Essential (primary) hypertension: Secondary | ICD-10-CM | POA: Insufficient documentation

## 2013-03-21 DIAGNOSIS — K219 Gastro-esophageal reflux disease without esophagitis: Secondary | ICD-10-CM | POA: Insufficient documentation

## 2013-03-21 DIAGNOSIS — E119 Type 2 diabetes mellitus without complications: Secondary | ICD-10-CM | POA: Insufficient documentation

## 2013-03-21 DIAGNOSIS — D649 Anemia, unspecified: Secondary | ICD-10-CM | POA: Insufficient documentation

## 2013-03-21 DIAGNOSIS — K766 Portal hypertension: Secondary | ICD-10-CM | POA: Insufficient documentation

## 2013-03-21 DIAGNOSIS — K2210 Ulcer of esophagus without bleeding: Secondary | ICD-10-CM | POA: Insufficient documentation

## 2013-03-21 DIAGNOSIS — K7460 Unspecified cirrhosis of liver: Secondary | ICD-10-CM | POA: Insufficient documentation

## 2013-03-21 DIAGNOSIS — I8510 Secondary esophageal varices without bleeding: Secondary | ICD-10-CM | POA: Insufficient documentation

## 2013-03-21 HISTORY — DX: Anemia, unspecified: D64.9

## 2013-03-21 HISTORY — PX: ESOPHAGOGASTRODUODENOSCOPY: SHX5428

## 2013-03-21 HISTORY — DX: Gastro-esophageal reflux disease without esophagitis: K21.9

## 2013-03-21 HISTORY — PX: ESOPHAGEAL BANDING: SHX5518

## 2013-03-21 LAB — GLUCOSE, CAPILLARY: Glucose-Capillary: 138 mg/dL — ABNORMAL HIGH (ref 70–99)

## 2013-03-21 SURGERY — EGD (ESOPHAGOGASTRODUODENOSCOPY)
Anesthesia: Monitor Anesthesia Care

## 2013-03-21 MED ORDER — FENTANYL CITRATE 0.05 MG/ML IJ SOLN
INTRAMUSCULAR | Status: AC
  Filled 2013-03-21: qty 2

## 2013-03-21 MED ORDER — SODIUM CHLORIDE 0.9 % IV SOLN: INTRAVENOUS | Status: DC

## 2013-03-21 MED ORDER — KETAMINE HCL 10 MG/ML IJ SOLN
INTRAMUSCULAR | Status: DC | PRN
  Administered 2013-03-21: 25 mg via INTRAVENOUS

## 2013-03-21 MED ORDER — ONDANSETRON HCL 4 MG/2ML IJ SOLN
INTRAMUSCULAR | Status: DC | PRN
  Administered 2013-03-21: 2 mg via INTRAVENOUS

## 2013-03-21 MED ORDER — LACTATED RINGERS IV SOLN
INTRAVENOUS | Status: DC
  Administered 2013-03-21: 1000 mL via INTRAVENOUS

## 2013-03-21 MED ORDER — PROPOFOL 10 MG/ML IV BOLUS
INTRAVENOUS | Status: AC
  Filled 2013-03-21: qty 20

## 2013-03-21 MED ORDER — MIDAZOLAM HCL 2 MG/2ML IJ SOLN
INTRAMUSCULAR | Status: AC
  Filled 2013-03-21: qty 2

## 2013-03-21 MED ORDER — ONDANSETRON HCL 4 MG/2ML IJ SOLN
INTRAMUSCULAR | Status: AC
  Filled 2013-03-21: qty 2

## 2013-03-21 MED ORDER — KETAMINE HCL 10 MG/ML IJ SOLN
INTRAMUSCULAR | Status: AC
  Filled 2013-03-21: qty 1

## 2013-03-21 MED ORDER — LACTATED RINGERS IV SOLN
INTRAVENOUS | Status: DC | PRN
  Administered 2013-03-21: 09:00:00 via INTRAVENOUS

## 2013-03-21 MED ORDER — PROPOFOL INFUSION 10 MG/ML OPTIME
INTRAVENOUS | Status: DC | PRN
  Administered 2013-03-21: 100 ug/kg/min via INTRAVENOUS

## 2013-03-21 MED ORDER — FENTANYL CITRATE 0.05 MG/ML IJ SOLN
INTRAMUSCULAR | Status: DC | PRN
  Administered 2013-03-21: 25 ug via INTRAVENOUS
  Administered 2013-03-21: 75 ug via INTRAVENOUS

## 2013-03-21 MED ORDER — MIDAZOLAM HCL 5 MG/5ML IJ SOLN
INTRAMUSCULAR | Status: DC | PRN
  Administered 2013-03-21: 1 mg via INTRAVENOUS

## 2013-03-21 NOTE — Discharge Instructions (Signed)

## 2013-03-21 NOTE — Transfer of Care (Signed)
Immediate Anesthesia Transfer of Care Note  Patient: Selena Bennett  Procedure(s) Performed: Procedure(s): ESOPHAGOGASTRODUODENOSCOPY (EGD) (N/A) ESOPHAGEAL BANDING (N/A)  Patient Location: PACU  Anesthesia Type:General  Level of Consciousness: awake and sedated  Airway & Oxygen Therapy: Patient Spontanous Breathing and Patient connected to nasal cannula oxygen  Post-op Assessment: Report given to PACU RN and Post -op Vital signs reviewed and stable  Post vital signs: stable  Complications: No apparent anesthesia complications

## 2013-03-21 NOTE — Op Note (Signed)
[facility] [place], [address]   OPERATIVE PROCEDURE REPORT  PATIENT: Selena Bennett, Selena Bennett.  MR#: [id number] BIRTHDATE: 10/18/1965  GENDER: Female ENDOSCOPIST: Carol Ada, MD ASSISTANT:   Carolynn Comment, technician and Estelle June, RN PROCEDURE DATE: 03/21/2013 PROCEDURE:   EGD, diagnostic ASA CLASS:   Class III INDICATIONS:Treatment of esophageal varices MEDICATIONS: MAC sedation, administered by CRNA TOPICAL ANESTHETIC:   None  DESCRIPTION OF PROCEDURE:   After the risks benefits and alternatives of the procedure were thoroughly explained, informed consent was obtained.  The Pentax Gastroscope [id number]  endoscope was introduced through the mouth  and advanced to the second portion of the duodenum Without limitations.      The instrument was slowly withdrawn as the mucosa was fully examined.      FINDINGS: In the distal esophagus there was evidence of scarring and a shallow nonbleeding ulceration from the banding.  The esophageal varices were small and no banding was required.  Fundic varices were identified.  Portal hypertensive gastropathy was again noted. Retroflexed views revealed no abnormalities.     The scope was then withdrawn from the patient and the procedure terminated.  COMPLICATIONS: There were no complications. IMPRESSION: 1) Small esophageal varices. 2) No bleeding banding ulcer. 3) Fundic varices. 4) Portal HTN gastropathy.  RECOMMENDATIONS: 1) RTC in one month.  _______________________________ eSigned:  Carol Ada, MD 03/21/2013 10:07 AM

## 2013-03-21 NOTE — Anesthesia Postprocedure Evaluation (Signed)
Anesthesia Post Note  Patient: Selena Bennett  Procedure(s) Performed: Procedure(s) (LRB): ESOPHAGOGASTRODUODENOSCOPY (EGD) (N/A) ESOPHAGEAL BANDING (N/A)  Anesthesia type: MAC  Patient location: PACU  Post pain: Pain level controlled  Post assessment: Post-op Vital signs reviewed  Last Vitals: BP 140/79  Pulse 83  Temp(Src) 36.8 C (Oral)  Resp 19  Ht 5\' 3"  (1.6 m)  Wt 196 lb (88.905 kg)  BMI 34.73 kg/m2  SpO2 94%  LMP 03/06/2013  Post vital signs: Reviewed  Level of consciousness: awake  Complications: No apparent anesthesia complications

## 2013-03-21 NOTE — H&P (Signed)
  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  She was recently hospitalized for a severe esophageal variceal bleed.  She had banding in the hospital and a few weeks ago she had an outpatient banding.  Today she is here to undergo repeat banding.  Past Medical History  Diagnosis Date  . Diabetes   . Hypertension   . Esophageal varices with hemorrhage 01/30/2013  . Anemia     [facility] 02-04-13 acute blood loss  . GERD (gastroesophageal reflux disease)     Past Surgical History  Procedure Laterality Date  . Cholecystectomy open    . Esophagogastroduodenoscopy N/A 01/28/2013    Procedure: ESOPHAGOGASTRODUODENOSCOPY (EGD);  Surgeon: Beryle Beams, MD;  Location: [facility] ENDOSCOPY;  Service: Endoscopy;  Laterality: N/A;  bedside  . Esophagogastroduodenoscopy N/A 02/21/2013    Procedure: ESOPHAGOGASTRODUODENOSCOPY (EGD);  Surgeon: Beryle Beams, MD;  Location: [facility] ENDOSCOPY;  Service: Endoscopy;  Laterality: N/A;  . Esophageal banding N/A 02/21/2013    Procedure: ESOPHAGEAL BANDING;  Surgeon: Beryle Beams, MD;  Location: [facility] ENDOSCOPY;  Service: Endoscopy;  Laterality: N/A;  . C section x1      1989    History reviewed. No pertinent family history.  Social History:  reports that she has never smoked. She has never used smokeless tobacco. She reports that she does not drink alcohol or use illicit drugs.  Allergies: No Known Allergies  Medications:  Scheduled:  Continuous: . sodium chloride      No results found for this or any previous visit (from the past 24 hour(s)).   No results found.  ROS:  As stated above in the HPI otherwise negative.  Blood pressure 140/82, pulse 83, temperature 98.5 F (36.9 C), temperature source Oral, resp. rate 18, height 5\' 3"  (1.6 m), weight 196 lb (88.905 kg), last menstrual period 03/06/2013, SpO2 97.00%.    PE: Gen: NAD, Alert and Oriented HEENT:  [place]/AT, EOMI Neck: Supple, no LAD Lungs: CTA Bilaterally CV: RRR without  M/G/R ABM: Soft, NTND, +BS Ext: No C/C/E  Assessment/Plan: 1) Esophageal varices. 2) NASH cirrhosis.  Plan: 1) Repeat banding.  Selena Bennett D 03/21/2013, 9:32 AM

## 2013-03-21 NOTE — Anesthesia Preprocedure Evaluation (Addendum)
Anesthesia Evaluation  Patient identified by MRN, date of birth, ID band Patient awake    Reviewed: Allergy & Precautions, H&P , NPO status , Patient's Chart, lab work & pertinent test results  Airway Mallampati: II TM Distance: >3 FB     Dental  (+) Dental Advisory Given   Pulmonary neg pulmonary ROS,  breath sounds clear to auscultation        Cardiovascular hypertension, Pt. on medications negative cardio ROS  Rhythm:Regular Rate:Normal     Neuro/Psych negative neurological ROS  negative psych ROS   GI/Hepatic negative GI ROS, (+) Cirrhosis -  Esophageal Varices     ,   Endo/Other  negative endocrine ROSdiabetes, Type 2, Oral Hypoglycemic Agents  Renal/GU negative Renal ROS     Musculoskeletal negative musculoskeletal ROS (+)   Abdominal   Peds  Hematology  (+) anemia ,   Anesthesia Other Findings   Reproductive/Obstetrics negative OB ROS                           Anesthesia Physical Anesthesia Plan  ASA: III  Anesthesia Plan: MAC   Post-op Pain Management:    Induction:   Airway Management Planned:   Additional Equipment:   Intra-op Plan:   Post-operative Plan:   Informed Consent: I have reviewed the patients History and Physical, chart, labs and discussed the procedure including the risks, benefits and alternatives for the proposed anesthesia with the patient or authorized representative who has indicated his/her understanding and acceptance.   Dental advisory given  Plan Discussed with: CRNA  Anesthesia Plan Comments:         Anesthesia Quick Evaluation

## 2013-03-24 ENCOUNTER — Encounter (HOSPITAL_COMMUNITY): Payer: Self-pay | Admitting: Gastroenterology

## 2013-04-21 ENCOUNTER — Emergency Department (HOSPITAL_COMMUNITY): Payer: Self-pay

## 2013-04-21 ENCOUNTER — Encounter (HOSPITAL_COMMUNITY): Payer: Self-pay | Admitting: Emergency Medicine

## 2013-04-21 ENCOUNTER — Inpatient Hospital Stay (HOSPITAL_COMMUNITY)
Admission: EM | Admit: 2013-04-21 | Discharge: 2013-04-23 | DRG: 378 | Disposition: A | Discharge status: Home / Self Care | Payer: Self-pay | Attending: Family Medicine | Admitting: Family Medicine

## 2013-04-21 DIAGNOSIS — K922 Gastrointestinal hemorrhage, unspecified: Principal | ICD-10-CM

## 2013-04-21 DIAGNOSIS — K219 Gastro-esophageal reflux disease without esophagitis: Secondary | ICD-10-CM | POA: Diagnosis present

## 2013-04-21 DIAGNOSIS — D696 Thrombocytopenia, unspecified: Secondary | ICD-10-CM

## 2013-04-21 DIAGNOSIS — K7689 Other specified diseases of liver: Secondary | ICD-10-CM | POA: Diagnosis present

## 2013-04-21 DIAGNOSIS — K769 Liver disease, unspecified: Secondary | ICD-10-CM

## 2013-04-21 DIAGNOSIS — R1310 Dysphagia, unspecified: Secondary | ICD-10-CM

## 2013-04-21 DIAGNOSIS — D62 Acute posthemorrhagic anemia: Secondary | ICD-10-CM

## 2013-04-21 DIAGNOSIS — K7460 Unspecified cirrhosis of liver: Secondary | ICD-10-CM

## 2013-04-21 DIAGNOSIS — R7989 Other specified abnormal findings of blood chemistry: Secondary | ICD-10-CM

## 2013-04-21 DIAGNOSIS — N179 Acute kidney failure, unspecified: Secondary | ICD-10-CM

## 2013-04-21 DIAGNOSIS — Z79899 Other long term (current) drug therapy: Secondary | ICD-10-CM

## 2013-04-21 DIAGNOSIS — I8510 Secondary esophageal varices without bleeding: Secondary | ICD-10-CM | POA: Diagnosis present

## 2013-04-21 DIAGNOSIS — D6959 Other secondary thrombocytopenia: Secondary | ICD-10-CM | POA: Diagnosis present

## 2013-04-21 DIAGNOSIS — K7581 Nonalcoholic steatohepatitis (NASH): Secondary | ICD-10-CM

## 2013-04-21 DIAGNOSIS — I10 Essential (primary) hypertension: Secondary | ICD-10-CM

## 2013-04-21 DIAGNOSIS — I8501 Esophageal varices with bleeding: Secondary | ICD-10-CM

## 2013-04-21 DIAGNOSIS — E119 Type 2 diabetes mellitus without complications: Secondary | ICD-10-CM

## 2013-04-21 LAB — CBC WITH DIFFERENTIAL/PLATELET
BASOS PCT: 0 % (ref 0–1)
Basophils Absolute: 0 10*3/uL (ref 0.0–0.1)
Eosinophils Absolute: 0.1 10*3/uL (ref 0.0–0.7)
Eosinophils Relative: 2 % (ref 0–5)
HCT: 25.5 % — ABNORMAL LOW (ref 36.0–46.0)
Hemoglobin: 8.4 g/dL — ABNORMAL LOW (ref 12.0–15.0)
LYMPHS ABS: 1.5 10*3/uL (ref 0.7–4.0)
Lymphocytes Relative: 25 % (ref 12–46)
MCH: 27.8 pg (ref 26.0–34.0)
MCHC: 32.9 g/dL (ref 30.0–36.0)
MCV: 84.4 fL (ref 78.0–100.0)
Monocytes Absolute: 0.4 10*3/uL (ref 0.1–1.0)
Monocytes Relative: 6 % (ref 3–12)
NEUTROS PCT: 66 % (ref 43–77)
Neutro Abs: 3.8 10*3/uL (ref 1.7–7.7)
PLATELETS: 144 10*3/uL — AB (ref 150–400)
RBC: 3.02 MIL/uL — AB (ref 3.87–5.11)
RDW: 17 % — ABNORMAL HIGH (ref 11.5–15.5)
WBC: 5.8 10*3/uL (ref 4.0–10.5)

## 2013-04-21 LAB — URINALYSIS, ROUTINE W REFLEX MICROSCOPIC
BILIRUBIN URINE: NEGATIVE
Glucose, UA: NEGATIVE mg/dL
HGB URINE DIPSTICK: NEGATIVE
Ketones, ur: NEGATIVE mg/dL
Nitrite: NEGATIVE
PH: 6 (ref 5.0–8.0)
Protein, ur: 30 mg/dL — AB
SPECIFIC GRAVITY, URINE: 1.019 (ref 1.005–1.030)
Urobilinogen, UA: 1 mg/dL (ref 0.0–1.0)

## 2013-04-21 LAB — COMPREHENSIVE METABOLIC PANEL
ALBUMIN: 3.1 g/dL — AB (ref 3.5–5.2)
ALK PHOS: 187 U/L — AB (ref 39–117)
ALT: 23 U/L (ref 0–35)
ALT: 25 U/L (ref 0–35)
AST: 33 U/L (ref 0–37)
AST: 38 U/L — ABNORMAL HIGH (ref 0–37)
Albumin: 3 g/dL — ABNORMAL LOW (ref 3.5–5.2)
Alkaline Phosphatase: 175 U/L — ABNORMAL HIGH (ref 39–117)
BILIRUBIN TOTAL: 0.2 mg/dL — AB (ref 0.3–1.2)
BUN: 27 mg/dL — ABNORMAL HIGH (ref 6–23)
BUN: 29 mg/dL — ABNORMAL HIGH (ref 6–23)
CHLORIDE: 105 meq/L (ref 96–112)
CO2: 20 mEq/L (ref 19–32)
CO2: 24 mEq/L (ref 19–32)
Calcium: 8.5 mg/dL (ref 8.4–10.5)
Calcium: 9.3 mg/dL (ref 8.4–10.5)
Chloride: 103 mEq/L (ref 96–112)
Creatinine, Ser: 1.17 mg/dL — ABNORMAL HIGH (ref 0.50–1.10)
Creatinine, Ser: 1.19 mg/dL — ABNORMAL HIGH (ref 0.50–1.10)
GFR calc Af Amer: 62 mL/min — ABNORMAL LOW (ref >90)
GFR calc non Af Amer: 53 mL/min — ABNORMAL LOW (ref >90)
GFR, EST AFRICAN AMERICAN: 63 mL/min — AB (ref >90)
GFR, EST NON AFRICAN AMERICAN: 55 mL/min — AB (ref >90)
GLUCOSE: 224 mg/dL — AB (ref 70–99)
Glucose, Bld: 141 mg/dL — ABNORMAL HIGH (ref 70–99)
POTASSIUM: 4.2 meq/L (ref 3.7–5.3)
Potassium: 4.3 mEq/L (ref 3.7–5.3)
SODIUM: 139 meq/L (ref 137–147)
SODIUM: 140 meq/L (ref 137–147)
TOTAL PROTEIN: 7.4 g/dL (ref 6.0–8.3)
Total Bilirubin: 0.2 mg/dL — ABNORMAL LOW (ref 0.3–1.2)
Total Protein: 7 g/dL (ref 6.0–8.3)

## 2013-04-21 LAB — URINE MICROSCOPIC-ADD ON

## 2013-04-21 LAB — CBC
HCT: 23.2 % — ABNORMAL LOW (ref 36.0–46.0)
HCT: 23.3 % — ABNORMAL LOW (ref 36.0–46.0)
HEMATOCRIT: 23.7 % — AB (ref 36.0–46.0)
Hemoglobin: 7.7 g/dL — ABNORMAL LOW (ref 12.0–15.0)
Hemoglobin: 7.7 g/dL — ABNORMAL LOW (ref 12.0–15.0)
Hemoglobin: 7.6 g/dL — ABNORMAL LOW (ref 12.0–15.0)
MCH: 28.1 pg (ref 26.0–34.0)
MCH: 27.6 pg (ref 26.0–34.0)
MCH: 27.6 pg (ref 26.0–34.0)
MCHC: 33.2 g/dL (ref 30.0–36.0)
MCHC: 32.5 g/dL (ref 30.0–36.0)
MCHC: 32.6 g/dL (ref 30.0–36.0)
MCV: 84.7 fL (ref 78.0–100.0)
MCV: 84.9 fL (ref 78.0–100.0)
MCV: 84.7 fL (ref 78.0–100.0)
PLATELETS: 124 10*3/uL — AB (ref 150–400)
PLATELETS: 117 10*3/uL — AB (ref 150–400)
Platelets: 112 10*3/uL — ABNORMAL LOW (ref 150–400)
RBC: 2.74 MIL/uL — AB (ref 3.87–5.11)
RBC: 2.79 MIL/uL — ABNORMAL LOW (ref 3.87–5.11)
RBC: 2.75 MIL/uL — ABNORMAL LOW (ref 3.87–5.11)
RDW: 17 % — AB (ref 11.5–15.5)
RDW: 16.9 % — AB (ref 11.5–15.5)
RDW: 17.1 % — ABNORMAL HIGH (ref 11.5–15.5)
WBC: 5.7 10*3/uL (ref 4.0–10.5)
WBC: 4.6 10*3/uL (ref 4.0–10.5)
WBC: 4.2 10*3/uL (ref 4.0–10.5)

## 2013-04-21 LAB — GLUCOSE, CAPILLARY
GLUCOSE-CAPILLARY: 189 mg/dL — AB (ref 70–99)
GLUCOSE-CAPILLARY: 122 mg/dL — AB (ref 70–99)
Glucose-Capillary: 205 mg/dL — ABNORMAL HIGH (ref 70–99)
Glucose-Capillary: 137 mg/dL — ABNORMAL HIGH (ref 70–99)
Glucose-Capillary: 162 mg/dL — ABNORMAL HIGH (ref 70–99)

## 2013-04-21 LAB — TROPONIN I: Troponin I: <0.3 ng/mL (ref <0.30)

## 2013-04-21 LAB — LIPASE, BLOOD: Lipase: 66 U/L — ABNORMAL HIGH (ref 11–59)

## 2013-04-21 LAB — PROTIME-INR
INR: 1.26 (ref 0.00–1.49)
PROTHROMBIN TIME: 15.5 s — AB (ref 11.6–15.2)

## 2013-04-21 LAB — POC OCCULT BLOOD, ED: Fecal Occult Bld: POSITIVE — AB

## 2013-04-21 LAB — LACTIC ACID, PLASMA: Lactic Acid, Venous: 1 mmol/L (ref 0.5–2.2)

## 2013-04-21 LAB — ABO/RH: ABO/RH(D): A POS

## 2013-04-21 LAB — MRSA PCR SCREENING: MRSA by PCR: NEGATIVE

## 2013-04-21 MED ORDER — SODIUM CHLORIDE 0.9 % IV SOLN: INTRAVENOUS | Status: DC

## 2013-04-21 MED ORDER — DEXTROSE 5 % IV SOLN
1.0000 g | Freq: Once | INTRAVENOUS | Status: AC
  Administered 2013-04-21: 1 g via INTRAVENOUS
  Filled 2013-04-21: qty 10

## 2013-04-21 MED ORDER — ONDANSETRON HCL 4 MG PO TABS: 4.0000 mg | ORAL_TABLET | Freq: Four times a day (QID) | ORAL | Status: DC | PRN

## 2013-04-21 MED ORDER — OCTREOTIDE LOAD VIA INFUSION
50.0000 ug | Freq: Once | INTRAVENOUS | Status: AC
  Administered 2013-04-21: 50 ug via INTRAVENOUS
  Filled 2013-04-21: qty 25

## 2013-04-21 MED ORDER — SODIUM CHLORIDE 0.9 % IV SOLN
80.0000 mg | Freq: Once | INTRAVENOUS | Status: AC
  Administered 2013-04-21: 80 mg via INTRAVENOUS
  Filled 2013-04-21: qty 80

## 2013-04-21 MED ORDER — ONDANSETRON HCL 4 MG/2ML IJ SOLN: 4.0000 mg | Freq: Four times a day (QID) | INTRAMUSCULAR | Status: DC | PRN

## 2013-04-21 MED ORDER — SODIUM CHLORIDE 0.9 % IV SOLN
INTRAVENOUS | Status: DC
  Administered 2013-04-21: 02:00:00 via INTRAVENOUS

## 2013-04-21 MED ORDER — ACETAMINOPHEN 325 MG PO TABS
650.0000 mg | ORAL_TABLET | Freq: Four times a day (QID) | ORAL | Status: DC | PRN
  Administered 2013-04-22: 650 mg via ORAL
  Filled 2013-04-21: qty 2

## 2013-04-21 MED ORDER — SODIUM CHLORIDE 0.9 % IV SOLN: INTRAVENOUS | Status: AC

## 2013-04-21 MED ORDER — PANTOPRAZOLE SODIUM 40 MG IV SOLR: 40.0000 mg | Freq: Two times a day (BID) | INTRAVENOUS | Status: DC

## 2013-04-21 MED ORDER — SODIUM CHLORIDE 0.9 % IV SOLN
8.0000 mg/h | INTRAVENOUS | Status: DC
  Administered 2013-04-21 – 2013-04-22 (×3): 8 mg/h via INTRAVENOUS
  Filled 2013-04-21 (×9): qty 80

## 2013-04-21 MED ORDER — SODIUM CHLORIDE 0.9 % IJ SOLN
3.0000 mL | Freq: Two times a day (BID) | INTRAMUSCULAR | Status: DC
  Administered 2013-04-22 – 2013-04-23 (×3): 3 mL via INTRAVENOUS

## 2013-04-21 MED ORDER — CEFTRIAXONE SODIUM 1 G IJ SOLR
1.0000 g | INTRAMUSCULAR | Status: DC
  Administered 2013-04-22: 1 g via INTRAVENOUS
  Filled 2013-04-21: qty 10

## 2013-04-21 MED ORDER — ACETAMINOPHEN 650 MG RE SUPP: 650.0000 mg | Freq: Four times a day (QID) | RECTAL | Status: DC | PRN

## 2013-04-21 MED ORDER — SODIUM CHLORIDE 0.9 % IV SOLN
25.0000 ug/h | INTRAVENOUS | Status: DC
  Administered 2013-04-21 (×2): 25 ug/h via INTRAVENOUS
  Filled 2013-04-21 (×8): qty 1

## 2013-04-21 MED ORDER — HYDRALAZINE HCL 20 MG/ML IJ SOLN: 10.0000 mg | INTRAMUSCULAR | Status: DC | PRN

## 2013-04-21 MED ORDER — INSULIN ASPART 100 UNIT/ML ~~LOC~~ SOLN
0.0000 [IU] | Freq: Three times a day (TID) | SUBCUTANEOUS | Status: DC
  Administered 2013-04-21: 3 [IU] via SUBCUTANEOUS
  Administered 2013-04-21: 1 [IU] via SUBCUTANEOUS
  Administered 2013-04-22 – 2013-04-23 (×2): 3 [IU] via SUBCUTANEOUS
  Administered 2013-04-23: 5 [IU] via SUBCUTANEOUS

## 2013-04-21 NOTE — Progress Notes (Signed)
[facility]  RAY GLACKEN IRC:[id number] DOB: Aug 07, 1965 DOA: 04/21/2013 PCP: No PCP Per Patient  Brief narrative: 48 year old female patient with known-. Last admitted December 2014 secondary to GI bleeding due to esophageal varices. Subsequently underwent esophageal banding. Presented to the ER this time because of dark melanotic stools. The patient had also been experiencing lower abdominal discomfort for 2 days.  Upon evaluation in the emergency department she was found to be mildly tachycardic with a hemoglobin around 8.4. According to the admitting physician's note the on-call gastroenterologist Dr. Henrene Pastor was consulted by Dr. Marnette Burgess the ER physician. The patient was started on octreotide and Protonix infusions.  Assessment/Plan: Active Problems:   Cirrhosis of liver 2/2 NASH/Thrombocytopenia -Liver functions and platelets at baseline -No ascites noted but given abdominal pain as a precaution empiric Rocephin initiated to cover for SBP    GI bleed -Gastroenterology consulted and anticipate will need upper GI endoscopy this admission -Continue octreotide and Protonix drip since has known esophageal varices    Acute blood loss anemia -Baseline hemoglobin around 9 -Currently has drifted down to 7.6 -Would only transfuse if hemoglobin less than 7.0 or if hypotensive and/or significant tachycardic     Azotemia/Acute renal failure -Multifactorial secondary to GI bleeding as well as dehydration -Continue IV fluids but decrease rate from 150 cc per hour to 100 cc per hour -Follow electrolytes -Hold metformin and Zestril from home    HTN (hypertension) -Holding home medications    DM (diabetes mellitus) -On metformin and glipizide prior to admission and these are currently on hold -Continue sliding scale insulin   Abnormal urinalysis -Nitrite negative but does have moderate leukocytes in 7-10 wbc's so could have underlying urinary tract  infection -Continue Rocephin as noted above -Followup urine culture   DVT prophylaxis: SCDs Code Status: Full Family Communication: Husband at bedside Disposition Plan/Expected LOS: Stepdown   Consultants: Gastroenterology  Procedures: None  Antibiotics: Rocephin  HPI/Subjective:  Agent alert and endorsing continued issues with mild lower abdominal pain. No nausea. No vomiting since presentation. No further stools.  Objective: Blood pressure 106/60, pulse 97, temperature 98.3 F (36.8 C), temperature source Oral, resp. rate 15, height 5\' 2"  (1.575 m), weight 201 lb 15.1 oz (91.6 kg), last menstrual period 04/03/2013, SpO2 98.00%.  Intake/Output Summary (Last 24 hours) at 04/21/13 1301 Last data filed at 04/21/13 1200  Gross per 24 hour  Intake 1553.75 ml  Output    925 ml  Net 628.75 ml     Exam: Patient admitted today at 3:09 AM. Follow up exam completed.  Scheduled Meds:  Scheduled Meds: . [START ON 04/22/2013] cefTRIAXone (ROCEPHIN)  IV  1 g Intravenous Q24H  . insulin aspart  0-9 Units Subcutaneous TID WC  . sodium chloride  3 mL Intravenous Q12H   Continuous Infusions: . sodium chloride 100 mL/hr at 04/21/13 0909  . octreotide (SANDOSTATIN) infusion 25 mcg/hr (04/21/13 1200)  . pantoprozole (PROTONIX) infusion 8 mg/hr (04/21/13 1200)    Data Reviewed: Basic Metabolic Panel:  Recent Labs Lab 04/21/13 0021 04/21/13 0420  NA 140 139  K 4.2 4.3  CL 103 105  CO2 24 20  GLUCOSE 141* 224*  BUN 29* 27*  CREATININE 1.19* 1.17*  CALCIUM 9.3 8.5   Liver Function Tests:  Recent Labs Lab 04/21/13 0021 04/21/13 0420  AST 38* 33  ALT 25 23  ALKPHOS 187* 175*  BILITOT 0.2* 0.2*  PROT 7.4 7.0  ALBUMIN 3.1* 3.0*  Recent Labs Lab 04/21/13 0021  LIPASE 66*   No results found for this basename: AMMONIA,  in the last 168 hours CBC:  Recent Labs Lab 04/21/13 0021 04/21/13 0420 04/21/13 0815 04/21/13 1124  WBC 5.8 5.7 4.6 4.2  NEUTROABS  3.8  --   --   --   HGB 8.4* 7.7* 7.7* 7.6*  HCT 25.5* 23.2* 23.7* 23.3*  MCV 84.4 84.7 84.9 84.7  PLT 144* 124* 112* 117*   Cardiac Enzymes:  Recent Labs Lab 04/21/13 0420  TROPONINI <0.30   BNP (last 3 results) No results found for this basename: PROBNP,  in the last 8760 hours CBG:  Recent Labs Lab 04/21/13 0300 04/21/13 0736 04/21/13 1224  GLUCAP 189* 205* 122*    Recent Results (from the past 240 hour(s))  MRSA PCR SCREENING     Status: None   Collection Time    04/21/13  3:51 AM      Result Value Ref Range Status   MRSA by PCR NEGATIVE  NEGATIVE Final   Comment:            The GeneXpert MRSA Assay (FDA     approved for NASAL specimens     only), is one component of a     comprehensive MRSA colonization     surveillance program. It is not     intended to diagnose MRSA     infection nor to guide or     monitor treatment for     MRSA infections.     Studies:  Recent x-ray studies have been reviewed in detail by the Attending Physician  Time spent :     Erin Hearing, [place] [facility] Office  [phone] Pager [phone]  **If unable to reach the above provider after paging please contact the [facility] @ [phone]  On-Call/Text Page:      [email or web address]      password [email or web address]  If 7PM-7AM, please contact night-coverage [email or web address] Password [email or web address] 04/21/2013, 1:01 PM   LOS: 0 days

## 2013-04-21 NOTE — ED Notes (Signed)
C/o abd pain, dark stool, nausea and HA. "a little dizzy".  Sx onset 2d ago. No meds PTA. (denies: fever, sob, vd).

## 2013-04-21 NOTE — Progress Notes (Signed)
ANTIBIOTIC CONSULT NOTE - INITIAL  Pharmacy Consult for Ceftriaxone Indication: +U/A, GIB  No Known Allergies  Patient Measurements: Height: 5\' 2"  (157.5 cm) Weight: 200 lb (90.719 kg) IBW/kg (Calculated) : 50.1  Vital Signs: Temp: 99.5 F (37.5 C) (03/23 0004) Temp src: Oral (03/23 0004) BP: 137/72 mmHg (03/23 0225) Pulse Rate: 115 (03/23 0130)  Labs:  Recent Labs  04/21/13 0021  WBC 5.8  HGB 8.4*  PLT 144*  CREATININE 1.19*   Estimated Creatinine Clearance: 61.2 ml/min (by C-G formula based on Cr of 1.19). No results found for this basename: VANCOTROUGH, VANCOPEAK, VANCORANDOM, GENTTROUGH, GENTPEAK, GENTRANDOM, TOBRATROUGH, TOBRAPEAK, TOBRARND, AMIKACINPEAK, AMIKACINTROU, AMIKACIN,  in the last 72 hours   Microbiology: No results found for this or any previous visit (from the past 720 hour(s)).  Medical History: Past Medical History  Diagnosis Date  . Diabetes   . Hypertension   . Esophageal varices with hemorrhage 01/30/2013  . Anemia     [facility] 02-04-13 acute blood loss  . GERD (gastroesophageal reflux disease)    Assessment: 48 y/o F here with GIB (h/o of varices), also with +U/A, WBC 5.8, other labs as above. Urine culture ordered.   Plan:  -Ceftriaxone 1g IV q24h -Trend WBC, temp -F/U urine culture results  Narda Bonds 04/21/2013,3:31 AM

## 2013-04-21 NOTE — H&P (Addendum)
[facility] History and Physical  Selena Bennett JQB:[id number] DOB: Jul 19, 1965 DOA: 04/21/2013  Referring physician: ER physician. PCP: No PCP Per Patient   Chief Complaint: Black stools.  HPI: Selena Bennett is a 48 y.o. female with history of cirrhosis of liver who was admitted last December for GI bleed from esophageal varices and had banding twice since then presents the ER because of black stools. Patient states that a day before patient had some dark stools and last evening patient had 2 bowel movements which were black. Patient also has been having abdominal discomfort since last 2 days. Denies any nausea vomiting fever chills dizziness loss of consciousness or shortness of breath. Patient at times had neck pressure. In the ER patient was found to be mildly tachycardic and hemoglobin is around 8.4. On call gastroenterologist Dr. Henrene Pastor was consulted by Dr. Marnette Burgess ER physician. At this time patient has been started on octreotide and Protonix infusion and admitted for further management.   Review of Systems: As presented in the history of presenting illness, rest negative.  Past Medical History  Diagnosis Date  . Diabetes   . Hypertension   . Esophageal varices with hemorrhage 01/30/2013  . Anemia     [facility] 02-04-13 acute blood loss  . GERD (gastroesophageal reflux disease)    Past Surgical History  Procedure Laterality Date  . Cholecystectomy open    . Esophagogastroduodenoscopy N/A 01/28/2013    Procedure: ESOPHAGOGASTRODUODENOSCOPY (EGD);  Surgeon: Beryle Beams, MD;  Location: [facility] ENDOSCOPY;  Service: Endoscopy;  Laterality: N/A;  bedside  . Esophagogastroduodenoscopy N/A 02/21/2013    Procedure: ESOPHAGOGASTRODUODENOSCOPY (EGD);  Surgeon: Beryle Beams, MD;  Location: [facility] ENDOSCOPY;  Service: Endoscopy;  Laterality: N/A;  . Esophageal banding N/A 02/21/2013    Procedure: ESOPHAGEAL BANDING;  Surgeon: Beryle Beams, MD;  Location: [facility] ENDOSCOPY;  Service: Endoscopy;   Laterality: N/A;  . C section x1      1989  . Esophagogastroduodenoscopy N/A 03/21/2013    Procedure: ESOPHAGOGASTRODUODENOSCOPY (EGD);  Surgeon: Beryle Beams, MD;  Location: [facility] ENDOSCOPY;  Service: Endoscopy;  Laterality: N/A;  . Esophageal banding N/A 03/21/2013    Procedure: ESOPHAGEAL BANDING;  Surgeon: Beryle Beams, MD;  Location: [facility] ENDOSCOPY;  Service: Endoscopy;  Laterality: N/A;   Social History:  reports that she has never smoked. She has never used smokeless tobacco. She reports that she does not drink alcohol or use illicit drugs. Where does patient live home. Can patient participate in ADLs? Yes.  No Known Allergies  Family History: History reviewed. No pertinent family history.    Prior to Admission medications   Medication Sig Start Date End Date Taking? Authorizing Provider  amLODipine (NORVASC) 10 MG tablet Take 1 tablet (10 mg total) by mouth daily. 02/05/13  Yes Nishant Dhungel, MD  glipiZIDE (GLUCOTROL) 10 MG tablet Take 10 mg by mouth daily before breakfast.   Yes Historical Provider, MD  lisinopril (PRINIVIL,ZESTRIL) 20 MG tablet Take 1 tablet (20 mg total) by mouth daily. 02/05/13  Yes Nishant Dhungel, MD  metFORMIN (GLUMETZA) 500 MG (MOD) 24 hr tablet Take 500 mg by mouth 2 (two) times daily with a meal.   Yes Historical Provider, MD  oxyCODONE-acetaminophen (ROXICET) 5-325 MG per tablet Take 1 tablet by mouth every 6 (six) hours as needed for severe pain. 02/05/13  Yes Nishant Dhungel, MD  pantoprazole (PROTONIX) 40 MG tablet Take 1 tablet (40 mg total) by mouth daily. 02/05/13  Yes Louellen Molder, MD  Physical Exam: Filed Vitals:   04/21/13 0113 04/21/13 0130 04/21/13 0225 04/21/13 0239  BP: 132/81 131/81 137/72   Pulse:  115    Temp:      TempSrc:      Resp: 17 19 18 18   Height:      Weight:      SpO2: 100% 100% 99% 99%     General:  Well-developed well-nourished.  Eyes: Anicteric no pallor.  ENT: No discharge from ears eyes nose mouth.  Neck: No  mass felt.  Cardiovascular: S1-S2 heard. Tachycardic.  Respiratory: No rhonchi or crepitations.  Abdomen: Soft nontender bowel sounds present. No guarding rigidity.  Skin: No rash.  Musculoskeletal: No edema.  Psychiatric: Appears normal.  Neurologic: Alert awake oriented to time place and person. Moves all extremities.  Labs on Admission:  Basic Metabolic Panel:  Recent Labs Lab 04/21/13 0021  NA 140  K 4.2  CL 103  CO2 24  GLUCOSE 141*  BUN 29*  CREATININE 1.19*  CALCIUM 9.3   Liver Function Tests:  Recent Labs Lab 04/21/13 0021  AST 38*  ALT 25  ALKPHOS 187*  BILITOT 0.2*  PROT 7.4  ALBUMIN 3.1*    Recent Labs Lab 04/21/13 0021  LIPASE 66*   No results found for this basename: AMMONIA,  in the last 168 hours CBC:  Recent Labs Lab 04/21/13 0021  WBC 5.8  NEUTROABS 3.8  HGB 8.4*  HCT 25.5*  MCV 84.4  PLT 144*   Cardiac Enzymes: No results found for this basename: CKTOTAL, CKMB, CKMBINDEX, TROPONINI,  in the last 168 hours  BNP (last 3 results) No results found for this basename: PROBNP,  in the last 8760 hours CBG:  Recent Labs Lab 04/21/13 0300  GLUCAP 189*    Radiological Exams on Admission: Dg Chest Portable 1 View  04/21/2013   CLINICAL DATA:  Abdominal pain.  Nausea.  Headache.  EXAM: PORTABLE CHEST - 1 VIEW  COMPARISON:  No priors.  FINDINGS: Lung volumes are normal. No consolidative airspace disease. No pleural effusions. No pneumothorax. No pulmonary nodule or mass noted. Pulmonary vasculature and the cardiomediastinal silhouette are within normal limits.  IMPRESSION: 1.  No radiographic evidence of acute cardiopulmonary disease.   Electronically Signed   By: Vinnie Langton M.D.   On: 04/21/2013 01:00     Assessment/Plan Principal Problem:   GI bleed Active Problems:   HTN (hypertension)   DM (diabetes mellitus)   Cirrhosis of liver without mention of alcohol   Thrombocytopenia, unspecified   1. GI bleed with  history of esophageal varices and cirrhosis of liver - continue with octreotide and Protonix infusion. Continue with ceftriaxone antibiotics empirically started. I have ordered CBC now and every 4 hourly. Type and screen. Check INR. In anticipation of procedure patient has been kept n.p.o. On-call gastroenterologist was consulted by ER physician. Patient's cirrhosis is thought to be secondary to NASH. Closely monitor in step down. 2. Abdominal discomfort with elevated lipase and LFTs - patient's lipase and AST ALT and alkaline phosphatase is mildly elevated. Bilirubin is normal. For now patient is n.p.o. May consider further imaging of the abdomen once patient is stable. Further recommendations per GI. 3. Anemia - from blood loss. Closely follow CBC. 4. History of diabetes mellitus - patient is n.p.o. and is placed on sliding-scale coverage. 5. Hypertension - when necessary IV hydralazine for systolic blood pressure more than 160. 6. Thrombocytopenia - probably from cirrhosis. Closely follow CBC.  I have reviewed patient's old  charts and labs.  Code Status: Full code.  Family Communication: Patient's husband.  Disposition Plan: Admit to inpatient.    Tyauna Lacaze N. [facility] Pager [phone].  If 7PM-7AM, please contact night-coverage [email or web address] Password [facility] 04/21/2013, 3:09 AM

## 2013-04-21 NOTE — Progress Notes (Signed)
Utilization review completed.  

## 2013-04-21 NOTE — Progress Notes (Signed)
Patient seen, examined and discussed with the nurse practitioner. Agree with her note. Doing a little bit better. Abdominal pain lessened. Monitoring hemoglobin, currently staying at 7.6. Transfuse if below 7. Seen by gastroenterology with plans for endoscopy tomorrow 3/24.

## 2013-04-21 NOTE — ED Provider Notes (Signed)
CSN: [id number]     Arrival date & time 04/21/13  0000 History   First MD Initiated Contact with Patient 04/21/13 0019     Chief Complaint  Patient presents with  . Abdominal Pain  . Nausea  . Headache  . Melena     (Consider location/radiation/quality/duration/timing/severity/associated sxs/prior Treatment) HPI HX per PT - epigastric and mid ABD gnawing pain x 2 days 7/10 in severity and radiates to her back. No N/V/D, today noticed black tarry stools.  She is followed by GI for esophageal varices s/p banding 2 months ago. At that time was vomiting blood. She c/o dizziness no syncope symptoms MOD in severity.   Past Medical History  Diagnosis Date  . Diabetes   . Hypertension   . Esophageal varices with hemorrhage 01/30/2013  . Anemia     [facility] 02-04-13 acute blood loss  . GERD (gastroesophageal reflux disease)    Past Surgical History  Procedure Laterality Date  . Cholecystectomy open    . Esophagogastroduodenoscopy N/A 01/28/2013    Procedure: ESOPHAGOGASTRODUODENOSCOPY (EGD);  Surgeon: Beryle Beams, MD;  Location: [facility] ENDOSCOPY;  Service: Endoscopy;  Laterality: N/A;  bedside  . Esophagogastroduodenoscopy N/A 02/21/2013    Procedure: ESOPHAGOGASTRODUODENOSCOPY (EGD);  Surgeon: Beryle Beams, MD;  Location: [facility] ENDOSCOPY;  Service: Endoscopy;  Laterality: N/A;  . Esophageal banding N/A 02/21/2013    Procedure: ESOPHAGEAL BANDING;  Surgeon: Beryle Beams, MD;  Location: [facility] ENDOSCOPY;  Service: Endoscopy;  Laterality: N/A;  . C section x1      1989  . Esophagogastroduodenoscopy N/A 03/21/2013    Procedure: ESOPHAGOGASTRODUODENOSCOPY (EGD);  Surgeon: Beryle Beams, MD;  Location: [facility] ENDOSCOPY;  Service: Endoscopy;  Laterality: N/A;  . Esophageal banding N/A 03/21/2013    Procedure: ESOPHAGEAL BANDING;  Surgeon: Beryle Beams, MD;  Location: [facility] ENDOSCOPY;  Service: Endoscopy;  Laterality: N/A;   No family history on file. History  Substance Use Topics  . Smoking status:  Never Smoker   . Smokeless tobacco: Never Used  . Alcohol Use: No   OB History   Grav Para Term Preterm Abortions TAB SAB Ect Mult Living                 Review of Systems  Constitutional: Negative for diaphoresis and fatigue.  Eyes: Negative for visual disturbance.  Respiratory: Negative for shortness of breath.   Cardiovascular: Negative for chest pain.  Gastrointestinal: Positive for abdominal pain and blood in stool.  Genitourinary: Negative for dysuria.  Musculoskeletal: Negative for neck pain.  Skin: Negative for rash.  Neurological: Positive for dizziness. Negative for syncope.  All other systems reviewed and are negative.      Allergies  Review of patient's allergies indicates no known allergies.  Home Medications   Current Outpatient Rx  Name  Route  Sig  Dispense  Refill  . amLODipine (NORVASC) 10 MG tablet   Oral   Take 1 tablet (10 mg total) by mouth daily.   30 tablet   0   . glipiZIDE (GLUCOTROL) 10 MG tablet   Oral   Take 10 mg by mouth daily before breakfast.         . lisinopril (PRINIVIL,ZESTRIL) 20 MG tablet   Oral   Take 1 tablet (20 mg total) by mouth daily.   30 tablet   0   . metFORMIN (GLUMETZA) 500 MG (MOD) 24 hr tablet   Oral   Take 500 mg by mouth 2 (two) times daily with a  meal.         . oxyCODONE-acetaminophen (ROXICET) 5-325 MG per tablet   Oral   Take 1 tablet by mouth every 6 (six) hours as needed for severe pain.   30 tablet   0   . pantoprazole (PROTONIX) 40 MG tablet   Oral   Take 1 tablet (40 mg total) by mouth daily.   30 tablet   0    BP 142/83  Pulse 111  Temp(Src) 99.5 F (37.5 C) (Oral)  Resp 14  Ht 5\' 2"  (1.575 m)  Wt 200 lb (90.719 kg)  BMI 36.57 kg/m2  SpO2 100%  LMP 04/03/2013 Physical Exam  Constitutional: She is oriented to person, place, and time. She appears well-developed and well-nourished.  HENT:  Head: Normocephalic and atraumatic.  Mouth/Throat: Oropharynx is clear and moist.   Eyes: EOM are normal. Pupils are equal, round, and reactive to light.  Neck: Neck supple.  Cardiovascular: Regular rhythm and intact distal pulses.   tachycardic  Pulmonary/Chest: Effort normal and breath sounds normal. No respiratory distress. She exhibits no tenderness.  Abdominal: Soft. Bowel sounds are normal. She exhibits no distension.  Epigastric TTP  Genitourinary:  Rectal exam: melena guaiac POS  Musculoskeletal: Normal range of motion. She exhibits no edema and no tenderness.  Neurological: She is alert and oriented to person, place, and time. No cranial nerve deficit.  Skin: Skin is warm and dry.    ED Course  Procedures (including critical care time) Labs Review Labs Reviewed  CBC WITH DIFFERENTIAL - Abnormal; Notable for the following:    RBC 3.02 (*)    Hemoglobin 8.4 (*)    HCT 25.5 (*)    RDW 17.0 (*)    Platelets 144 (*)    All other components within normal limits  COMPREHENSIVE METABOLIC PANEL - Abnormal; Notable for the following:    Glucose, Bld 141 (*)    BUN 29 (*)    Creatinine, Ser 1.19 (*)    Albumin 3.1 (*)    AST 38 (*)    Alkaline Phosphatase 187 (*)    Total Bilirubin 0.2 (*)    GFR calc non Af Amer 53 (*)    GFR calc Af Amer 62 (*)    All other components within normal limits  LIPASE, BLOOD - Abnormal; Notable for the following:    Lipase 66 (*)    All other components within normal limits  POC OCCULT BLOOD, ED - Abnormal; Notable for the following:    Fecal Occult Bld POSITIVE (*)    All other components within normal limits  URINALYSIS, ROUTINE W REFLEX MICROSCOPIC  POC URINE PREG, ED  TYPE AND SCREEN   Imaging Review Dg Chest Portable 1 View  04/21/2013   CLINICAL DATA:  Abdominal pain.  Nausea.  Headache.  EXAM: PORTABLE CHEST - 1 VIEW  COMPARISON:  No priors.  FINDINGS: Lung volumes are normal. No consolidative airspace disease. No pleural effusions. No pneumothorax. No pulmonary nodule or mass noted. Pulmonary vasculature and the  cardiomediastinal silhouette are within normal limits.  IMPRESSION: 1.  No radiographic evidence of acute cardiopulmonary disease.   Electronically Signed   By: Vinnie Langton M.D.   On: 04/21/2013 01:00     EKG Interpretation   Date/Time:  Monday April 21 2013 01:07:35 EDT Ventricular Rate:  111 PR Interval:  142 QRS Duration: 74 QT Interval:  352 QTC Calculation: 478 R Axis:   64 Text Interpretation:  Sinus tachycardia Nonspecific ST abnormality  Abnormal ECG Confirmed by Kyro Joswick  MD, Annelies Coyt ([id number]) on 04/21/2013 1:48:42 AM     CRITICAL CARE Performed by: Teressa Lower Total critical care time: 30 Critical care time was exclusive of separately billable procedures and treating other patients. Critical care was necessary to treat or prevent imminent or life-threatening deterioration. Critical care was time spent personally by me on the following activities: development of treatment plan with patient and/or surrogate as well as nursing, discussions with consultants, evaluation of patient's response to treatment, examination of patient, obtaining history from patient or surrogate, ordering and performing treatments and interventions, ordering and review of laboratory studies, ordering and review of radiographic studies, pulse oximetry and re-evaluation of patient's condition.PPI bolus and drip, IVFs  1:21 AM d/w GI, will start octreotide drip, give rocephin and Dr Benson Norway will see PT in the am.   1:50 AM d/w Dr Hal Hope, plan admit SDU MDM   Dx: GI Bleed  H/o esophageal varices, presents ABD pain, dizzy, with melena, tachycardia T/S, protonix drip, octreotide drip, IVFs Gi consult MED admit    Teressa Lower, MD 04/21/13 [phone]

## 2013-04-21 NOTE — Consult Note (Signed)
Reason for Consult: GI bleed Referring Physician: [facility]  Selena Bennett HPI: This is a 48 year old female who is well-known to me for a history of NASH cirrhosis and esophageal variceal bleed.  Two days prior to admission she was complaining of intense epigastric abdominal pain.  This was followed by melena.  As a result of the melena she presented to the ER.  Her HGB was noted to drift down to 7.7 g/dL.  No evidence of hematemesis.  Since starting on IV Protonix her pain has resolved and she feels much better.  Her last EGD was performed on 03/21/2013 with small esophageal varices.  She responded very well to the banding and no further bands were placed last time.  Past Medical History  Diagnosis Date  . Diabetes   . Hypertension   . Esophageal varices with hemorrhage 01/30/2013  . Anemia     [facility] 02-04-13 acute blood loss  . GERD (gastroesophageal reflux disease)     Past Surgical History  Procedure Laterality Date  . Cholecystectomy open    . Esophagogastroduodenoscopy N/A 01/28/2013    Procedure: ESOPHAGOGASTRODUODENOSCOPY (EGD);  Surgeon: Beryle Beams, MD;  Location: [facility] ENDOSCOPY;  Service: Endoscopy;  Laterality: N/A;  bedside  . Esophagogastroduodenoscopy N/A 02/21/2013    Procedure: ESOPHAGOGASTRODUODENOSCOPY (EGD);  Surgeon: Beryle Beams, MD;  Location: [facility] ENDOSCOPY;  Service: Endoscopy;  Laterality: N/A;  . Esophageal banding N/A 02/21/2013    Procedure: ESOPHAGEAL BANDING;  Surgeon: Beryle Beams, MD;  Location: [facility] ENDOSCOPY;  Service: Endoscopy;  Laterality: N/A;  . C section x1      1989  . Esophagogastroduodenoscopy N/A 03/21/2013    Procedure: ESOPHAGOGASTRODUODENOSCOPY (EGD);  Surgeon: Beryle Beams, MD;  Location: [facility] ENDOSCOPY;  Service: Endoscopy;  Laterality: N/A;  . Esophageal banding N/A 03/21/2013    Procedure: ESOPHAGEAL BANDING;  Surgeon: Beryle Beams, MD;  Location: [facility] ENDOSCOPY;  Service: Endoscopy;  Laterality: N/A;    History reviewed. No  pertinent family history.  Social History:  reports that she has never smoked. She has never used smokeless tobacco. She reports that she does not drink alcohol or use illicit drugs.  Allergies: No Known Allergies  Medications:  Scheduled: . [START ON 04/22/2013] cefTRIAXone (ROCEPHIN)  IV  1 g Intravenous Q24H  . insulin aspart  0-9 Units Subcutaneous TID WC  . sodium chloride  3 mL Intravenous Q12H   Continuous: . sodium chloride 100 mL/hr at 04/21/13 0909  . octreotide (SANDOSTATIN) infusion 25 mcg/hr (04/21/13 1200)  . pantoprozole (PROTONIX) infusion 8 mg/hr (04/21/13 1315)    Results for orders placed during the hospital encounter of 04/21/13 (from the past 24 hour(s))  CBC WITH DIFFERENTIAL     Status: Abnormal   Collection Time    04/21/13 12:21 AM      Result Value Ref Range   WBC 5.8  4.0 - 10.5 K/uL   RBC 3.02 (*) 3.87 - 5.11 MIL/uL   Hemoglobin 8.4 (*) 12.0 - 15.0 g/dL   HCT 25.5 (*) 36.0 - 46.0 %   MCV 84.4  78.0 - 100.0 fL   MCH 27.8  26.0 - 34.0 pg   MCHC 32.9  30.0 - 36.0 g/dL   RDW 17.0 (*) 11.5 - 15.5 %   Platelets 144 (*) 150 - 400 K/uL   Neutrophils Relative % 66  43 - 77 %   Neutro Abs 3.8  1.7 - 7.7 K/uL   Lymphocytes Relative 25  12 -  46 %   Lymphs Abs 1.5  0.7 - 4.0 K/uL   Monocytes Relative 6  3 - 12 %   Monocytes Absolute 0.4  0.1 - 1.0 K/uL   Eosinophils Relative 2  0 - 5 %   Eosinophils Absolute 0.1  0.0 - 0.7 K/uL   Basophils Relative 0  0 - 1 %   Basophils Absolute 0.0  0.0 - 0.1 K/uL  COMPREHENSIVE METABOLIC PANEL     Status: Abnormal   Collection Time    04/21/13 12:21 AM      Result Value Ref Range   Sodium 140  137 - 147 mEq/L   Potassium 4.2  3.7 - 5.3 mEq/L   Chloride 103  96 - 112 mEq/L   CO2 24  19 - 32 mEq/L   Glucose, Bld 141 (*) 70 - 99 mg/dL   BUN 29 (*) 6 - 23 mg/dL   Creatinine, Ser 1.19 (*) 0.50 - 1.10 mg/dL   Calcium 9.3  8.4 - 10.5 mg/dL   Total Protein 7.4  6.0 - 8.3 g/dL   Albumin 3.1 (*) 3.5 - 5.2 g/dL   AST 38  (*) 0 - 37 U/L   ALT 25  0 - 35 U/L   Alkaline Phosphatase 187 (*) 39 - 117 U/L   Total Bilirubin 0.2 (*) 0.3 - 1.2 mg/dL   GFR calc non Af Amer 53 (*) >90 mL/min   GFR calc Af Amer 62 (*) >90 mL/min  LIPASE, BLOOD     Status: Abnormal   Collection Time    04/21/13 12:21 AM      Result Value Ref Range   Lipase 66 (*) 11 - 59 U/L  POC OCCULT BLOOD, ED     Status: Abnormal   Collection Time    04/21/13 12:36 AM      Result Value Ref Range   Fecal Occult Bld POSITIVE (*) NEGATIVE  TYPE AND SCREEN     Status: None   Collection Time    04/21/13 12:48 AM      Result Value Ref Range   ABO/RH(D) A POS     Antibody Screen NEG     Sample Expiration 04/24/2013    ABO/RH     Status: None   Collection Time    04/21/13 12:48 AM      Result Value Ref Range   ABO/RH(D) A POS    URINALYSIS, ROUTINE W REFLEX MICROSCOPIC     Status: Abnormal   Collection Time    04/21/13  1:14 AM      Result Value Ref Range   Color, Urine YELLOW  YELLOW   APPearance CLOUDY (*) CLEAR   Specific Gravity, Urine 1.019  1.005 - 1.030   pH 6.0  5.0 - 8.0   Glucose, UA NEGATIVE  NEGATIVE mg/dL   Hgb urine dipstick NEGATIVE  NEGATIVE   Bilirubin Urine NEGATIVE  NEGATIVE   Ketones, ur NEGATIVE  NEGATIVE mg/dL   Protein, ur 30 (*) NEGATIVE mg/dL   Urobilinogen, UA 1.0  0.0 - 1.0 mg/dL   Nitrite NEGATIVE  NEGATIVE   Leukocytes, UA MODERATE (*) NEGATIVE  URINE MICROSCOPIC-ADD ON     Status: Abnormal   Collection Time    04/21/13  1:14 AM      Result Value Ref Range   Squamous Epithelial / LPF MANY (*) RARE   WBC, UA 7-10  <3 WBC/hpf   Bacteria, UA MANY (*) RARE  GLUCOSE, CAPILLARY     Status:  Abnormal   Collection Time    04/21/13  3:00 AM      Result Value Ref Range   Glucose-Capillary 189 (*) 70 - 99 mg/dL   Comment 1 Notify RN    MRSA PCR SCREENING     Status: None   Collection Time    04/21/13  3:51 AM      Result Value Ref Range   MRSA by PCR NEGATIVE  NEGATIVE  CBC     Status: Abnormal    Collection Time    04/21/13  4:20 AM      Result Value Ref Range   WBC 5.7  4.0 - 10.5 K/uL   RBC 2.74 (*) 3.87 - 5.11 MIL/uL   Hemoglobin 7.7 (*) 12.0 - 15.0 g/dL   HCT 23.2 (*) 36.0 - 46.0 %   MCV 84.7  78.0 - 100.0 fL   MCH 28.1  26.0 - 34.0 pg   MCHC 33.2  30.0 - 36.0 g/dL   RDW 17.0 (*) 11.5 - 15.5 %   Platelets 124 (*) 150 - 400 K/uL  COMPREHENSIVE METABOLIC PANEL     Status: Abnormal   Collection Time    04/21/13  4:20 AM      Result Value Ref Range   Sodium 139  137 - 147 mEq/L   Potassium 4.3  3.7 - 5.3 mEq/L   Chloride 105  96 - 112 mEq/L   CO2 20  19 - 32 mEq/L   Glucose, Bld 224 (*) 70 - 99 mg/dL   BUN 27 (*) 6 - 23 mg/dL   Creatinine, Ser 1.17 (*) 0.50 - 1.10 mg/dL   Calcium 8.5  8.4 - 10.5 mg/dL   Total Protein 7.0  6.0 - 8.3 g/dL   Albumin 3.0 (*) 3.5 - 5.2 g/dL   AST 33  0 - 37 U/L   ALT 23  0 - 35 U/L   Alkaline Phosphatase 175 (*) 39 - 117 U/L   Total Bilirubin 0.2 (*) 0.3 - 1.2 mg/dL   GFR calc non Af Amer 55 (*) >90 mL/min   GFR calc Af Amer 63 (*) >90 mL/min  LACTIC ACID, PLASMA     Status: None   Collection Time    04/21/13  4:20 AM      Result Value Ref Range   Lactic Acid, Venous 1.0  0.5 - 2.2 mmol/L  TROPONIN I     Status: None   Collection Time    04/21/13  4:20 AM      Result Value Ref Range   Troponin I <0.30  <0.30 ng/mL  GLUCOSE, CAPILLARY     Status: Abnormal   Collection Time    04/21/13  7:36 AM      Result Value Ref Range   Glucose-Capillary 205 (*) 70 - 99 mg/dL  CBC     Status: Abnormal   Collection Time    04/21/13  8:15 AM      Result Value Ref Range   WBC 4.6  4.0 - 10.5 K/uL   RBC 2.79 (*) 3.87 - 5.11 MIL/uL   Hemoglobin 7.7 (*) 12.0 - 15.0 g/dL   HCT 23.7 (*) 36.0 - 46.0 %   MCV 84.9  78.0 - 100.0 fL   MCH 27.6  26.0 - 34.0 pg   MCHC 32.5  30.0 - 36.0 g/dL   RDW 16.9 (*) 11.5 - 15.5 %   Platelets 112 (*) 150 - 400 K/uL  PROTIME-INR     Status:  Abnormal   Collection Time    04/21/13  8:15 AM      Result Value Ref  Range   Prothrombin Time 15.5 (*) 11.6 - 15.2 seconds   INR 1.26  0.00 - 1.49  CBC     Status: Abnormal   Collection Time    04/21/13 11:24 AM      Result Value Ref Range   WBC 4.2  4.0 - 10.5 K/uL   RBC 2.75 (*) 3.87 - 5.11 MIL/uL   Hemoglobin 7.6 (*) 12.0 - 15.0 g/dL   HCT 23.3 (*) 36.0 - 46.0 %   MCV 84.7  78.0 - 100.0 fL   MCH 27.6  26.0 - 34.0 pg   MCHC 32.6  30.0 - 36.0 g/dL   RDW 17.1 (*) 11.5 - 15.5 %   Platelets 117 (*) 150 - 400 K/uL  GLUCOSE, CAPILLARY     Status: Abnormal   Collection Time    04/21/13 12:24 PM      Result Value Ref Range   Glucose-Capillary 122 (*) 70 - 99 mg/dL     Dg Chest Portable 1 View  04/21/2013   CLINICAL DATA:  Abdominal pain.  Nausea.  Headache.  EXAM: PORTABLE CHEST - 1 VIEW  COMPARISON:  No priors.  FINDINGS: Lung volumes are normal. No consolidative airspace disease. No pleural effusions. No pneumothorax. No pulmonary nodule or mass noted. Pulmonary vasculature and the cardiomediastinal silhouette are within normal limits.  IMPRESSION: 1.  No radiographic evidence of acute cardiopulmonary disease.   Electronically Signed   By: Vinnie Langton M.D.   On: 04/21/2013 01:00    ROS:  As stated above in the HPI otherwise negative.  Blood pressure 106/60, pulse 97, temperature 98.3 F (36.8 C), temperature source Oral, resp. rate 15, height 5\' 2"  (1.575 m), weight 201 lb 15.1 oz (91.6 kg), last menstrual period 04/03/2013, SpO2 98.00%.    PE: Gen: NAD, Alert and Oriented HEENT:  [place]/AT, EOMI Neck: Supple, no LAD Lungs: CTA Bilaterally CV: RRR without M/G/R ABM: Soft, NTND, +BS Ext: No C/C/E  Assessment/Plan: 1) GI bleed. 2) NASH cirrhosis. 3) History of esophageal variceal bleed.   I am suspecting that her bleed was peptic in nature, however, I cannot know for sure until I perform an EGD.  I will have her undergo the procedure tomorrow.  Currently she is hemodynamically stable.  Plan: 1) EGD tomorrow. 2) Continue with Protonix and  Octreotide.  Benedetto Ryder D 04/21/2013, 1:30 PM

## 2013-04-22 ENCOUNTER — Encounter (HOSPITAL_COMMUNITY): Payer: MEDICAID | Admitting: Certified Registered Nurse Anesthetist

## 2013-04-22 ENCOUNTER — Encounter (HOSPITAL_COMMUNITY)
Admission: EM | Disposition: A | Discharge status: Home / Self Care | Payer: Self-pay | Source: Home / Self Care | Attending: Internal Medicine

## 2013-04-22 ENCOUNTER — Inpatient Hospital Stay (HOSPITAL_COMMUNITY): Payer: Self-pay | Admitting: Certified Registered Nurse Anesthetist

## 2013-04-22 ENCOUNTER — Encounter (HOSPITAL_COMMUNITY): Payer: Self-pay | Admitting: Certified Registered Nurse Anesthetist

## 2013-04-22 DIAGNOSIS — I10 Essential (primary) hypertension: Secondary | ICD-10-CM

## 2013-04-22 HISTORY — PX: ESOPHAGOGASTRODUODENOSCOPY: SHX5428

## 2013-04-22 LAB — CBC
HCT: 24.5 % — ABNORMAL LOW (ref 36.0–46.0)
HEMOGLOBIN: 8.1 g/dL — AB (ref 12.0–15.0)
MCH: 28.5 pg (ref 26.0–34.0)
MCHC: 33.1 g/dL (ref 30.0–36.0)
MCV: 86.3 fL (ref 78.0–100.0)
Platelets: 112 10*3/uL — ABNORMAL LOW (ref 150–400)
RBC: 2.84 MIL/uL — ABNORMAL LOW (ref 3.87–5.11)
RDW: 16.9 % — ABNORMAL HIGH (ref 11.5–15.5)
WBC: 3.3 10*3/uL — ABNORMAL LOW (ref 4.0–10.5)

## 2013-04-22 LAB — BASIC METABOLIC PANEL
BUN: 17 mg/dL (ref 6–23)
CHLORIDE: 108 meq/L (ref 96–112)
CO2: 21 mEq/L (ref 19–32)
Calcium: 8.2 mg/dL — ABNORMAL LOW (ref 8.4–10.5)
Creatinine, Ser: 1.11 mg/dL — ABNORMAL HIGH (ref 0.50–1.10)
GFR calc Af Amer: 67 mL/min — ABNORMAL LOW (ref >90)
GFR calc non Af Amer: 58 mL/min — ABNORMAL LOW (ref >90)
Glucose, Bld: 147 mg/dL — ABNORMAL HIGH (ref 70–99)
POTASSIUM: 3.9 meq/L (ref 3.7–5.3)
Sodium: 141 mEq/L (ref 137–147)

## 2013-04-22 LAB — GLUCOSE, CAPILLARY
GLUCOSE-CAPILLARY: 143 mg/dL — AB (ref 70–99)
Glucose-Capillary: 144 mg/dL — ABNORMAL HIGH (ref 70–99)
Glucose-Capillary: 163 mg/dL — ABNORMAL HIGH (ref 70–99)
Glucose-Capillary: 217 mg/dL — ABNORMAL HIGH (ref 70–99)

## 2013-04-22 LAB — URINE CULTURE
COLONY COUNT: NO GROWTH
Culture: NO GROWTH

## 2013-04-22 SURGERY — EGD (ESOPHAGOGASTRODUODENOSCOPY)
Anesthesia: Monitor Anesthesia Care

## 2013-04-22 MED ORDER — LACTATED RINGERS IV SOLN
INTRAVENOUS | Status: DC | PRN
  Administered 2013-04-22: 13:00:00 via INTRAVENOUS

## 2013-04-22 MED ORDER — PROPOFOL INFUSION 10 MG/ML OPTIME
INTRAVENOUS | Status: DC | PRN
  Administered 2013-04-22 (×2): 100 ug/kg/min via INTRAVENOUS

## 2013-04-22 MED ORDER — MIDAZOLAM HCL 5 MG/5ML IJ SOLN
INTRAMUSCULAR | Status: DC | PRN
  Administered 2013-04-22: 1 mg via INTRAVENOUS

## 2013-04-22 MED ORDER — PANTOPRAZOLE SODIUM 40 MG PO TBEC
40.0000 mg | DELAYED_RELEASE_TABLET | Freq: Every day | ORAL | Status: DC
  Administered 2013-04-22 – 2013-04-23 (×2): 40 mg via ORAL
  Filled 2013-04-22 (×2): qty 1

## 2013-04-22 NOTE — Anesthesia Preprocedure Evaluation (Signed)
Anesthesia Evaluation  Patient identified by MRN, date of birth, ID band Patient awake    Reviewed: Allergy & Precautions, H&P , NPO status , Patient's Chart, lab work & pertinent test results  Airway Mallampati: I TM Distance: >3 FB Neck ROM: Full    Dental   Pulmonary          Cardiovascular hypertension, Pt. on medications     Neuro/Psych    GI/Hepatic GERD-  Medicated and Controlled,  Endo/Other  diabetes, Type 2, Oral Hypoglycemic Agents  Renal/GU      Musculoskeletal   Abdominal   Peds  Hematology   Anesthesia Other Findings   Reproductive/Obstetrics                           Anesthesia Physical Anesthesia Plan  ASA: III  Anesthesia Plan: MAC   Post-op Pain Management:    Induction: Intravenous  Airway Management Planned: Natural Airway  Additional Equipment:   Intra-op Plan:   Post-operative Plan:   Informed Consent: I have reviewed the patients History and Physical, chart, labs and discussed the procedure including the risks, benefits and alternatives for the proposed anesthesia with the patient or authorized representative who has indicated his/her understanding and acceptance.     Plan Discussed with: CRNA and Surgeon  Anesthesia Plan Comments:         Anesthesia Quick Evaluation

## 2013-04-22 NOTE — Anesthesia Postprocedure Evaluation (Signed)
Anesthesia Post Note  Patient: Selena Bennett  Procedure(s) Performed: Procedure(s) (LRB): ESOPHAGOGASTRODUODENOSCOPY (EGD) (N/A)  Anesthesia type: general  Patient location: PACU  Post pain: Pain level controlled  Post assessment: Patient's Cardiovascular Status Stable  Last Vitals:  Filed Vitals:   04/22/13 1320  BP: 144/65  Pulse: 81  Temp:   Resp: 18    Post vital signs: Reviewed and stable  Level of consciousness: sedated  Complications: No apparent anesthesia complications 

## 2013-04-22 NOTE — Anesthesia Postprocedure Evaluation (Signed)
Anesthesia Post Note  Patient: Selena Bennett  Procedure(s) Performed: Procedure(s) (LRB): ESOPHAGOGASTRODUODENOSCOPY (EGD) (N/A)  Anesthesia type: general  Patient location: PACU  Post pain: Pain level controlled  Post assessment: Patient's Cardiovascular Status Stable  Last Vitals:  Filed Vitals:   04/22/13 1320  BP: 144/65  Pulse: 81  Temp:   Resp: 18    Post vital signs: Reviewed and stable  Level of consciousness: sedated  Complications: No apparent anesthesia complications

## 2013-04-22 NOTE — Progress Notes (Signed)
ANTIBIOTIC CONSULT NOTE - FOLLOW UP  Pharmacy Consult for Ceftriaxone Indication: r/o UTI No Known Allergies Patient Measurements: Height: 5\' 2"  (157.5 cm) Weight: 203 lb 1.6 oz (92.126 kg) IBW/kg (Calculated) : 50.1 Vital Signs: Temp: 97.6 F (36.4 C) (03/24 1303) Temp src: Oral (03/24 1303) BP: 144/65 mmHg (03/24 1320) Pulse Rate: 81 (03/24 1320) Intake/Output from previous day: 03/23 0701 - 03/24 0700 In: 2121.3 [I.V.:2071.3; IV Piggyback:50] Out: 625 [Urine:625] Labs:  Recent Labs  04/21/13 0021 04/21/13 0420 04/21/13 0815 04/21/13 1124 04/22/13 0925  WBC 5.8 5.7 4.6 4.2 3.3*  HGB 8.4* 7.7* 7.7* 7.6* 8.1*  PLT 144* 124* 112* 117* 112*  CREATININE 1.19* 1.17*  --   --  1.11*   Estimated Creatinine Clearance: 66.2 ml/min (by C-G formula based on Cr of 1.11). Microbiology: Recent Results (from the past 720 hour(s))  MRSA PCR SCREENING     Status: None   Collection Time    04/21/13  3:51 AM      Result Value Ref Range Status   MRSA by PCR NEGATIVE  NEGATIVE Final   Comment:            The GeneXpert MRSA Assay (FDA     approved for NASAL specimens     only), is one component of a     comprehensive MRSA colonization     surveillance program. It is not     intended to diagnose MRSA     infection nor to guide or     monitor treatment for     MRSA infections.  URINE CULTURE     Status: None   Collection Time    04/21/13  6:39 AM      Result Value Ref Range Status   Specimen Description URINE, CLEAN CATCH   Final   Special Requests NONE   Final   Culture  Setup Time     Final   Value: 04/21/2013 12:49     Performed at [facility]     Final   Value: NO GROWTH     Performed at [facility]   Culture     Final   Value: NO GROWTH     Performed at [facility]   Report Status 04/22/2013 FINAL   Final    Anti-infectives   Start     Dose/Rate Route Frequency Ordered Stop   04/22/13 0200  cefTRIAXone (ROCEPHIN) 1 g in  dextrose 5 % 50 mL IVPB     1 g 100 mL/hr over 30 Minutes Intravenous Every 24 hours 04/21/13 0334     04/21/13 0130  cefTRIAXone (ROCEPHIN) 1 g in dextrose 5 % 50 mL IVPB     1 g 100 mL/hr over 30 Minutes Intravenous  Once 04/21/13 0121 04/21/13 0220      Assessment: 7 YOF on Rocephin for r/o UTI. Urine culture is pending. MRSA PCR is negative. Patient also has GIB s/p EGD with esophageal varices and fundic varices. If patient is at risk for potential SBP consider adding anaerobic coverage. Rocephin dose is appropriate and does not need to be adjusted for renal dysfunction. WBC is low. Patient is afebrile.   Goal of Therapy:  Clinical resolution of infection  Plan:  Continue Rocephin 1g IV q24h. Pharmacy will sign off as this medication does not need adjustment.  Consider adding a stop date when cultures result.   Sloan Leiter, PharmD, BCPS Clinical Pharmacist [phone] 04/22/2013,1:34 PM

## 2013-04-22 NOTE — Progress Notes (Signed)
Patient seen, examined and discussed with my nurse practitioner. Seen prior to endoscopy. Agree with her note. Post endoscopy, esophageal varices noted, likely decompressed after recent bleeding episode. Stable. Restarted on food. Will transfer to floor and check hemoglobin in the morning. If stable, can likely be discharged home. Blood pressures have remained stable.

## 2013-04-22 NOTE — Interval H&P Note (Signed)
History and Physical Interval Note:  04/22/2013 12:30 PM  Selena Bennett  has presented today for surgery, with the diagnosis of GI bleed.  History of esophageal varices.  The various methods of treatment have been discussed with the patient and family. After consideration of risks, benefits and other options for treatment, the patient has consented to  Procedure(s): ESOPHAGOGASTRODUODENOSCOPY (EGD) (N/A) as a surgical intervention .  The patient's history has been reviewed, patient examined, no change in status, stable for surgery.  I have reviewed the patient's chart and labs.  Questions were answered to the patient's satisfaction.     Shaney Deckman D

## 2013-04-22 NOTE — Op Note (Signed)
[facility] [place], [address]   OPERATIVE PROCEDURE REPORT  PATIENT: Selena Bennett, Selena Bennett.  MR#: [id number] BIRTHDATE: April 05, 1965  GENDER: Female ENDOSCOPIST: Carol Ada, MD ASSISTANT:   Gunnar Fusi, RN and Cristopher Estimable, technician PROCEDURE DATE: 04/22/2013 PROCEDURE:   EGD ASA CLASS:   Class III INDICATIONS: Melena and Anemia MEDICATIONS: MAC sedation, administered by CRNA TOPICAL ANESTHETIC:   none  DESCRIPTION OF PROCEDURE:   After the risks benefits and alternatives of the procedure were thoroughly explained, informed consent was obtained.  The Pentax Gastroscope [id number]  endoscope was introduced through the mouth  and advanced to the second portion of the duodenum Without limitations.      The instrument was slowly withdrawn as the mucosa was fully examined.    FINDINGS: In the mid to distal esophagus there was evidence of small esophageal varices and scarring from the prior bandings.  No suspicious sites of bleeding from this region.  IN the gastric fundus retained gastric contents were noted making clear visualization of the fundus difficult, however, there was no evidence of any bleeding sites in the gastric lumen, i.e., ulcerations, erosions, or AVMs.  The fundic varices were again identified and no bleeding was noted.  The duodenum was normal. Retroflexed views revealed no abnormalities.     The scope was then withdrawn from the patient and the procedure terminated.  COMPLICATIONS: There were no complications.  IMPRESSION: 1) Esophageal varices - Small and nonbleeding. 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  The recent bleeding may have decompressed the esophageal and fundic varices giving the impression that these were not be bleeding sites.  If bleeding recurs, I have a suspicion that the fundic varices could be the source.  RECOMMENDATIONS: 1) Continue with Protonix as this helped to resolve her  epigastric pain. 2) Follow up in the office in 2 weeks.  _______________________________ eSignedCarol Ada, MD 04/22/2013 1:03 PM    PATIENT NAME:  Selena Bennett, Selena Bennett. MR#: [id number]

## 2013-04-22 NOTE — Preoperative (Signed)
Beta Blockers   Reason not to administer Beta Blockers:Not Applicable 

## 2013-04-22 NOTE — Transfer of Care (Signed)
Immediate Anesthesia Transfer of Care Note  Patient: Selena Bennett  Procedure(s) Performed: Procedure(s): ESOPHAGOGASTRODUODENOSCOPY (EGD) (N/A)  Patient Location: PACU  Anesthesia Type:MAC  Level of Consciousness: awake, alert  and oriented  Airway & Oxygen Therapy: Patient Spontanous Breathing  Post-op Assessment: Report given to PACU RN  Post vital signs: Reviewed and stable  Complications: No apparent anesthesia complications

## 2013-04-22 NOTE — H&P (View-Only) (Signed)
Reason for Consult: GI bleed Referring Physician: [facility]  Alvia Grove HPI: This is a 48 year old female who is well-known to me for a history of NASH cirrhosis and esophageal variceal bleed.  Two days prior to admission she was complaining of intense epigastric abdominal pain.  This was followed by melena.  As a result of the melena she presented to the ER.  Her HGB was noted to drift down to 7.7 g/dL.  No evidence of hematemesis.  Since starting on IV Protonix her pain has resolved and she feels much better.  Her last EGD was performed on 03/21/2013 with small esophageal varices.  She responded very well to the banding and no further bands were placed last time.  Past Medical History  Diagnosis Date  . Diabetes   . Hypertension   . Esophageal varices with hemorrhage 01/30/2013  . Anemia     [facility] 02-04-13 acute blood loss  . GERD (gastroesophageal reflux disease)     Past Surgical History  Procedure Laterality Date  . Cholecystectomy open    . Esophagogastroduodenoscopy N/A 01/28/2013    Procedure: ESOPHAGOGASTRODUODENOSCOPY (EGD);  Surgeon: Beryle Beams, MD;  Location: [facility] ENDOSCOPY;  Service: Endoscopy;  Laterality: N/A;  bedside  . Esophagogastroduodenoscopy N/A 02/21/2013    Procedure: ESOPHAGOGASTRODUODENOSCOPY (EGD);  Surgeon: Beryle Beams, MD;  Location: [facility] ENDOSCOPY;  Service: Endoscopy;  Laterality: N/A;  . Esophageal banding N/A 02/21/2013    Procedure: ESOPHAGEAL BANDING;  Surgeon: Beryle Beams, MD;  Location: [facility] ENDOSCOPY;  Service: Endoscopy;  Laterality: N/A;  . C section x1      1989  . Esophagogastroduodenoscopy N/A 03/21/2013    Procedure: ESOPHAGOGASTRODUODENOSCOPY (EGD);  Surgeon: Beryle Beams, MD;  Location: [facility] ENDOSCOPY;  Service: Endoscopy;  Laterality: N/A;  . Esophageal banding N/A 03/21/2013    Procedure: ESOPHAGEAL BANDING;  Surgeon: Beryle Beams, MD;  Location: [facility] ENDOSCOPY;  Service: Endoscopy;  Laterality: N/A;    History reviewed. No  pertinent family history.  Social History:  reports that she has never smoked. She has never used smokeless tobacco. She reports that she does not drink alcohol or use illicit drugs.  Allergies: No Known Allergies  Medications:  Scheduled: . [START ON 04/22/2013] cefTRIAXone (ROCEPHIN)  IV  1 g Intravenous Q24H  . insulin aspart  0-9 Units Subcutaneous TID WC  . sodium chloride  3 mL Intravenous Q12H   Continuous: . sodium chloride 100 mL/hr at 04/21/13 0909  . octreotide (SANDOSTATIN) infusion 25 mcg/hr (04/21/13 1200)  . pantoprozole (PROTONIX) infusion 8 mg/hr (04/21/13 1315)    Results for orders placed during the hospital encounter of 04/21/13 (from the past 24 hour(s))  CBC WITH DIFFERENTIAL     Status: Abnormal   Collection Time    04/21/13 12:21 AM      Result Value Ref Range   WBC 5.8  4.0 - 10.5 K/uL   RBC 3.02 (*) 3.87 - 5.11 MIL/uL   Hemoglobin 8.4 (*) 12.0 - 15.0 g/dL   HCT 25.5 (*) 36.0 - 46.0 %   MCV 84.4  78.0 - 100.0 fL   MCH 27.8  26.0 - 34.0 pg   MCHC 32.9  30.0 - 36.0 g/dL   RDW 17.0 (*) 11.5 - 15.5 %   Platelets 144 (*) 150 - 400 K/uL   Neutrophils Relative % 66  43 - 77 %   Neutro Abs 3.8  1.7 - 7.7 K/uL   Lymphocytes Relative 25  12 -  46 %   Lymphs Abs 1.5  0.7 - 4.0 K/uL   Monocytes Relative 6  3 - 12 %   Monocytes Absolute 0.4  0.1 - 1.0 K/uL   Eosinophils Relative 2  0 - 5 %   Eosinophils Absolute 0.1  0.0 - 0.7 K/uL   Basophils Relative 0  0 - 1 %   Basophils Absolute 0.0  0.0 - 0.1 K/uL  COMPREHENSIVE METABOLIC PANEL     Status: Abnormal   Collection Time    04/21/13 12:21 AM      Result Value Ref Range   Sodium 140  137 - 147 mEq/L   Potassium 4.2  3.7 - 5.3 mEq/L   Chloride 103  96 - 112 mEq/L   CO2 24  19 - 32 mEq/L   Glucose, Bld 141 (*) 70 - 99 mg/dL   BUN 29 (*) 6 - 23 mg/dL   Creatinine, Ser 1.19 (*) 0.50 - 1.10 mg/dL   Calcium 9.3  8.4 - 10.5 mg/dL   Total Protein 7.4  6.0 - 8.3 g/dL   Albumin 3.1 (*) 3.5 - 5.2 g/dL   AST 38  (*) 0 - 37 U/L   ALT 25  0 - 35 U/L   Alkaline Phosphatase 187 (*) 39 - 117 U/L   Total Bilirubin 0.2 (*) 0.3 - 1.2 mg/dL   GFR calc non Af Amer 53 (*) >90 mL/min   GFR calc Af Amer 62 (*) >90 mL/min  LIPASE, BLOOD     Status: Abnormal   Collection Time    04/21/13 12:21 AM      Result Value Ref Range   Lipase 66 (*) 11 - 59 U/L  POC OCCULT BLOOD, ED     Status: Abnormal   Collection Time    04/21/13 12:36 AM      Result Value Ref Range   Fecal Occult Bld POSITIVE (*) NEGATIVE  TYPE AND SCREEN     Status: None   Collection Time    04/21/13 12:48 AM      Result Value Ref Range   ABO/RH(D) A POS     Antibody Screen NEG     Sample Expiration 04/24/2013    ABO/RH     Status: None   Collection Time    04/21/13 12:48 AM      Result Value Ref Range   ABO/RH(D) A POS    URINALYSIS, ROUTINE W REFLEX MICROSCOPIC     Status: Abnormal   Collection Time    04/21/13  1:14 AM      Result Value Ref Range   Color, Urine YELLOW  YELLOW   APPearance CLOUDY (*) CLEAR   Specific Gravity, Urine 1.019  1.005 - 1.030   pH 6.0  5.0 - 8.0   Glucose, UA NEGATIVE  NEGATIVE mg/dL   Hgb urine dipstick NEGATIVE  NEGATIVE   Bilirubin Urine NEGATIVE  NEGATIVE   Ketones, ur NEGATIVE  NEGATIVE mg/dL   Protein, ur 30 (*) NEGATIVE mg/dL   Urobilinogen, UA 1.0  0.0 - 1.0 mg/dL   Nitrite NEGATIVE  NEGATIVE   Leukocytes, UA MODERATE (*) NEGATIVE  URINE MICROSCOPIC-ADD ON     Status: Abnormal   Collection Time    04/21/13  1:14 AM      Result Value Ref Range   Squamous Epithelial / LPF MANY (*) RARE   WBC, UA 7-10  <3 WBC/hpf   Bacteria, UA MANY (*) RARE  GLUCOSE, CAPILLARY     Status:  Abnormal   Collection Time    04/21/13  3:00 AM      Result Value Ref Range   Glucose-Capillary 189 (*) 70 - 99 mg/dL   Comment 1 Notify RN    MRSA PCR SCREENING     Status: None   Collection Time    04/21/13  3:51 AM      Result Value Ref Range   MRSA by PCR NEGATIVE  NEGATIVE  CBC     Status: Abnormal    Collection Time    04/21/13  4:20 AM      Result Value Ref Range   WBC 5.7  4.0 - 10.5 K/uL   RBC 2.74 (*) 3.87 - 5.11 MIL/uL   Hemoglobin 7.7 (*) 12.0 - 15.0 g/dL   HCT 23.2 (*) 36.0 - 46.0 %   MCV 84.7  78.0 - 100.0 fL   MCH 28.1  26.0 - 34.0 pg   MCHC 33.2  30.0 - 36.0 g/dL   RDW 17.0 (*) 11.5 - 15.5 %   Platelets 124 (*) 150 - 400 K/uL  COMPREHENSIVE METABOLIC PANEL     Status: Abnormal   Collection Time    04/21/13  4:20 AM      Result Value Ref Range   Sodium 139  137 - 147 mEq/L   Potassium 4.3  3.7 - 5.3 mEq/L   Chloride 105  96 - 112 mEq/L   CO2 20  19 - 32 mEq/L   Glucose, Bld 224 (*) 70 - 99 mg/dL   BUN 27 (*) 6 - 23 mg/dL   Creatinine, Ser 1.17 (*) 0.50 - 1.10 mg/dL   Calcium 8.5  8.4 - 10.5 mg/dL   Total Protein 7.0  6.0 - 8.3 g/dL   Albumin 3.0 (*) 3.5 - 5.2 g/dL   AST 33  0 - 37 U/L   ALT 23  0 - 35 U/L   Alkaline Phosphatase 175 (*) 39 - 117 U/L   Total Bilirubin 0.2 (*) 0.3 - 1.2 mg/dL   GFR calc non Af Amer 55 (*) >90 mL/min   GFR calc Af Amer 63 (*) >90 mL/min  LACTIC ACID, PLASMA     Status: None   Collection Time    04/21/13  4:20 AM      Result Value Ref Range   Lactic Acid, Venous 1.0  0.5 - 2.2 mmol/L  TROPONIN I     Status: None   Collection Time    04/21/13  4:20 AM      Result Value Ref Range   Troponin I <0.30  <0.30 ng/mL  GLUCOSE, CAPILLARY     Status: Abnormal   Collection Time    04/21/13  7:36 AM      Result Value Ref Range   Glucose-Capillary 205 (*) 70 - 99 mg/dL  CBC     Status: Abnormal   Collection Time    04/21/13  8:15 AM      Result Value Ref Range   WBC 4.6  4.0 - 10.5 K/uL   RBC 2.79 (*) 3.87 - 5.11 MIL/uL   Hemoglobin 7.7 (*) 12.0 - 15.0 g/dL   HCT 23.7 (*) 36.0 - 46.0 %   MCV 84.9  78.0 - 100.0 fL   MCH 27.6  26.0 - 34.0 pg   MCHC 32.5  30.0 - 36.0 g/dL   RDW 16.9 (*) 11.5 - 15.5 %   Platelets 112 (*) 150 - 400 K/uL  PROTIME-INR     Status:  Abnormal   Collection Time    04/21/13  8:15 AM      Result Value Ref  Range   Prothrombin Time 15.5 (*) 11.6 - 15.2 seconds   INR 1.26  0.00 - 1.49  CBC     Status: Abnormal   Collection Time    04/21/13 11:24 AM      Result Value Ref Range   WBC 4.2  4.0 - 10.5 K/uL   RBC 2.75 (*) 3.87 - 5.11 MIL/uL   Hemoglobin 7.6 (*) 12.0 - 15.0 g/dL   HCT 23.3 (*) 36.0 - 46.0 %   MCV 84.7  78.0 - 100.0 fL   MCH 27.6  26.0 - 34.0 pg   MCHC 32.6  30.0 - 36.0 g/dL   RDW 17.1 (*) 11.5 - 15.5 %   Platelets 117 (*) 150 - 400 K/uL  GLUCOSE, CAPILLARY     Status: Abnormal   Collection Time    04/21/13 12:24 PM      Result Value Ref Range   Glucose-Capillary 122 (*) 70 - 99 mg/dL     Dg Chest Portable 1 View  04/21/2013   CLINICAL DATA:  Abdominal pain.  Nausea.  Headache.  EXAM: PORTABLE CHEST - 1 VIEW  COMPARISON:  No priors.  FINDINGS: Lung volumes are normal. No consolidative airspace disease. No pleural effusions. No pneumothorax. No pulmonary nodule or mass noted. Pulmonary vasculature and the cardiomediastinal silhouette are within normal limits.  IMPRESSION: 1.  No radiographic evidence of acute cardiopulmonary disease.   Electronically Signed   By: Vinnie Langton M.D.   On: 04/21/2013 01:00    ROS:  As stated above in the HPI otherwise negative.  Blood pressure 106/60, pulse 97, temperature 98.3 F (36.8 C), temperature source Oral, resp. rate 15, height 5\' 2"  (1.575 m), weight 201 lb 15.1 oz (91.6 kg), last menstrual period 04/03/2013, SpO2 98.00%.    PE: Gen: NAD, Alert and Oriented HEENT:  [place]/AT, EOMI Neck: Supple, no LAD Lungs: CTA Bilaterally CV: RRR without M/G/R ABM: Soft, NTND, +BS Ext: No C/C/E  Assessment/Plan: 1) GI bleed. 2) NASH cirrhosis. 3) History of esophageal variceal bleed.   I am suspecting that her bleed was peptic in nature, however, I cannot know for sure until I perform an EGD.  I will have her undergo the procedure tomorrow.  Currently she is hemodynamically stable.  Plan: 1) EGD tomorrow. 2) Continue with Protonix and  Octreotide.  Karsen Fellows D 04/21/2013, 1:30 PM

## 2013-04-22 NOTE — Progress Notes (Signed)
[facility]  Selena Bennett KZS:[id number] DOB: 06/25/1965 DOA: 04/21/2013 PCP: No PCP Per Patient  Brief narrative: 48 year old female patient with known-. Last admitted December 2014 secondary to GI bleeding due to esophageal varices. Subsequently underwent esophageal banding. Presented to the ER this time because of dark melanotic stools. The patient had also been experiencing lower abdominal discomfort for 2 days.  Upon evaluation in the emergency department she was found to be mildly tachycardic with a hemoglobin around 8.4. According to the admitting physician's note the on-call gastroenterologist Dr. Henrene Pastor was consulted by Dr. Marnette Burgess the ER physician. The patient was started on octreotide and Protonix infusions.  Assessment/Plan: Active Problems:   Cirrhosis of liver 2/2 NASH/Thrombocytopenia -Liver functions and platelets at baseline -No ascites noted but given abdominal pain was started on empiric Rocephin to cover for SBP but subsequent exam not c/w this so will dc Rocephin -pain was epigastric and appears to have decreased since admit and after initiation of PPI    GI bleed -Gastroenterology consulted  -EGD today: 1) Esophageal varices - Small and nonbleeding. 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 -If bleeding recurs GI suspects fundic varices would be likely source -cont octreotide and Protonix   -follow up in office in 2 weeks    Acute blood loss anemia -Baseline hemoglobin around 9 -Currently had drifted down to 7.6 but now back up to 8.1 -Would only transfuse if hemoglobin less than 7.0 or if hypotensive and/or significant tachycardic     Azotemia/Acute renal failure -Multifactorial secondary to GI bleeding as well as dehydration -post EGD on diet so KVO fluids -Follow electrolytes-near baseline -Hold metformin and Zestril     HTN (hypertension) -Holding home medications    DM (diabetes mellitus) -On metformin and glipizide  prior to admission and these are currently on hold -Continue sliding scale insulin   Abnormal urinalysis -Nitrite negative but does have moderate leukocytes in 7-10 wbc's so could have underlying urinary tract infection but urine culture no growth -dc Rocephin   DVT prophylaxis: SCDs Code Status: Full Family Communication: Husband at bedside Disposition Plan/Expected LOS: Stepdown   Consultants: Gastroenterology  Procedures: None  Antibiotics: Rocephin  HPI/Subjective: Alert and without complaints. No further melena  Objective: Blood pressure 144/65, pulse 81, temperature 97.6 F (36.4 C), temperature source Oral, resp. rate 18, height 5\' 2"  (1.575 m), weight 203 lb 1.6 oz (92.126 kg), last menstrual period 04/03/2013, SpO2 100.00%.  Intake/Output Summary (Last 24 hours) at 04/22/13 1412 Last data filed at 04/22/13 1255  Gross per 24 hour  Intake   1480 ml  Output    250 ml  Net   1230 ml     Exam: Patient admitted today at 3:09 AM. Follow up exam completed.  Scheduled Meds:  Scheduled Meds: . cefTRIAXone (ROCEPHIN)  IV  1 g Intravenous Q24H  . insulin aspart  0-9 Units Subcutaneous TID WC  . sodium chloride  3 mL Intravenous Q12H   Continuous Infusions: . sodium chloride    . octreotide (SANDOSTATIN) infusion 25 mcg/hr (04/22/13 0600)  . pantoprozole (PROTONIX) infusion 8 mg/hr (04/22/13 0600)    Data Reviewed: Basic Metabolic Panel:  Recent Labs Lab 04/21/13 0021 04/21/13 0420 04/22/13 0925  NA 140 139 141  K 4.2 4.3 3.9  CL 103 105 108  CO2 24 20 21   GLUCOSE 141* 224* 147*  BUN 29* 27* 17  CREATININE 1.19* 1.17* 1.11*  CALCIUM 9.3 8.5 8.2*  Liver Function Tests:  Recent Labs Lab 04/21/13 0021 04/21/13 0420  AST 38* 33  ALT 25 23  ALKPHOS 187* 175*  BILITOT 0.2* 0.2*  PROT 7.4 7.0  ALBUMIN 3.1* 3.0*    Recent Labs Lab 04/21/13 0021  LIPASE 66*   No results found for this basename: AMMONIA,  in the last 168  hours CBC:  Recent Labs Lab 04/21/13 0021 04/21/13 0420 04/21/13 0815 04/21/13 1124 04/22/13 0925  WBC 5.8 5.7 4.6 4.2 3.3*  NEUTROABS 3.8  --   --   --   --   HGB 8.4* 7.7* 7.7* 7.6* 8.1*  HCT 25.5* 23.2* 23.7* 23.3* 24.5*  MCV 84.4 84.7 84.9 84.7 86.3  PLT 144* 124* 112* 117* 112*   Cardiac Enzymes:  Recent Labs Lab 04/21/13 0420  TROPONINI <0.30   BNP (last 3 results) No results found for this basename: PROBNP,  in the last 8760 hours CBG:  Recent Labs Lab 04/21/13 1556 04/21/13 2026 04/22/13 0010 04/22/13 0350 04/22/13 0739  GLUCAP 137* 162* 163* 144* 143*    Recent Results (from the past 240 hour(s))  MRSA PCR SCREENING     Status: None   Collection Time    04/21/13  3:51 AM      Result Value Ref Range Status   MRSA by PCR NEGATIVE  NEGATIVE Final   Comment:            The GeneXpert MRSA Assay (FDA     approved for NASAL specimens     only), is one component of a     comprehensive MRSA colonization     surveillance program. It is not     intended to diagnose MRSA     infection nor to guide or     monitor treatment for     MRSA infections.  URINE CULTURE     Status: None   Collection Time    04/21/13  6:39 AM      Result Value Ref Range Status   Specimen Description URINE, CLEAN CATCH   Final   Special Requests NONE   Final   Culture  Setup Time     Final   Value: 04/21/2013 12:49     Performed at [place]     Final   Value: NO GROWTH     Performed at [facility]   Culture     Final   Value: NO GROWTH     Performed at [facility]   Report Status 04/22/2013 FINAL   Final     Studies:  Recent x-ray studies have been reviewed in detail by the Attending Physician  Time spent :     Erin Hearing, [place] [facility] Office  [phone] Pager [phone]  **If unable to reach the above provider after paging please contact the [place] @ [phone]  On-Call/Text Page:       [email or web address]      password [email or web address]  If 7PM-7AM, please contact night-coverage [email or web address] Password [email or web address] 04/22/2013, 2:12 PM   LOS: 1 day

## 2013-04-22 NOTE — Progress Notes (Signed)
Pt tx [facility] per MD order, pt VSS, pt verbalized understanding of tx, all questions answered, report called to charge RN

## 2013-04-23 ENCOUNTER — Encounter (HOSPITAL_COMMUNITY): Payer: Self-pay | Admitting: Gastroenterology

## 2013-04-23 DIAGNOSIS — R7989 Other specified abnormal findings of blood chemistry: Secondary | ICD-10-CM

## 2013-04-23 LAB — CBC
HCT: 22 % — ABNORMAL LOW (ref 36.0–46.0)
HCT: 29.2 % — ABNORMAL LOW (ref 36.0–46.0)
Hemoglobin: 7.1 g/dL — ABNORMAL LOW (ref 12.0–15.0)
Hemoglobin: 9.9 g/dL — ABNORMAL LOW (ref 12.0–15.0)
MCH: 27.5 pg (ref 26.0–34.0)
MCH: 28.2 pg (ref 26.0–34.0)
MCHC: 32.3 g/dL (ref 30.0–36.0)
MCHC: 33.9 g/dL (ref 30.0–36.0)
MCV: 85.3 fL (ref 78.0–100.0)
MCV: 83.2 fL (ref 78.0–100.0)
Platelets: 128 10*3/uL — ABNORMAL LOW (ref 150–400)
Platelets: 133 10*3/uL — ABNORMAL LOW (ref 150–400)
RBC: 2.58 MIL/uL — ABNORMAL LOW (ref 3.87–5.11)
RBC: 3.51 MIL/uL — ABNORMAL LOW (ref 3.87–5.11)
RDW: 16.8 % — ABNORMAL HIGH (ref 11.5–15.5)
RDW: 16.1 % — ABNORMAL HIGH (ref 11.5–15.5)
WBC: 3.3 10*3/uL — ABNORMAL LOW (ref 4.0–10.5)
WBC: 3.4 10*3/uL — ABNORMAL LOW (ref 4.0–10.5)

## 2013-04-23 LAB — BASIC METABOLIC PANEL
BUN: 21 mg/dL (ref 6–23)
CO2: 20 mEq/L (ref 19–32)
Calcium: 8.5 mg/dL (ref 8.4–10.5)
Chloride: 105 mEq/L (ref 96–112)
Creatinine, Ser: 1.3 mg/dL — ABNORMAL HIGH (ref 0.50–1.10)
GFR calc Af Amer: 56 mL/min — ABNORMAL LOW (ref >90)
GFR, EST NON AFRICAN AMERICAN: 48 mL/min — AB (ref >90)
Glucose, Bld: 308 mg/dL — ABNORMAL HIGH (ref 70–99)
Potassium: 4.1 mEq/L (ref 3.7–5.3)
Sodium: 139 mEq/L (ref 137–147)

## 2013-04-23 LAB — GLUCOSE, CAPILLARY
GLUCOSE-CAPILLARY: 267 mg/dL — AB (ref 70–99)
Glucose-Capillary: 229 mg/dL — ABNORMAL HIGH (ref 70–99)
Glucose-Capillary: 290 mg/dL — ABNORMAL HIGH (ref 70–99)
Glucose-Capillary: 224 mg/dL — ABNORMAL HIGH (ref 70–99)

## 2013-04-23 LAB — PREPARE RBC (CROSSMATCH)

## 2013-04-23 MED ORDER — DIPHENHYDRAMINE HCL 50 MG/ML IJ SOLN
25.0000 mg | Freq: Once | INTRAMUSCULAR | Status: AC
  Administered 2013-04-23: 25 mg via INTRAVENOUS
  Filled 2013-04-23: qty 1

## 2013-04-23 NOTE — Discharge Summary (Signed)
Physician Discharge Summary  Selena Bennett KVQ:[id number] DOB: December 16, 1965 DOA: 04/21/2013  PCP: No PCP Per Patient  Admit date: 04/21/2013 Discharge date: 04/23/2013  Time spent: 35 * minutes  Recommendations for Outpatient Follow-up:  * Discharge Diagnoses:  Active Problems:   HTN (hypertension)   DM (diabetes mellitus)   Liver cirrhosis secondary to NASH (nonalcoholic steatohepatitis)   Acute blood loss anemia   Thrombocytopenia, unspecified   GI bleed   Azotemia   Acute renal failure   Discharge Condition: *Stable  Diet recommendation: Low salt, diabetic diet  Filed Weights   04/21/13 0255 04/22/13 0348 04/22/13 1947  Weight: 91.6 kg (201 lb 15.1 oz) 92.126 kg (203 lb 1.6 oz) 91.989 kg (202 lb 12.8 oz)    History of present illness:  48 y.o. female with history of cirrhosis of liver who was admitted last December for GI bleed from esophageal varices and had banding twice since then presents the ER because of black stools. Patient states that a day before patient had some dark stools and last evening patient had 2 bowel movements which were black. Patient also has been having abdominal discomfort since last 2 days. Denies any nausea vomiting fever chills dizziness loss of consciousness or shortness of breath. Patient at times had neck pressure. In the ER patient was found to be mildly tachycardic and hemoglobin is around 8.4. On call gastroenterologist Dr. Henrene Pastor was consulted by Dr. Marnette Burgess ER physician. At this time patient has been started on octreotide and Protonix infusion and admitted for further management.    Hospital Course:  Cirrhosis of liver 2/2 NASH/Thrombocytopenia  -Liver functions and platelets at baseline  -No ascites noted but given abdominal pain was started on empiric Rocephin to cover for SBP but subsequent exam not c/w this so  Rocephin was discontinued. -pain was epigastric and appears to have decreased since admit and after initiation of PPI   GI bleed   -Gastroenterology consulted  -EGD today: 1) Esophageal varices - Small and nonbleeding. 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  -If bleeding recurs GI suspects fundic varices would be likely source  -cont octreotide and Protonix  -follow up in office in 2 weeks   Acute blood loss anemia  -Baseline hemoglobin around 9  -Currently had drifted down to 7.1, gave one unit PRBC. -Will check CBC before the discharge.  Azotemia/Acute renal failure  -Multifactorial secondary to GI bleeding as well as dehydration  - Resolved, Cr is 1.30  HTN (hypertension)  - Continue Amlodipine, Lisinopril.  DM (diabetes mellitus)  -On metformin and glipizide   Abnormal urinalysis  -Nitrite negative but does have moderate leukocytes in 7-10 wbc's so could have underlying urinary tract infection but urine culture no growth  -dc Rocephin   Procedures:  None  Consultations:  GI  Discharge Exam: Filed Vitals:   04/23/13 1641  BP: 128/81  Pulse: 64  Temp: 98.6 F (37 C)  Resp: 16    General: Appear in no acute distress Cardiovascular: S1s2 RRR Respiratory: *Clear bilaterally  Discharge Instructions  Discharge Orders   Future Orders Complete By Expires   Diet - low sodium heart healthy  As directed    Increase activity slowly  As directed        Medication List         amLODipine 10 MG tablet  Commonly known as:  NORVASC  Take 1 tablet (10 mg total) by mouth daily.     glipiZIDE 10 MG tablet  Commonly known as:  GLUCOTROL  Take 10 mg by mouth daily before breakfast.     lisinopril 20 MG tablet  Commonly known as:  PRINIVIL,ZESTRIL  Take 1 tablet (20 mg total) by mouth daily.     metFORMIN 500 MG (MOD) 24 hr tablet  Commonly known as:  GLUMETZA  Take 500 mg by mouth 2 (two) times daily with a meal.     oxyCODONE-acetaminophen 5-325 MG per tablet  Commonly known as:  ROXICET  Take 1 tablet by mouth every 6 (six) hours as needed for severe pain.     pantoprazole 40 MG  tablet  Commonly known as:  PROTONIX  Take 1 tablet (40 mg total) by mouth daily.       No Known Allergies     Follow-up Information   Follow up with PCP  In 2 weeks.       The results of significant diagnostics from this hospitalization (including imaging, microbiology, ancillary and laboratory) are listed below for reference.    Significant Diagnostic Studies: Dg Chest Portable 1 View  04/21/2013   CLINICAL DATA:  Abdominal pain.  Nausea.  Headache.  EXAM: PORTABLE CHEST - 1 VIEW  COMPARISON:  No priors.  FINDINGS: Lung volumes are normal. No consolidative airspace disease. No pleural effusions. No pneumothorax. No pulmonary nodule or mass noted. Pulmonary vasculature and the cardiomediastinal silhouette are within normal limits.  IMPRESSION: 1.  No radiographic evidence of acute cardiopulmonary disease.   Electronically Signed   By: Vinnie Langton M.D.   On: 04/21/2013 01:00    Microbiology: Recent Results (from the past 240 hour(s))  MRSA PCR SCREENING     Status: None   Collection Time    04/21/13  3:51 AM      Result Value Ref Range Status   MRSA by PCR NEGATIVE  NEGATIVE Final   Comment:            The GeneXpert MRSA Assay (FDA     approved for NASAL specimens     only), is one component of a     comprehensive MRSA colonization     surveillance program. It is not     intended to diagnose MRSA     infection nor to guide or     monitor treatment for     MRSA infections.  URINE CULTURE     Status: None   Collection Time    04/21/13  6:39 AM      Result Value Ref Range Status   Specimen Description URINE, CLEAN CATCH   Final   Special Requests NONE   Final   Culture  Setup Time     Final   Value: 04/21/2013 12:49     Performed at [place]     Final   Value: NO GROWTH     Performed at [facility]   Culture     Final   Value: NO GROWTH     Performed at [facility]   Report Status 04/22/2013 FINAL   Final      Labs: Basic Metabolic Panel:  Recent Labs Lab 04/21/13 0021 04/21/13 0420 04/22/13 0925 04/23/13 0300  NA 140 139 141 139  K 4.2 4.3 3.9 4.1  CL 103 105 108 105  CO2 24 20 21 20   GLUCOSE 141* 224* 147* 308*  BUN 29* 27* 17 21  CREATININE 1.19* 1.17* 1.11* 1.30*  CALCIUM 9.3 8.5 8.2* 8.5   Liver Function Tests:  Recent  Labs Lab 04/21/13 0021 04/21/13 0420  AST 38* 33  ALT 25 23  ALKPHOS 187* 175*  BILITOT 0.2* 0.2*  PROT 7.4 7.0  ALBUMIN 3.1* 3.0*    Recent Labs Lab 04/21/13 0021  LIPASE 66*   No results found for this basename: AMMONIA,  in the last 168 hours CBC:  Recent Labs Lab 04/21/13 0021 04/21/13 0420 04/21/13 0815 04/21/13 1124 04/22/13 0925 04/23/13 0300  WBC 5.8 5.7 4.6 4.2 3.3* 3.3*  NEUTROABS 3.8  --   --   --   --   --   HGB 8.4* 7.7* 7.7* 7.6* 8.1* 7.1*  HCT 25.5* 23.2* 23.7* 23.3* 24.5* 22.0*  MCV 84.4 84.7 84.9 84.7 86.3 85.3  PLT 144* 124* 112* 117* 112* 128*   Cardiac Enzymes:  Recent Labs Lab 04/21/13 0420  TROPONINI <0.30   BNP: BNP (last 3 results) No results found for this basename: PROBNP,  in the last 8760 hours CBG:  Recent Labs Lab 04/22/13 1652 04/23/13 0015 04/23/13 0357 04/23/13 0742 04/23/13 1640  GLUCAP 217* 229* 290* 267* 224*       Signed:  Arianie Couse S  [facility] 04/23/2013, 6:40 PM

## 2013-04-23 NOTE — Progress Notes (Signed)
1U PRBC hung at 12:10.  Patient complained of severe burning at IV site on right forearm.  Blood transfusion switched to IV site on right wrist.  Patient complained again of burning at this IV site. No erythema or edema noted at site.   Transfusion paused, MD notified.  IV team attempted restart in patient [facility] without success.  25mg  IV benedryl given.  Patient Vitals stable.  Transfusion restarted at 13:40.  Will continue to monitor IV site for signs of reaction.

## 2013-04-23 NOTE — Progress Notes (Signed)
Pt hgb result of 9.9 post transfusion. Page to K. Schorr for notification with call back. OK to discharge pt as planned. Pt after visit summary reviewed and pt capable of re verbalizing medications and follow up appointments. Pt remains stable. No signs and symptoms of distress. Educated to return to ER in the event of SOB, dizziness, chest pain, or fainting. Mady Gemma, RN

## 2013-04-23 NOTE — Progress Notes (Signed)
Inpatient Diabetes Program Recommendations  AACE/ADA: New Consensus Statement on Inpatient Glycemic Control (2013)  Target Ranges:  Prepandial:   less than 140 mg/dL      Peak postprandial:   less than 180 mg/dL (1-2 hours)      Critically ill patients:  140 - 180 mg/dL   Reason for Assessment:Results for KHAMANI, DANIELY (MRN [id number]) as of 04/23/2013 12:51  Ref. Range 04/22/2013 07:39 04/22/2013 16:52 04/23/2013 00:15 04/23/2013 03:57 04/23/2013 07:42  Glucose-Capillary Latest Range: 70-99 mg/dL 143 (H) 217 (H) 229 (H) 290 (H) 267 (H)     Diabetes history: Type 2 diabetes Outpatient Diabetes medications: Glipizide 10 mg daily, Glumetza 500 mg bid Current orders for Inpatient glycemic control: Novolog sensitive tid with meals  Note that CBG's elevated.  Patient has history of cirrhosis, please consider discontinuation of Glumetza at discharge.  Also consider adding Lantus 10 units daily.    Thanks, Adah Perl, RN, BC-ADM Inpatient Diabetes Coordinator Pager [phone]

## 2013-04-24 LAB — TYPE AND SCREEN
ABO/RH(D): A POS
Antibody Screen: NEGATIVE
Unit division: 0

## 2013-04-24 LAB — GLUCOSE, CAPILLARY: Glucose-Capillary: 215 mg/dL — ABNORMAL HIGH (ref 70–99)

## 2013-04-25 NOTE — Addendum Note (Signed)
Addendum created 04/25/13 0902 by Lillia Abed, MD   Modules edited: Anesthesia Responsible Staff

## 2013-11-18 ENCOUNTER — Emergency Department: Payer: Self-pay | Admitting: Emergency Medicine

## 2013-11-18 LAB — COMPREHENSIVE METABOLIC PANEL
Albumin: 3 g/dL — ABNORMAL LOW (ref 3.4–5.0)
Alkaline Phosphatase: 147 U/L — ABNORMAL HIGH
Anion Gap: 10 (ref 7–16)
BUN: 15 mg/dL (ref 7–18)
Bilirubin,Total: 0.2 mg/dL (ref 0.2–1.0)
Calcium, Total: 8.3 mg/dL — ABNORMAL LOW (ref 8.5–10.1)
Chloride: 106 mmol/L (ref 98–107)
Co2: 25 mmol/L (ref 21–32)
Creatinine: 1.2 mg/dL (ref 0.60–1.30)
EGFR (African American): >60
EGFR (Non-African Amer.): 51 — ABNORMAL LOW
Glucose: 174 mg/dL — ABNORMAL HIGH (ref 65–99)
Osmolality: 286 (ref 275–301)
Potassium: 3.5 mmol/L (ref 3.5–5.1)
SGOT(AST): 19 U/L (ref 15–37)
SGPT (ALT): 15 U/L
Sodium: 141 mmol/L (ref 136–145)
TOTAL PROTEIN: 8.1 g/dL (ref 6.4–8.2)

## 2013-11-18 LAB — CBC WITH DIFFERENTIAL/PLATELET
Basophil #: 0 10*3/uL (ref 0.0–0.1)
Basophil %: 0.4 %
EOS PCT: 0.9 %
Eosinophil #: 0 10*3/uL (ref 0.0–0.7)
HCT: 34.2 % — ABNORMAL LOW (ref 35.0–47.0)
HGB: 10.8 g/dL — ABNORMAL LOW (ref 12.0–16.0)
Lymphocyte #: 1.1 10*3/uL (ref 1.0–3.6)
Lymphocyte %: 22.7 %
MCH: 25.4 pg — ABNORMAL LOW (ref 26.0–34.0)
MCHC: 31.5 g/dL — ABNORMAL LOW (ref 32.0–36.0)
MCV: 81 fL (ref 80–100)
MONO ABS: 0.4 x10 3/mm (ref 0.2–0.9)
Monocyte %: 7.6 %
NEUTROS ABS: 3.2 10*3/uL (ref 1.4–6.5)
Neutrophil %: 68.4 %
Platelet: 111 10*3/uL — ABNORMAL LOW (ref 150–440)
RBC: 4.23 10*6/uL (ref 3.80–5.20)
RDW: 17.6 % — AB (ref 11.5–14.5)
WBC: 4.7 10*3/uL (ref 3.6–11.0)

## 2013-11-18 LAB — URINALYSIS, COMPLETE
BILIRUBIN, UR: NEGATIVE
Bacteria: NONE SEEN
GLUCOSE, UR: NEGATIVE mg/dL (ref 0–75)
KETONE: NEGATIVE
Leukocyte Esterase: NEGATIVE
Nitrite: NEGATIVE
PH: 6 (ref 4.5–8.0)
Protein: 100
SPECIFIC GRAVITY: 1.004 (ref 1.003–1.030)
Squamous Epithelial: 1
WBC UR: 3 /HPF (ref 0–5)

## 2013-11-18 LAB — LIPASE, BLOOD: LIPASE: 201 U/L (ref 73–393)

## 2013-11-18 LAB — TROPONIN I

## 2014-01-18 ENCOUNTER — Emergency Department: Payer: Self-pay | Admitting: Emergency Medicine

## 2014-01-18 LAB — COMPREHENSIVE METABOLIC PANEL
ALBUMIN: 2.9 g/dL — AB (ref 3.4–5.0)
ALK PHOS: 162 U/L — AB
ALT: 17 U/L
ANION GAP: 9 (ref 7–16)
BUN: 13 mg/dL (ref 7–18)
Bilirubin,Total: 1 mg/dL (ref 0.2–1.0)
CALCIUM: 8.4 mg/dL — AB (ref 8.5–10.1)
CHLORIDE: 104 mmol/L (ref 98–107)
CREATININE: 1.29 mg/dL (ref 0.60–1.30)
Co2: 23 mmol/L (ref 21–32)
EGFR (Non-African Amer.): 47 — ABNORMAL LOW
GFR CALC AF AMER: 57 — AB
Glucose: 254 mg/dL — ABNORMAL HIGH (ref 65–99)
Osmolality: 281 (ref 275–301)
Potassium: 3 mmol/L — ABNORMAL LOW (ref 3.5–5.1)
SGOT(AST): 36 U/L (ref 15–37)
Sodium: 136 mmol/L (ref 136–145)
Total Protein: 8.1 g/dL (ref 6.4–8.2)

## 2014-01-18 LAB — URINALYSIS, COMPLETE
BILIRUBIN, UR: NEGATIVE
Hyaline Cast: 2
Nitrite: POSITIVE
Ph: 6 (ref 4.5–8.0)
Protein: >=500
SPECIFIC GRAVITY: 1.011 (ref 1.003–1.030)
Squamous Epithelial: 1

## 2014-01-18 LAB — CBC WITH DIFFERENTIAL/PLATELET
BASOS PCT: 0.2 %
Basophil #: 0 10*3/uL (ref 0.0–0.1)
EOS PCT: 0.1 %
Eosinophil #: 0 10*3/uL (ref 0.0–0.7)
HCT: 31.3 % — AB (ref 35.0–47.0)
HGB: 10.2 g/dL — ABNORMAL LOW (ref 12.0–16.0)
LYMPHS PCT: 5.4 %
Lymphocyte #: 0.7 10*3/uL — ABNORMAL LOW (ref 1.0–3.6)
MCH: 26.6 pg (ref 26.0–34.0)
MCHC: 32.4 g/dL (ref 32.0–36.0)
MCV: 82 fL (ref 80–100)
Monocyte #: 1 x10 3/mm — ABNORMAL HIGH (ref 0.2–0.9)
Monocyte %: 8.4 %
NEUTROS ABS: 10.4 10*3/uL — AB (ref 1.4–6.5)
NEUTROS PCT: 85.9 %
Platelet: 131 10*3/uL — ABNORMAL LOW (ref 150–440)
RBC: 3.82 10*6/uL (ref 3.80–5.20)
RDW: 17.2 % — AB (ref 11.5–14.5)
WBC: 12.1 10*3/uL — ABNORMAL HIGH (ref 3.6–11.0)

## 2014-01-18 LAB — TROPONIN I: Troponin-I: <0.02 ng/mL

## 2014-01-18 LAB — LIPASE, BLOOD: Lipase: 108 U/L (ref 73–393)

## 2014-01-21 LAB — URINE CULTURE

## 2014-05-01 ENCOUNTER — Emergency Department
Admit: 2014-05-01 | Disposition: A | Discharge status: Home / Self Care | Payer: Self-pay | Admitting: Emergency Medicine

## 2014-11-11 ENCOUNTER — Other Ambulatory Visit: Payer: Self-pay | Admitting: Internal Medicine

## 2014-11-11 ENCOUNTER — Inpatient Hospital Stay
Admission: EM | Admit: 2014-11-11 | Discharge: 2014-11-16 | DRG: 378 | Disposition: A | Discharge status: Home / Self Care | Payer: Self-pay | Attending: Internal Medicine | Admitting: Internal Medicine

## 2014-11-11 DIAGNOSIS — I10 Essential (primary) hypertension: Secondary | ICD-10-CM | POA: Diagnosis present

## 2014-11-11 DIAGNOSIS — E876 Hypokalemia: Secondary | ICD-10-CM | POA: Diagnosis present

## 2014-11-11 DIAGNOSIS — R944 Abnormal results of kidney function studies: Secondary | ICD-10-CM | POA: Diagnosis present

## 2014-11-11 DIAGNOSIS — K2970 Gastritis, unspecified, without bleeding: Secondary | ICD-10-CM | POA: Diagnosis present

## 2014-11-11 DIAGNOSIS — K7460 Unspecified cirrhosis of liver: Secondary | ICD-10-CM

## 2014-11-11 DIAGNOSIS — Z8249 Family history of ischemic heart disease and other diseases of the circulatory system: Secondary | ICD-10-CM

## 2014-11-11 DIAGNOSIS — R1310 Dysphagia, unspecified: Secondary | ICD-10-CM | POA: Diagnosis present

## 2014-11-11 DIAGNOSIS — R0602 Shortness of breath: Secondary | ICD-10-CM

## 2014-11-11 DIAGNOSIS — K766 Portal hypertension: Secondary | ICD-10-CM | POA: Diagnosis present

## 2014-11-11 DIAGNOSIS — I8510 Secondary esophageal varices without bleeding: Secondary | ICD-10-CM | POA: Diagnosis present

## 2014-11-11 DIAGNOSIS — Z79899 Other long term (current) drug therapy: Secondary | ICD-10-CM

## 2014-11-11 DIAGNOSIS — K3189 Other diseases of stomach and duodenum: Secondary | ICD-10-CM | POA: Diagnosis present

## 2014-11-11 DIAGNOSIS — Z9889 Other specified postprocedural states: Secondary | ICD-10-CM

## 2014-11-11 DIAGNOSIS — J811 Chronic pulmonary edema: Secondary | ICD-10-CM | POA: Diagnosis present

## 2014-11-11 DIAGNOSIS — K7030 Alcoholic cirrhosis of liver without ascites: Secondary | ICD-10-CM | POA: Diagnosis present

## 2014-11-11 DIAGNOSIS — D649 Anemia, unspecified: Secondary | ICD-10-CM | POA: Diagnosis present

## 2014-11-11 DIAGNOSIS — E119 Type 2 diabetes mellitus without complications: Secondary | ICD-10-CM | POA: Diagnosis present

## 2014-11-11 DIAGNOSIS — K7581 Nonalcoholic steatohepatitis (NASH): Secondary | ICD-10-CM | POA: Diagnosis present

## 2014-11-11 DIAGNOSIS — K922 Gastrointestinal hemorrhage, unspecified: Principal | ICD-10-CM | POA: Diagnosis present

## 2014-11-11 DIAGNOSIS — R0902 Hypoxemia: Secondary | ICD-10-CM | POA: Diagnosis present

## 2014-11-11 DIAGNOSIS — R160 Hepatomegaly, not elsewhere classified: Secondary | ICD-10-CM

## 2014-11-11 DIAGNOSIS — K219 Gastro-esophageal reflux disease without esophagitis: Secondary | ICD-10-CM | POA: Diagnosis present

## 2014-11-11 DIAGNOSIS — D696 Thrombocytopenia, unspecified: Secondary | ICD-10-CM | POA: Diagnosis present

## 2014-11-11 LAB — BASIC METABOLIC PANEL
Anion gap: 6 (ref 5–15)
BUN: 17 mg/dL (ref 6–20)
CHLORIDE: 109 mmol/L (ref 101–111)
CO2: 25 mmol/L (ref 22–32)
Calcium: 8.4 mg/dL — ABNORMAL LOW (ref 8.9–10.3)
Creatinine, Ser: 1.24 mg/dL — ABNORMAL HIGH (ref 0.44–1.00)
GFR calc non Af Amer: 51 mL/min — ABNORMAL LOW (ref >60)
GFR, EST AFRICAN AMERICAN: 59 mL/min — AB (ref >60)
Glucose, Bld: 139 mg/dL — ABNORMAL HIGH (ref 65–99)
POTASSIUM: 3.2 mmol/L — AB (ref 3.5–5.1)
SODIUM: 140 mmol/L (ref 135–145)

## 2014-11-11 LAB — CBC
HEMATOCRIT: 21.8 % — AB (ref 35.0–47.0)
Hemoglobin: 6.9 g/dL — ABNORMAL LOW (ref 12.0–16.0)
MCH: 24.2 pg — ABNORMAL LOW (ref 26.0–34.0)
MCHC: 31.5 g/dL — ABNORMAL LOW (ref 32.0–36.0)
MCV: 76.6 fL — AB (ref 80.0–100.0)
Platelets: 111 10*3/uL — ABNORMAL LOW (ref 150–440)
RBC: 2.85 MIL/uL — AB (ref 3.80–5.20)
RDW: 15.7 % — ABNORMAL HIGH (ref 11.5–14.5)
WBC: 3.6 10*3/uL (ref 3.6–11.0)

## 2014-11-11 LAB — TROPONIN I: Troponin I: <0.03 ng/mL (ref <0.031)

## 2014-11-11 LAB — PROTIME-INR
INR: 1.35
PROTHROMBIN TIME: 16.9 s — AB (ref 11.4–15.0)

## 2014-11-11 LAB — GLUCOSE, CAPILLARY: GLUCOSE-CAPILLARY: 172 mg/dL — AB (ref 65–99)

## 2014-11-11 LAB — ABO/RH: ABO/RH(D): A POS

## 2014-11-11 MED ORDER — ONDANSETRON HCL 4 MG/2ML IJ SOLN
INTRAMUSCULAR | Status: AC
  Filled 2014-11-11: qty 2

## 2014-11-11 MED ORDER — AMLODIPINE BESYLATE 10 MG PO TABS
10.0000 mg | ORAL_TABLET | Freq: Every day | ORAL | Status: DC
  Administered 2014-11-12 – 2014-11-16 (×5): 10 mg via ORAL
  Filled 2014-11-11 (×5): qty 1

## 2014-11-11 MED ORDER — OXYCODONE HCL 5 MG PO TABS
5.0000 mg | ORAL_TABLET | ORAL | Status: DC | PRN
  Administered 2014-11-12 – 2014-11-15 (×9): 5 mg via ORAL
  Filled 2014-11-11 (×10): qty 1

## 2014-11-11 MED ORDER — PANTOPRAZOLE SODIUM 40 MG IV SOLR
40.0000 mg | Freq: Once | INTRAVENOUS | Status: AC
  Administered 2014-11-11: 40 mg via INTRAVENOUS
  Filled 2014-11-11: qty 40

## 2014-11-11 MED ORDER — CIPROFLOXACIN IN D5W 400 MG/200ML IV SOLN
400.0000 mg | Freq: Once | INTRAVENOUS | Status: AC
  Administered 2014-11-11: 400 mg via INTRAVENOUS
  Filled 2014-11-11: qty 200

## 2014-11-11 MED ORDER — ONDANSETRON HCL 4 MG/2ML IJ SOLN
4.0000 mg | Freq: Four times a day (QID) | INTRAMUSCULAR | Status: DC | PRN
  Administered 2014-11-12 – 2014-11-13 (×4): 4 mg via INTRAVENOUS
  Filled 2014-11-11 (×5): qty 2

## 2014-11-11 MED ORDER — ONDANSETRON HCL 4 MG PO TABS
4.0000 mg | ORAL_TABLET | Freq: Four times a day (QID) | ORAL | Status: DC | PRN
  Administered 2014-11-13 – 2014-11-15 (×3): 4 mg via ORAL
  Filled 2014-11-11 (×3): qty 1

## 2014-11-11 MED ORDER — DOCUSATE SODIUM 100 MG PO CAPS
100.0000 mg | ORAL_CAPSULE | Freq: Two times a day (BID) | ORAL | Status: DC
  Administered 2014-11-12 – 2014-11-15 (×8): 100 mg via ORAL
  Filled 2014-11-11 (×8): qty 1

## 2014-11-11 MED ORDER — MORPHINE SULFATE (PF) 4 MG/ML IV SOLN
INTRAVENOUS | Status: AC
  Filled 2014-11-11: qty 1

## 2014-11-11 MED ORDER — SODIUM CHLORIDE 0.9 % IV SOLN
10.0000 mL/h | Freq: Once | INTRAVENOUS | Status: AC
  Administered 2014-11-11: 10 mL/h via INTRAVENOUS

## 2014-11-11 MED ORDER — METOPROLOL TARTRATE 50 MG PO TABS
50.0000 mg | ORAL_TABLET | Freq: Two times a day (BID) | ORAL | Status: DC
  Administered 2014-11-12 – 2014-11-16 (×9): 50 mg via ORAL
  Filled 2014-11-11 (×10): qty 1

## 2014-11-11 MED ORDER — PANTOPRAZOLE SODIUM 40 MG IV SOLR
40.0000 mg | Freq: Two times a day (BID) | INTRAVENOUS | Status: DC
  Administered 2014-11-12 – 2014-11-13 (×3): 40 mg via INTRAVENOUS
  Filled 2014-11-11 (×3): qty 40

## 2014-11-11 MED ORDER — INSULIN ASPART 100 UNIT/ML ~~LOC~~ SOLN
0.0000 [IU] | Freq: Three times a day (TID) | SUBCUTANEOUS | Status: DC
  Administered 2014-11-12: 2 [IU] via SUBCUTANEOUS
  Administered 2014-11-12: 3 [IU] via SUBCUTANEOUS
  Administered 2014-11-13: 14:00:00 2 [IU] via SUBCUTANEOUS
  Administered 2014-11-13: 8 [IU] via SUBCUTANEOUS
  Administered 2014-11-14: 12:00:00 5 [IU] via SUBCUTANEOUS
  Administered 2014-11-14: 09:00:00 3 [IU] via SUBCUTANEOUS
  Administered 2014-11-14 – 2014-11-15 (×2): 5 [IU] via SUBCUTANEOUS
  Administered 2014-11-15: 3 [IU] via SUBCUTANEOUS
  Administered 2014-11-15: 17:00:00 8 [IU] via SUBCUTANEOUS
  Administered 2014-11-16 (×2): 3 [IU] via SUBCUTANEOUS
  Filled 2014-11-11: qty 5
  Filled 2014-11-11: qty 3
  Filled 2014-11-11: qty 8
  Filled 2014-11-11 (×3): qty 3
  Filled 2014-11-11: qty 2
  Filled 2014-11-11: qty 5
  Filled 2014-11-11 (×2): qty 8
  Filled 2014-11-11: qty 5
  Filled 2014-11-11: qty 3
  Filled 2014-11-11: qty 2

## 2014-11-11 MED ORDER — GLIPIZIDE ER 5 MG PO TB24
10.0000 mg | ORAL_TABLET | Freq: Every day | ORAL | Status: DC
  Administered 2014-11-12: 11:00:00 10 mg via ORAL
  Filled 2014-11-11: qty 2

## 2014-11-11 MED ORDER — SODIUM CHLORIDE 0.9 % IV SOLN
INTRAVENOUS | Status: DC
  Administered 2014-11-12 – 2014-11-13 (×2): via INTRAVENOUS

## 2014-11-11 MED ORDER — CYCLOBENZAPRINE HCL 10 MG PO TABS: 10.0000 mg | ORAL_TABLET | Freq: Three times a day (TID) | ORAL | Status: DC | PRN

## 2014-11-11 MED ORDER — HYDRALAZINE HCL 20 MG/ML IJ SOLN: 10.0000 mg | Freq: Four times a day (QID) | INTRAMUSCULAR | Status: DC | PRN

## 2014-11-11 NOTE — ED Provider Notes (Signed)
[facility] Emergency Department Provider Note  ____________________________________________  Time seen: 2020  I have reviewed the triage vital signs and the nursing notes.   HISTORY  Chief Complaint Shortness of Breath   History limited by: Not Limited   HPI Selena Bennett is a 49 y.o. female who presents to the emergency department today because of shortness breath. She states she developed the shortness breath today. She did feel a little off when she went to bed this morning after her third shift. She states when she woke up she was feeling short of breath. She denies any associated pain in her chest. The patient does state however that she has been eating ice cubes for the past couple of weeks. The patient does have a history of anemia in the past. Patient has a transfusion performed roughly 1-1/2 years ago after esophageal variceal bleeding. The patient has not noticed any black or tarry stools. Has not noticed any red stool.  Past Medical History  Diagnosis Date  . Diabetes   . Hypertension   . Esophageal varices with hemorrhage 01/30/2013  . Anemia     [facility] 02-04-13 acute blood loss  . GERD (gastroesophageal reflux disease)     Patient Active Problem List   Diagnosis Date Noted  . GI bleed 04/21/2013  . Azotemia 04/21/2013  . Acute renal failure ([place]) 04/21/2013  . Thrombocytopenia, unspecified ([place]) 02/05/2013  . Odynophagia 02/01/2013  . Liver lesion 02/01/2013  . HTN (hypertension) 01/30/2013  . DM (diabetes mellitus) ([place]) 01/30/2013  . Liver cirrhosis secondary to NASH (nonalcoholic steatohepatitis) 01/30/2013  . Acute blood loss anemia 01/30/2013  . Esophageal varices with hemorrhage ([place]) 01/30/2013  . GIB (gastrointestinal bleeding) 01/28/2013    Past Surgical History  Procedure Laterality Date  . Cholecystectomy open    . Esophagogastroduodenoscopy N/A 01/28/2013    Procedure: ESOPHAGOGASTRODUODENOSCOPY (EGD);  Surgeon:  Beryle Beams, MD;  Location: [facility] ENDOSCOPY;  Service: Endoscopy;  Laterality: N/A;  bedside  . Esophagogastroduodenoscopy N/A 02/21/2013    Procedure: ESOPHAGOGASTRODUODENOSCOPY (EGD);  Surgeon: Beryle Beams, MD;  Location: [facility] ENDOSCOPY;  Service: Endoscopy;  Laterality: N/A;  . Esophageal banding N/A 02/21/2013    Procedure: ESOPHAGEAL BANDING;  Surgeon: Beryle Beams, MD;  Location: [facility] ENDOSCOPY;  Service: Endoscopy;  Laterality: N/A;  . C section x1      1989  . Esophagogastroduodenoscopy N/A 03/21/2013    Procedure: ESOPHAGOGASTRODUODENOSCOPY (EGD);  Surgeon: Beryle Beams, MD;  Location: [facility] ENDOSCOPY;  Service: Endoscopy;  Laterality: N/A;  . Esophageal banding N/A 03/21/2013    Procedure: ESOPHAGEAL BANDING;  Surgeon: Beryle Beams, MD;  Location: [facility] ENDOSCOPY;  Service: Endoscopy;  Laterality: N/A;  . Esophagogastroduodenoscopy N/A 04/22/2013    Procedure: ESOPHAGOGASTRODUODENOSCOPY (EGD);  Surgeon: Beryle Beams, MD;  Location: [facility] ENDOSCOPY;  Service: Endoscopy;  Laterality: N/A;    Current Outpatient Rx  Name  Route  Sig  Dispense  Refill  . amLODipine (NORVASC) 10 MG tablet   Oral   Take 1 tablet (10 mg total) by mouth daily.   30 tablet   0   . glipiZIDE (GLUCOTROL) 10 MG tablet   Oral   Take 10 mg by mouth daily before breakfast.         . lisinopril (PRINIVIL,ZESTRIL) 20 MG tablet   Oral   Take 1 tablet (20 mg total) by mouth daily.   30 tablet   0   . metFORMIN (GLUMETZA) 500 MG (MOD) 24 hr tablet  Oral   Take 500 mg by mouth 2 (two) times daily with a meal.         . oxyCODONE-acetaminophen (ROXICET) 5-325 MG per tablet   Oral   Take 1 tablet by mouth every 6 (six) hours as needed for severe pain.   30 tablet   0   . pantoprazole (PROTONIX) 40 MG tablet   Oral   Take 1 tablet (40 mg total) by mouth daily.   30 tablet   0     Allergies Review of patient's allergies indicates no known allergies.  No family history on file.  Social  History Social History  Substance Use Topics  . Smoking status: Never Smoker   . Smokeless tobacco: Never Used  . Alcohol Use: No    Review of Systems  Constitutional: Negative for fever. Cardiovascular: Negative for chest pain. Respiratory: positive for shortness of breath. Gastrointestinal: Negative for abdominal pain, vomiting and diarrhea. Genitourinary: Negative for dysuria. Musculoskeletal: Negative for back pain. Skin: Negative for rash. Neurological: Negative for headaches, focal weakness or numbness.   10-point ROS otherwise negative.  ____________________________________________   PHYSICAL EXAM:  VITAL SIGNS: ED Triage Vitals  Enc Vitals Group     BP 11/11/14 2047 170/79 mmHg     Pulse Rate 11/11/14 2047 89     Resp 11/11/14 2047 18     Temp 11/11/14 2047 98 F (36.7 C)     Temp Source 11/11/14 2047 Oral     SpO2 11/11/14 2047 96 %     Weight 11/11/14 2047 215 lb (97.523 kg)     Height 11/11/14 2047 5\' 3"  (1.6 m)   Constitutional: Alert and oriented. Well appearing and in no distress. Eyes: Conjunctivae are slightly pale. PERRL. Normal extraocular movements. ENT   Head: Normocephalic and atraumatic.   Nose: No congestion/rhinnorhea.   Mouth/Throat: Mucous membranes are moist.    Neck: No stridor. Hematological/Lymphatic/Immunilogical: No cervical lymphadenopathy. Cardiovascular: Normal rate, regular rhythm.  No murmurs, rubs, or gallops. Respiratory: Normal respiratory effort without tachypnea nor retractions. Breath sounds are clear and equal bilaterally. No wheezes/rales/rhonchi. Gastrointestinal: Soft and nontender. No distention. There is no CVA tenderness. Rectal: GUIAC negative Genitourinary: Deferred Musculoskeletal: Normal range of motion in all extremities. No joint effusions.  No lower extremity tenderness nor edema. Neurologic:  Normal speech and language. No gross focal neurologic deficits are appreciated. Speech is normal.   Skin:  Skin is warm, dry and intact. No rash noted. Psychiatric: Mood and affect are normal. Speech and behavior are normal. Patient exhibits appropriate insight and judgment.  ____________________________________________    LABS (pertinent positives/negatives)  Labs Reviewed  BASIC METABOLIC PANEL - Abnormal; Notable for the following:    Potassium 3.2 (*)    Glucose, Bld 139 (*)    Creatinine, Ser 1.24 (*)    Calcium 8.4 (*)    GFR calc non Af Amer 51 (*)    GFR calc Af Amer 59 (*)    All other components within normal limits  CBC - Abnormal; Notable for the following:    RBC 2.85 (*)    Hemoglobin 6.9 (*)    HCT 21.8 (*)    MCV 76.6 (*)    MCH 24.2 (*)    MCHC 31.5 (*)    RDW 15.7 (*)    Platelets 111 (*)    All other components within normal limits  PROTIME-INR - Abnormal; Notable for the following:    Prothrombin Time 16.9 (*)    All other  components within normal limits  TROPONIN I  HEPATIC FUNCTION PANEL  TYPE AND SCREEN  PREPARE RBC (CROSSMATCH)     ____________________________________________   EKG  I, Nance Pear, attending physician, personally viewed and interpreted this EKG  EKG Time: 2056 Rate: 84 Rhythm: NSR Axis: normal Intervals: qtc 451 QRS: narrow ST changes: no st elevation Impression: normal ekg   ____________________________________________    RADIOLOGY  None   ____________________________________________   PROCEDURES  Procedure(s) performed: None  Critical Care performed: Yes, see critical care note(s)  CRITICAL CARE Performed by: Nance Pear   Total critical care time: 30  Critical care time was exclusive of separately billable procedures and treating other patients.  Critical care was necessary to treat or prevent imminent or life-threatening deterioration.  Critical care was time spent personally by me on the following activities: development of treatment plan with patient and/or surrogate as well as  nursing, discussions with consultants, evaluation of patient's response to treatment, examination of patient, obtaining history from patient or surrogate, ordering and performing treatments and interventions, ordering and review of laboratory studies, ordering and review of radiographic studies, pulse oximetry and re-evaluation of patient's condition.  ____________________________________________   INITIAL IMPRESSION / ASSESSMENT AND PLAN / ED COURSE  Pertinent labs & imaging results that were available during my care of the patient were reviewed by me and considered in my medical decision making (see chart for details).  Patient presented to the emergency department today because of concerns for shortness of breath. She also has been craving ice for the past couple weeks. Blood work is significant for anemia. I do think this likely explains the shortness of breath. White was negative however there is only a small amount of stool on the glove. Given that the patient does have a history of esophageal varices I do think this is a highly likely etiology. Because of this I will write for IV Protonix as well as antibiotics. In addition I consented and wrote for blood transfusion. The patient will require admission to the hospital for further management.  ____________________________________________   FINAL CLINICAL IMPRESSION(S) / ED DIAGNOSES  Final diagnoses:  Anemia, unspecified anemia type  Shortness of breath     Nance Pear, MD 11/11/14 2335

## 2014-11-11 NOTE — H&P (Signed)
Selena Bennett at [place] NAME: Selena Bennett    MR#:  [id number]  DATE OF BIRTH:  1965-11-30  DATE OF ADMISSION:  11/11/2014  PRIMARY CARE PHYSICIAN: [facility]   REQUESTING/REFERRING PHYSICIAN: Dr Archie Balboa  CHIEF COMPLAINT:  Shortness of breath weakness  HISTORY OF PRESENT ILLNESS:  Selena Bennett  is a 49 y.o. female with a known history of cirrhosis due to NASH, history of esophageal varices status post banding 2, type 2 diabetes, hypertension comes in the emergency room with complaints of increasing shortness of breath weakness and left arm pain. She also complained of some distended abdomen. Denies any melena vomiting blood or any foul-smelling urine. Denies any fever. Denies any chest pain. Patient was found to have hematocrit of 6.9. Her hemoglobin was 10.5 checked by her primary care physician few months ago.  She has history of cirrhosis of liver with esophageal varices status post banding 2 in 2015. Procedure was done by Dr. Dava Najjar at [facility]. Patient denies any history of alcoholism. She is supposed to get 2 units of blood transfusion that was ordered by the ER physician. Patient is being admitted for further pressure management for acute on chronic anemia likely secondary to chronic slow GI bleed.  PAST MEDICAL HISTORY:   Past Medical History  Diagnosis Date  . Diabetes   . Hypertension   . Esophageal varices with hemorrhage 01/30/2013  . Anemia     [facility] 02-04-13 acute blood loss  . GERD (gastroesophageal reflux disease)     PAST SURGICAL HISTOIRY:   Past Surgical History  Procedure Laterality Date  . Cholecystectomy open    . Esophagogastroduodenoscopy N/A 01/28/2013    Procedure: ESOPHAGOGASTRODUODENOSCOPY (EGD);  Surgeon: Beryle Beams, MD;  Location: [facility] ENDOSCOPY;  Service: Endoscopy;  Laterality: N/A;  bedside  . Esophagogastroduodenoscopy N/A 02/21/2013    Procedure: ESOPHAGOGASTRODUODENOSCOPY  (EGD);  Surgeon: Beryle Beams, MD;  Location: [facility] ENDOSCOPY;  Service: Endoscopy;  Laterality: N/A;  . Esophageal banding N/A 02/21/2013    Procedure: ESOPHAGEAL BANDING;  Surgeon: Beryle Beams, MD;  Location: [facility] ENDOSCOPY;  Service: Endoscopy;  Laterality: N/A;  . C section x1      1989  . Esophagogastroduodenoscopy N/A 03/21/2013    Procedure: ESOPHAGOGASTRODUODENOSCOPY (EGD);  Surgeon: Beryle Beams, MD;  Location: [facility] ENDOSCOPY;  Service: Endoscopy;  Laterality: N/A;  . Esophageal banding N/A 03/21/2013    Procedure: ESOPHAGEAL BANDING;  Surgeon: Beryle Beams, MD;  Location: [facility] ENDOSCOPY;  Service: Endoscopy;  Laterality: N/A;  . Esophagogastroduodenoscopy N/A 04/22/2013    Procedure: ESOPHAGOGASTRODUODENOSCOPY (EGD);  Surgeon: Beryle Beams, MD;  Location: [facility] ENDOSCOPY;  Service: Endoscopy;  Laterality: N/A;    SOCIAL HISTORY:   Social History  Substance Use Topics  . Smoking status: Never Smoker   . Smokeless tobacco: Never Used  . Alcohol Use: No    FAMILY HISTORY:  Hypertension  DRUG ALLERGIES:  No Known Allergies  REVIEW OF SYSTEMS:  Review of Systems  Constitutional: Positive for malaise/fatigue. Negative for fever, chills and weight loss.  HENT: Negative for ear discharge, ear pain and nosebleeds.   Eyes: Negative for blurred vision, pain and discharge.  Respiratory: Positive for shortness of breath. Negative for sputum production, wheezing and stridor.   Cardiovascular: Negative for chest pain, palpitations, orthopnea and PND.  Gastrointestinal: Negative for nausea, vomiting, abdominal pain and diarrhea.  Genitourinary: Negative for urgency and frequency.  Musculoskeletal: Negative for back pain and  joint pain.  Neurological: Positive for weakness. Negative for sensory change, speech change and focal weakness.  Psychiatric/Behavioral: Negative for depression. The patient is not nervous/anxious.      MEDICATIONS AT HOME:   Prior to Admission medications    Medication Sig Start Date End Date Taking? Authorizing Provider  amLODipine (NORVASC) 10 MG tablet Take 1 tablet (10 mg total) by mouth daily. 02/05/13  Yes Nishant Dhungel, MD  aspirin 81 MG chewable tablet Chew 81 mg by mouth daily.   Yes Historical Provider, MD  cyclobenzaprine (FLEXERIL) 10 MG tablet Take 10 mg by mouth 3 (three) times daily as needed for muscle spasms.   Yes Historical Provider, MD  glipiZIDE (GLUCOTROL XL) 10 MG 24 hr tablet Take 10 mg by mouth daily.   Yes Historical Provider, MD  ibuprofen (ADVIL,MOTRIN) 800 MG tablet Take 1 tablet by mouth 3 (three) times daily as needed for moderate pain.    Yes Historical Provider, MD  lisinopril (PRINIVIL,ZESTRIL) 20 MG tablet Take 1 tablet (20 mg total) by mouth daily. Patient taking differently: Take 40 mg by mouth daily.  02/05/13  Yes Nishant Dhungel, MD  metFORMIN (GLUMETZA) 500 MG (MOD) 24 hr tablet Take 500 mg by mouth 2 (two) times daily with a meal.   Yes Historical Provider, MD  metoprolol (LOPRESSOR) 50 MG tablet Take 50 mg by mouth 2 (two) times daily.   Yes Historical Provider, MD  pantoprazole (PROTONIX) 40 MG tablet Take 1 tablet (40 mg total) by mouth daily. Patient taking differently: Take 20 mg by mouth daily.  02/05/13  Yes Nishant Dhungel, MD  oxyCODONE-acetaminophen (ROXICET) 5-325 MG per tablet Take 1 tablet by mouth every 6 (six) hours as needed for severe pain. Patient not taking: Reported on 11/11/2014 02/05/13   Nishant Dhungel, MD      VITAL SIGNS:  Blood pressure 173/73, pulse 81, temperature 98 F (36.7 C), temperature source Oral, resp. rate 18, height 5\' 3"  (1.6 m), weight 97.523 kg (215 lb), last menstrual period 10/26/2014, SpO2 95 %.  PHYSICAL EXAMINATION:  GENERAL:  49 y.o.-year-old patient lying in the bed with no acute distress. Pallor    + EYES: Pupils equal, round, reactive to light and accommodation. No scleral icterus. Extraocular muscles intact.  HEENT: Head atraumatic, normocephalic. Oropharynx  and nasopharynx clear.  NECK:  Supple, no jugular venous distention. No thyroid enlargement, no tenderness.  LUNGS: Normal breath sounds bilaterally, no wheezing, rales,rhonchi or crepitation. No use of accessory muscles of respiration.  CARDIOVASCULAR: S1, S2 normal. No murmurs, rubs, or gallops.  ABDOMEN: Soft, nontender, nondistended. Bowel sounds present. No organomegaly or mass.  EXTREMITIES: No pedal edema, cyanosis, or clubbing.  NEUROLOGIC: Cranial nerves II through XII are intact. Muscle strength 5/5 in all extremities. Sensation intact. Gait not checked.  PSYCHIATRIC: The patient is alert and oriented x 3.  SKIN: No obvious rash, lesion, or ulcer.   LABORATORY PANEL:   CBC  Recent Labs Lab 11/11/14 2102  WBC 3.6  HGB 6.9*  HCT 21.8*  PLT 111*   ------------------------------------------------------------------------------------------------------------------  Chemistries   Recent Labs Lab 11/11/14 2102  NA 140  K 3.2*  CL 109  CO2 25  GLUCOSE 139*  BUN 17  CREATININE 1.24*  CALCIUM 8.4*   ------------------------------------------------------------------------------------------------------------------  Cardiac Enzymes  Recent Labs Lab 11/11/14 2102  TROPONINI <0.03    EKG:  NSR. No acute ST-T changes  IMPRESSION AND PLAN:   49 year old Mr. own with past medical history of cirrhosis secondary to [place], hypertension,  type 2 diabetes comes to the emergency room with increasing shortness of breath feeling weak was found to have hemoglobin of 6.9 and she is being admitted with   1. GI bleed suspect acute on chronic slow GI bleed. History of dysphagia varices status post banding in 2015.   admitted to [place] floor. Patient did get 2 units of blood transfusion as ordered by ER physician today. CBC in the morning. GI consultation. IV Protonix drip twice a day Since patient is not actively bleeding I will hold off on octreotide drip. Risk and benefits of  transfusion discussed with patient she is agreeable to it consent given.  2. Chronic thrombocytopenia Stable at 110K's. This is likely due to cirrhosis  3. Accelerated hypertension Resume home meds. I will hold off on lisinopril given elevated creatinine. When necessary IV hydralazine. Continue amlodipine and metoprolol.  4. Diabetes, type II Sliding scale insulin   continue glyburide. Hold off metformin. Clear liquid diet.  5. History of cirrhosis of liver secondary to NASH Patient has had extensive workup in 2015 at [facility]  6. DVT prophylaxis SCD and teds   All the records are reviewed and case discussed with ED provider. Management plans discussed with the patient, family and they are in agreement.  CODE STATUS: Full  TOTAL TIME TAKING CARE OF THIS PATIENT: 50 minutes.    Vanissa Strength M.D on 11/11/2014 at 11:11 PM  Between 7am to 6pm - Pager - [phone]  After 6pm go to [email or web address] - password [email or web address] [place] [facility]  Office  [phone]  CC: Primary care physician [facility] ([place])

## 2014-11-11 NOTE — ED Notes (Addendum)
Patient ambulatory to triage with steady gait, without difficulty or distress noted; pt reports some intermittent SOB and tingling pain to left arm this evening and has been occuring for last week

## 2014-11-12 ENCOUNTER — Inpatient Hospital Stay: Payer: Self-pay

## 2014-11-12 ENCOUNTER — Encounter: Payer: Self-pay | Admitting: *Deleted

## 2014-11-12 LAB — CBC
HEMATOCRIT: 29.2 % — AB (ref 35.0–47.0)
Hemoglobin: 9.3 g/dL — ABNORMAL LOW (ref 12.0–16.0)
MCH: 25.3 pg — AB (ref 26.0–34.0)
MCHC: 31.8 g/dL — AB (ref 32.0–36.0)
MCV: 79.6 fL — AB (ref 80.0–100.0)
PLATELETS: 120 10*3/uL — AB (ref 150–440)
RBC: 3.67 MIL/uL — ABNORMAL LOW (ref 3.80–5.20)
RDW: 16.3 % — AB (ref 11.5–14.5)
WBC: 7.3 10*3/uL (ref 3.6–11.0)

## 2014-11-12 LAB — GLUCOSE, CAPILLARY
GLUCOSE-CAPILLARY: 133 mg/dL — AB (ref 65–99)
Glucose-Capillary: 115 mg/dL — ABNORMAL HIGH (ref 65–99)
Glucose-Capillary: 155 mg/dL — ABNORMAL HIGH (ref 65–99)
Glucose-Capillary: 146 mg/dL — ABNORMAL HIGH (ref 65–99)
Glucose-Capillary: 151 mg/dL — ABNORMAL HIGH (ref 65–99)

## 2014-11-12 LAB — PREPARE RBC (CROSSMATCH)

## 2014-11-12 MED ORDER — LORAZEPAM 2 MG/ML IJ SOLN
1.0000 mg | INTRAMUSCULAR | Status: DC | PRN
  Administered 2014-11-13: 10:00:00 1 mg via INTRAVENOUS
  Filled 2014-11-12: qty 1

## 2014-11-12 MED ORDER — SODIUM CHLORIDE 0.9 % IJ SOLN
3.0000 mL | Freq: Two times a day (BID) | INTRAMUSCULAR | Status: DC
  Administered 2014-11-12 – 2014-11-16 (×6): 3 mL via INTRAVENOUS

## 2014-11-12 MED ORDER — SODIUM CHLORIDE 0.9 % IJ SOLN: 3.0000 mL | INTRAMUSCULAR | Status: DC | PRN

## 2014-11-12 MED ORDER — ACETAMINOPHEN 325 MG PO TABS
650.0000 mg | ORAL_TABLET | Freq: Once | ORAL | Status: DC
  Filled 2014-11-12: qty 2

## 2014-11-12 MED ORDER — AMLODIPINE BESYLATE 10 MG PO TABS
10.0000 mg | ORAL_TABLET | Freq: Once | ORAL | Status: AC
  Administered 2014-11-12: 01:00:00 10 mg via ORAL
  Filled 2014-11-12: qty 1

## 2014-11-12 MED ORDER — LISINOPRIL 20 MG PO TABS
20.0000 mg | ORAL_TABLET | Freq: Every day | ORAL | Status: DC
  Administered 2014-11-12 – 2014-11-16 (×5): 20 mg via ORAL
  Filled 2014-11-12 (×5): qty 1

## 2014-11-12 MED ORDER — FUROSEMIDE 10 MG/ML IJ SOLN
20.0000 mg | Freq: Once | INTRAMUSCULAR | Status: AC
  Administered 2014-11-12: 21:00:00 20 mg via INTRAVENOUS
  Filled 2014-11-12: qty 2

## 2014-11-12 MED ORDER — DIPHENHYDRAMINE HCL 25 MG PO CAPS
25.0000 mg | ORAL_CAPSULE | Freq: Once | ORAL | Status: AC
  Administered 2014-11-12: 25 mg via ORAL
  Filled 2014-11-12: qty 1

## 2014-11-12 MED ORDER — IPRATROPIUM-ALBUTEROL 0.5-2.5 (3) MG/3ML IN SOLN
3.0000 mL | RESPIRATORY_TRACT | Status: DC
  Administered 2014-11-12 – 2014-11-14 (×13): 3 mL via RESPIRATORY_TRACT
  Filled 2014-11-12 (×14): qty 3

## 2014-11-12 NOTE — Care Management (Signed)
Admitted to [facility] with the diagnosis of anemia. Lives with husband, [place] [phone]) Last seen at [facility] 2 months ago. Doesn't remember name of physician. This agency helps with medications too. No home health. Takes care of all basic and instrumental activities of daily living herself, drives. Decreased appetite x 2 weeks. No falls. Uses no aids for ambulation  Shelbie Ammons RN MSN Care Management [phone]

## 2014-11-12 NOTE — Progress Notes (Signed)
[facility] [place] NAME: Selena Bennett    MR#:  [id number]  DATE OF BIRTH:  [birth year]  SUBJECTIVE:  CHIEF COMPLAINT:   Chief Complaint  Patient presents with  . Shortness of Breath   Weakness, no melena or bloody stoo.  REVIEW OF SYSTEMS:  CONSTITUTIONAL: No fever, generalized weakness.  EYES: No blurred or double vision.  EARS, NOSE, AND THROAT: No tinnitus or ear pain.  RESPIRATORY: No cough, shortness of breath, wheezing or hemoptysis.  CARDIOVASCULAR: No chest pain, orthopnea, edema.  GASTROINTESTINAL: No nausea, vomiting, diarrhea or abdominal pain.  GENITOURINARY: No dysuria, hematuria.  ENDOCRINE: No polyuria, nocturia,  HEMATOLOGY: No anemia, easy bruising or bleeding SKIN: No rash or lesion. MUSCULOSKELETAL: No joint pain or arthritis.   NEUROLOGIC: No tingling, numbness, weakness.  PSYCHIATRY: No anxiety or depression.   DRUG ALLERGIES:  No Known Allergies  VITALS:  Blood pressure 162/78, pulse 87, temperature 98.3 F (36.8 C), temperature source Oral, resp. rate 20, height 5\' 6"  (1.676 m), weight 101.742 kg (224 lb 4.8 oz), last menstrual period 10/26/2014, SpO2 92 %.  PHYSICAL EXAMINATION:  GENERAL:  49 y.o.-year-old patient lying in the bed with no acute distress. Morbid obese. EYES: Pupils equal, round, reactive to light and accommodation. No scleral icterus. Extraocular muscles intact.  HEENT: Head atraumatic, normocephalic. Oropharynx and nasopharynx clear.  NECK:  Supple, no jugular venous distention. No thyroid enlargement, no tenderness.  LUNGS: Normal breath sounds bilaterally, no wheezing, rales,rhonchi or crepitation. No use of accessory muscles of respiration.  CARDIOVASCULAR: S1, S2 normal. No murmurs, rubs, or gallops.  ABDOMEN: Soft, tenderness on RUQ, nondistended. Bowel sounds present. No organomegaly or mass.  EXTREMITIES: No pedal edema, cyanosis, or clubbing.  NEUROLOGIC: Cranial nerves II  through XII are intact. Muscle strength 5/5 in all extremities. Sensation intact. Gait not checked.  PSYCHIATRIC: The patient is alert and oriented x 3.  SKIN: No obvious rash, lesion, or ulcer.    LABORATORY PANEL:   CBC  Recent Labs Lab 11/12/14 0757  WBC 7.3  HGB 9.3*  HCT 29.2*  PLT 120*   ------------------------------------------------------------------------------------------------------------------  Chemistries   Recent Labs Lab 11/11/14 2102  NA 140  K 3.2*  CL 109  CO2 25  GLUCOSE 139*  BUN 17  CREATININE 1.24*  CALCIUM 8.4*   ------------------------------------------------------------------------------------------------------------------  Cardiac Enzymes  Recent Labs Lab 11/11/14 2102  TROPONINI <0.03   ------------------------------------------------------------------------------------------------------------------  RADIOLOGY:  US Abdomen Limited Ruq  11/12/2014  CLINICAL DATA:  Patient with history of cirrhosis status post cholecystectomy. EXAM: US ABDOMEN LIMITED - RIGHT UPPER QUADRANT COMPARISON:  Chest CT 06/18/2004. FINDINGS: Gallbladder: Surgically absent Common bile duct: Diameter: 4 mm Liver: The liver is coarse in echogenicity. There is suggestion of multiple masses within the liver with the largest in the right hepatic lobe measuring 6.0 x 3.9 x 4.6 cm. There is a probable 2.5 x 1.5 x 2.3 cm mass in the right hepatic lobe and an additional 3.2 x 2.0 x 3.1 cm mass in the right hepatic lobe. These are of mixed echogenicity. IMPRESSION: Suggestion of multiple masses within the liver, of indeterminate etiology. This needs definitive characterization with either pre and post contrast-enhanced MRI or CT. Probable right pleural effusion. These results Selena be called to the ordering clinician or representative by the Radiologist Assistant, and communication documented in the PACS or zVision Dashboard. Electronically Signed   By: Lovey Newcomer M.D.   On:  11/12/2014 10:23  EKG:   Orders placed or performed during the hospital encounter of 11/11/14  . ED EKG within 10 minutes  . ED EKG within 10 minutes  . EKG 12-Lead  . EKG 12-Lead    ASSESSMENT AND PLAN:   1. GI bleed suspect acute on chronic slow GI bleed. History of dysphagia varices status post banding in 2015. no active bleeding. Continue IV Protonix drip twice a day. S/p 2 units of blood transfusion, Hb 9.7 today. F/u GI consultation.  2. Chronic thrombocytopenia. Stable.  3. Accelerated hypertension  Continue amlodipine and metoprolol, resume lisinopril. When necessary IV hydralazine.  4. Diabetes, type II Sliding scale insulin, hold glyburide and  metformin.   5. History of cirrhosis of liver secondary to NASH Patient has had extensive workup in 2015 at [facility]   * Hepatic masses.  Per [place], MRI of abd to rule out cancer. F/u GI. AFP and CMP.  * Hypokalemia. KCl, f/u K.   All the records are reviewed and case discussed with Care Management/Social Workerr. Management plans discussed with the patient, her husband and they are in agreement.  CODE STATUS: Full code.  TOTAL TIME TAKING CARE OF THIS PATIENT: 43 minutes.   POSSIBLE D/C IN 3 DAYS, DEPENDING ON CLINICAL CONDITION.   Demetrios Loll M.D on 11/12/2014 at 2:49 PM  Between 7am to 6pm - Pager - [phone]  After 6pm go to [email or web address] - password [email or web address] [facility]  [place] [facility]  Office  [phone]  CC: Primary care physician; No PCP Per Patient

## 2014-11-12 NOTE — Progress Notes (Signed)
Patient complains of increased SOB with wheezing. Breath sounds with rhonchi and expiratory wheezes. 02 at 2L/min with 02 sat at 92%. On call hospitalist paged.

## 2014-11-12 NOTE — Consult Note (Signed)
GI Inpatient Consult Note  Reason for Consult: Known h/o varices d/t NASH/cirrhosis   Attending Requesting Consult: Bridgett Larsson  History of Present Illness: Selena Bennett is a 49 y.o. female with a h/o of cirrhosis secondary to NASH, varices s/p banding x 2, DM, HTN, and GERD admitted for SOB and weakness with Hgb 6.9.  She presented to the [facility] ED last night with worsening SOB and weakness for about 2 weeks.  Patient states she works the third shift so she did not think much of her recent fatigue, but became concerned when SOB did not improve.  In the [facility] ED, labs revealed Hgb 6.9, MCV 76.6, Plts 111, PT 16.9, INR 1.35.  FOBT negative.  She was admitted and immediately transfused with 2 units, also started on IV Protonix 40mg  BID.  GI evaluation was requested for further management.  RUQ [place] during admission revealed multiple masses within the liver, with larges in R hepatic lobe measuring 6.0 x 3.9 x 4.6 cm.  There are two smaller masses suspected in R lobe as well. Review of records in [place] show lasst RUQ [place] in 12/14 showed cirrhosis and a central lesion measuring 3.2 x 2.1 x 1.6 cm.  A follow-up MRI in 1/15 showed cirrhosis, esophageal varices, and spenomegaly, but no suggestion of hepatoma.  Selena Bennett reports this is the third time she has experienced anemia, both previous episodes due to variceal bleeding.  Dr. Benson Norway at [facility] performed variceal banding twice, but during third EGD he did not band.  Patient notes she has not followed with him because she was unhappy with the care she received as an OP.  Today, Selena Bennett states she is experiencing bloating and LE swelling, both of which started Saturday.  She also notes mild abdominal cramping.  She denies other liver-related problems like jaundice, itching, confusion, rectal bleeding, and dark stools.  She also denies recent EtOH and tries to maintain a low-salt diet.  No fevers, chills, dysphagia, reflux, n/v, diarrhea, constipation,  hematochezia, and melena.  Past Medical History:  Past Medical History  Diagnosis Date  . Diabetes ([place])   . Hypertension   . Esophageal varices with hemorrhage ([place]) 01/30/2013  . Anemia     [facility] 02-04-13 acute blood loss  . GERD (gastroesophageal reflux disease)     Problem List: Patient Active Problem List   Diagnosis Date Noted  . Anemia 11/11/2014  . GI bleed 04/21/2013  . Azotemia 04/21/2013  . Acute renal failure ([place]) 04/21/2013  . Thrombocytopenia, unspecified ([place]) 02/05/2013  . Odynophagia 02/01/2013  . Liver lesion 02/01/2013  . HTN (hypertension) 01/30/2013  . DM (diabetes mellitus) ([place]) 01/30/2013  . Liver cirrhosis secondary to NASH (nonalcoholic steatohepatitis) 01/30/2013  . Acute blood loss anemia 01/30/2013  . Esophageal varices with hemorrhage ([place]) 01/30/2013  . GIB (gastrointestinal bleeding) 01/28/2013    Past Surgical History: Past Surgical History  Procedure Laterality Date  . Cholecystectomy open    . Esophagogastroduodenoscopy N/A 01/28/2013    Procedure: ESOPHAGOGASTRODUODENOSCOPY (EGD);  Surgeon: Beryle Beams, MD;  Location: [facility] ENDOSCOPY;  Service: Endoscopy;  Laterality: N/A;  bedside  . Esophagogastroduodenoscopy N/A 02/21/2013    Procedure: ESOPHAGOGASTRODUODENOSCOPY (EGD);  Surgeon: Beryle Beams, MD;  Location: [facility] ENDOSCOPY;  Service: Endoscopy;  Laterality: N/A;  . Esophageal banding N/A 02/21/2013    Procedure: ESOPHAGEAL BANDING;  Surgeon: Beryle Beams, MD;  Location: [facility] ENDOSCOPY;  Service: Endoscopy;  Laterality: N/A;  . C section x1  1989  . Esophagogastroduodenoscopy N/A 03/21/2013    Procedure: ESOPHAGOGASTRODUODENOSCOPY (EGD);  Surgeon: Beryle Beams, MD;  Location: [facility] ENDOSCOPY;  Service: Endoscopy;  Laterality: N/A;  . Esophageal banding N/A 03/21/2013    Procedure: ESOPHAGEAL BANDING;  Surgeon: Beryle Beams, MD;  Location: [facility] ENDOSCOPY;  Service: Endoscopy;  Laterality: N/A;  . Esophagogastroduodenoscopy N/A  04/22/2013    Procedure: ESOPHAGOGASTRODUODENOSCOPY (EGD);  Surgeon: Beryle Beams, MD;  Location: [facility] ENDOSCOPY;  Service: Endoscopy;  Laterality: N/A;  . Cholecystectomy      Allergies: No Known Allergies  Home Medications: Prescriptions prior to admission  Medication Sig Dispense Refill Last Dose  . amLODipine (NORVASC) 10 MG tablet Take 1 tablet (10 mg total) by mouth daily. 30 tablet 0 11/11/2014 at pm  . aspirin 81 MG chewable tablet Chew 81 mg by mouth daily.   11/11/2014 at Unknown time  . cyclobenzaprine (FLEXERIL) 10 MG tablet Take 10 mg by mouth 3 (three) times daily as needed for muscle spasms.   11/11/2014 at Unknown time  . glipiZIDE (GLUCOTROL XL) 10 MG 24 hr tablet Take 10 mg by mouth daily.   11/11/2014 at Unknown time  . ibuprofen (ADVIL,MOTRIN) 800 MG tablet Take 1 tablet by mouth 3 (three) times daily as needed for moderate pain.    11/11/2014 at Unknown time  . lisinopril (PRINIVIL,ZESTRIL) 20 MG tablet Take 1 tablet (20 mg total) by mouth daily. (Patient taking differently: Take 40 mg by mouth daily. ) 30 tablet 0 11/11/2014 at Unknown time  . metFORMIN (GLUMETZA) 500 MG (MOD) 24 hr tablet Take 500 mg by mouth 2 (two) times daily with a meal.   11/11/2014 at Unknown time  . metoprolol (LOPRESSOR) 50 MG tablet Take 50 mg by mouth 2 (two) times daily.   11/11/2014 at pm  . pantoprazole (PROTONIX) 40 MG tablet Take 1 tablet (40 mg total) by mouth daily. (Patient taking differently: Take 20 mg by mouth daily. ) 30 tablet 0 11/11/2014 at Unknown time  . oxyCODONE-acetaminophen (ROXICET) 5-325 MG per tablet Take 1 tablet by mouth every 6 (six) hours as needed for severe pain. (Patient not taking: Reported on 11/11/2014) 30 tablet 0 Not Taking at Unknown time   Home medication reconciliation was completed with the patient.   Scheduled Inpatient Medications:   . acetaminophen  650 mg Oral Once  . amLODipine  10 mg Oral Daily  . docusate sodium  100 mg Oral BID  . glipiZIDE  10  mg Oral Q breakfast  . insulin aspart  0-15 Units Subcutaneous TID WC  . metoprolol  50 mg Oral BID  . pantoprazole (PROTONIX) IV  40 mg Intravenous Q12H    Continuous Inpatient Infusions:   . sodium chloride 50 mL/hr at 11/12/14 0624    PRN Inpatient Medications:  cyclobenzaprine, hydrALAZINE, ondansetron **OR** ondansetron (ZOFRAN) IV, oxyCODONE  Family History: family history is not on file.    Social History:   reports that she has never smoked. She has never used smokeless tobacco. She reports that she does not drink alcohol or use illicit drugs.   Review of Systems: Constitutional: Weight is stable.  Eyes: No changes in vision. ENT: No oral lesions, sore throat.  GI: see HPI.  Heme/Lymph: No easy bruising.  CV: No chest pain.  GU: No hematuria.  Integumentary: No rashes.  Neuro: No headaches.  Psych: No depression/anxiety.  Endocrine: No heat/cold intolerance.  Allergic/Immunologic: No urticaria.  Resp: No cough; + SOB.  Musculoskeletal: + LE swelling  bilaterally.    Physical Examination: BP 158/77 mmHg  Pulse 96  Temp(Src) 97.9 F (36.6 C) (Oral)  Resp 16  Ht 5\' 6"  (1.676 m)  Wt 101.742 kg (224 lb 4.8 oz)  BMI 36.22 kg/m2  SpO2 93%  LMP 10/26/2014 (Approximate) Gen: NAD, alert and oriented x 4, family present at bedside HEENT: PEERLA, EOMI, Neck: supple, no JVD or thyromegaly Chest: CTA bilaterally, no wheezes, crackles, or other adventitious sounds CV: RRR, no m/g/c/r Abd: soft, NT, ND, +BS in all four quadrants; no HSM, guarding, ridigity, or rebound tenderness Ext: no edema, well perfused with 2+ pulses, Skin: no rash or lesions noted Lymph: no LAD  Data: Lab Results  Component Value Date   WBC 7.3 11/12/2014   HGB 9.3* 11/12/2014   HCT 29.2* 11/12/2014   MCV 79.6* 11/12/2014   PLT 120* 11/12/2014    Recent Labs Lab 11/11/14 2102 11/12/14 0757  HGB 6.9* 9.3*   Lab Results  Component Value Date   NA 140 11/11/2014   K 3.2*  11/11/2014   CL 109 11/11/2014   CO2 25 11/11/2014   BUN 17 11/11/2014   CREATININE 1.24* 11/11/2014   Lab Results  Component Value Date   ALT 17 01/18/2014   AST 36 01/18/2014   ALKPHOS 162* 01/18/2014   BILITOT 1.0 01/18/2014    Recent Labs Lab 11/11/14 2247  INR 1.35   Assessment/Plan: Ms. Magar is a 49 y.o. female  h/o of cirrhosis secondary to NASH, varices s/p banding x 2, DM, HTN, and GERD admitted for SOB and weakness with Hgb 6.9.  2 units PRBC raised Hgb to 9.3, hemodynamics stable.  HFP pending.  RUQ US showed multiple hepatic lesions in R lobe; only one, smaller lesion was present on last [place] in 2014.  I ordered an AFP to evaluate for [place].  Also recommend EGD to evaluate for re-bleeding of varices, will plan per Dr. Rayann Heman.  Will continue to follow.  Recommendations: - Await HFP, AFP - Schedule EGD per Dr. Rayann Heman - Monitor Hgb, transfuse if <7  Thank you for the consult. We will follow along with you. Please call with questions or concerns.  Lavera Guise, PA-C [facility] Phone: [phone] Pager: [phone]

## 2014-11-12 NOTE — Progress Notes (Signed)
   11/12/14 1300  Clinical Encounter Type  Visited With Patient and family together  Visit Type Initial  Referral From Nurse  Consult/Referral To Chaplain  Spiritual Encounters  Spiritual Needs Emotional  Patient's pastor was leaving room when I was entering. I spent a few minutes speaking with patient and son.  [place]

## 2014-11-12 NOTE — Plan of Care (Signed)
Problem: Discharge Progression Outcomes Goal: Other Discharge Outcomes/Goals Remains with pain in abdomen and head. Medicated with pain medications and antiemetic offering relief. NPO except ice chips for abdominal ultrasound. Tolerating ice chips well. Independent in chair and to bathroom. Returned from ultrasound and changed to clear liquid diet. Ordered upper endoscopy for tomorrow. Continues with left arm tingling and weakness, MD aware. States poor appetite.

## 2014-11-12 NOTE — Progress Notes (Signed)
TC to Dr. Benjie Karvonen with chest xray report impression read to her with Lasix IV x 1 order received. Pt in no acute distress.

## 2014-11-12 NOTE — Plan of Care (Addendum)
Problem: Discharge Progression Outcomes Goal: Barriers To Progression Addressed/Resolved Outcome: Progressing Pt is from home with family Hx of DM, HTN, esophageal varices, cirrhosis Low Fall Risk  Pt is alert and oriented, c/o abdominal pain. Oxycodone and Zofran given for nausea with improvement. 1 unit of blood running at this time. Family at bedside, supportive. Independent in room, encouraged to call for assistance. C/o weakness/numbness and tingling in left arm which has been present for a couple of weeks.

## 2014-11-13 ENCOUNTER — Inpatient Hospital Stay: Payer: Self-pay

## 2014-11-13 ENCOUNTER — Inpatient Hospital Stay
Admit: 2014-11-13 | Discharge: 2014-11-13 | Disposition: A | Discharge status: Home / Self Care | Payer: Self-pay | Attending: Internal Medicine | Admitting: Internal Medicine

## 2014-11-13 ENCOUNTER — Encounter
Admission: EM | Disposition: A | Discharge status: Home / Self Care | Payer: Self-pay | Source: Home / Self Care | Attending: Internal Medicine

## 2014-11-13 LAB — COMPREHENSIVE METABOLIC PANEL
ALK PHOS: 86 U/L (ref 38–126)
ALT: 13 U/L — ABNORMAL LOW (ref 14–54)
ANION GAP: 8 (ref 5–15)
AST: 21 U/L (ref 15–41)
Albumin: 3.5 g/dL (ref 3.5–5.0)
BUN: 13 mg/dL (ref 6–20)
CALCIUM: 8.5 mg/dL — AB (ref 8.9–10.3)
CO2: 24 mmol/L (ref 22–32)
Chloride: 110 mmol/L (ref 101–111)
Creatinine, Ser: 1.28 mg/dL — ABNORMAL HIGH (ref 0.44–1.00)
GFR, EST AFRICAN AMERICAN: 56 mL/min — AB (ref >60)
GFR, EST NON AFRICAN AMERICAN: 49 mL/min — AB (ref >60)
Glucose, Bld: 122 mg/dL — ABNORMAL HIGH (ref 65–99)
Potassium: 3 mmol/L — ABNORMAL LOW (ref 3.5–5.1)
SODIUM: 142 mmol/L (ref 135–145)
Total Bilirubin: 0.7 mg/dL (ref 0.3–1.2)
Total Protein: 7.8 g/dL (ref 6.5–8.1)

## 2014-11-13 LAB — CBC
HEMATOCRIT: 30.9 % — AB (ref 35.0–47.0)
Hemoglobin: 10 g/dL — ABNORMAL LOW (ref 12.0–16.0)
MCH: 25.3 pg — ABNORMAL LOW (ref 26.0–34.0)
MCHC: 32.3 g/dL (ref 32.0–36.0)
MCV: 78.3 fL — ABNORMAL LOW (ref 80.0–100.0)
Platelets: 127 10*3/uL — ABNORMAL LOW (ref 150–440)
RBC: 3.95 MIL/uL (ref 3.80–5.20)
RDW: 16.7 % — AB (ref 11.5–14.5)
WBC: 8.1 10*3/uL (ref 3.6–11.0)

## 2014-11-13 LAB — TYPE AND SCREEN
ABO/RH(D): A POS
ANTIBODY SCREEN: NEGATIVE
UNIT DIVISION: 0
Unit division: 0

## 2014-11-13 LAB — AFP TUMOR MARKER: AFP TUMOR MARKER: 2.7 ng/mL (ref 0.0–8.3)

## 2014-11-13 LAB — GLUCOSE, CAPILLARY
GLUCOSE-CAPILLARY: 130 mg/dL — AB (ref 65–99)
Glucose-Capillary: 140 mg/dL — ABNORMAL HIGH (ref 65–99)
Glucose-Capillary: 129 mg/dL — ABNORMAL HIGH (ref 65–99)
Glucose-Capillary: 285 mg/dL — ABNORMAL HIGH (ref 65–99)
Glucose-Capillary: 120 mg/dL — ABNORMAL HIGH (ref 65–99)

## 2014-11-13 LAB — MAGNESIUM: MAGNESIUM: 1.4 mg/dL — AB (ref 1.7–2.4)

## 2014-11-13 SURGERY — ESOPHAGOGASTRODUODENOSCOPY (EGD) WITH PROPOFOL
Anesthesia: General

## 2014-11-13 MED ORDER — MORPHINE SULFATE (PF) 2 MG/ML IV SOLN
2.0000 mg | Freq: Once | INTRAVENOUS | Status: AC
  Administered 2014-11-13: 07:00:00 2 mg via INTRAVENOUS
  Filled 2014-11-13: qty 1

## 2014-11-13 MED ORDER — MAGNESIUM CHLORIDE 64 MG PO TBEC
2.0000 | DELAYED_RELEASE_TABLET | Freq: Every day | ORAL | Status: DC
  Filled 2014-11-13: qty 2

## 2014-11-13 MED ORDER — FUROSEMIDE 20 MG PO TABS
20.0000 mg | ORAL_TABLET | Freq: Two times a day (BID) | ORAL | Status: DC
  Administered 2014-11-13 – 2014-11-16 (×6): 20 mg via ORAL
  Filled 2014-11-13 (×6): qty 1

## 2014-11-13 MED ORDER — FUROSEMIDE 10 MG/ML IJ SOLN
20.0000 mg | Freq: Two times a day (BID) | INTRAMUSCULAR | Status: DC
  Administered 2014-11-13: 20 mg via INTRAVENOUS
  Filled 2014-11-13: qty 2

## 2014-11-13 MED ORDER — FUROSEMIDE 20 MG PO TABS: 20.0000 mg | ORAL_TABLET | Freq: Every day | ORAL | Status: DC

## 2014-11-13 MED ORDER — MAGNESIUM LACTATE 84 MG (7MEQ) PO TBCR
84.0000 mg | EXTENDED_RELEASE_TABLET | Freq: Every day | ORAL | Status: DC
  Administered 2014-11-13 – 2014-11-15 (×3): 84 mg via ORAL
  Filled 2014-11-13 (×4): qty 1

## 2014-11-13 MED ORDER — MAGNESIUM SULFATE 2 GM/50ML IV SOLN
2.0000 g | Freq: Once | INTRAVENOUS | Status: DC
  Filled 2014-11-13: qty 50

## 2014-11-13 MED ORDER — ONDANSETRON HCL 4 MG/2ML IJ SOLN
4.0000 mg | Freq: Once | INTRAMUSCULAR | Status: AC
  Administered 2014-11-13: 07:00:00 4 mg via INTRAVENOUS
  Filled 2014-11-13: qty 2

## 2014-11-13 MED ORDER — POTASSIUM CHLORIDE CRYS ER 20 MEQ PO TBCR
40.0000 meq | EXTENDED_RELEASE_TABLET | Freq: Once | ORAL | Status: AC
  Administered 2014-11-13: 12:00:00 40 meq via ORAL
  Filled 2014-11-13: qty 2

## 2014-11-13 MED ORDER — PANTOPRAZOLE SODIUM 40 MG PO TBEC
40.0000 mg | DELAYED_RELEASE_TABLET | Freq: Every day | ORAL | Status: DC
  Administered 2014-11-13 – 2014-11-14 (×2): 40 mg via ORAL
  Filled 2014-11-13 (×2): qty 1

## 2014-11-13 NOTE — Plan of Care (Signed)
Problem: Discharge Progression Outcomes Goal: Barriers To Progression Addressed/Resolved Outcome: Progressing Hgb improving. 10 this am. Goal: Other Discharge Outcomes/Goals Outcome: Progressing Given Oxycodone once for pain with not much improvement. Pt declined further interventions. Zofran given once for nausea with improvement. Hemodynamics: Pt came in with hgb of 6.9. Given 2 units of prbcs since admission. This am hgb 10.0. NPO for upper endoscopy today. Up to bsc self.

## 2014-11-13 NOTE — Progress Notes (Signed)
*  PRELIMINARY RESULTS* Echocardiogram 2D Echocardiogram has been performed.  Selena Bennett 11/13/2014, 2:06 PM

## 2014-11-13 NOTE — Progress Notes (Signed)
Initial Nutrition Assessment   INTERVENTION:   Meals and Snacks: Cater to patient preferences on 2g Na diet order. Pt may benefit from Carb modified diet order as well.   NUTRITION DIAGNOSIS:   No nutrition diagnosis at this time  GOAL:   Patient will meet greater than or equal to 90% of their needs  MONITOR:    (Energy Intake, Electrolyte and Renal Profile, Anthropometrics, Digestive system)  REASON FOR ASSESSMENT:   Diagnosis    ASSESSMENT:   Pt admitted with SOB, high BP, cirrhosis secondary to NASH, and anemia. Pt s/p ECHO this am.  Past Medical History  Diagnosis Date  . Diabetes ([place])   . Hypertension   . Esophageal varices with hemorrhage ([place]) 01/30/2013  . Anemia     [facility] 02-04-13 acute blood loss  . GERD (gastroesophageal reflux disease)     Diet Order:  Diet 2 gram sodium Room service appropriate?: Yes; Fluid consistency:: Thin    Current Nutrition: Pt had just eaten 50% of lunch tray on visit. NPO this am.   Food/Nutrition-Related History: Pt reports not eating as well the past few days PTA.   Medications: Lasix, MgS, PRotonix, colace, 19mL NS injection q12 hours  Electrolyte/Renal Profile and Glucose Profile:   Recent Labs Lab 11/11/14 2102 11/13/14 0458  NA 140 142  K 3.2* 3.0*  CL 109 110  CO2 25 24  BUN 17 13  CREATININE 1.24* 1.28*  CALCIUM 8.4* 8.5*  MG  --  1.4*  GLUCOSE 139* 122*   Protein Profile:  Recent Labs Lab 11/13/14 0458  ALBUMIN 3.5    Gastrointestinal Profile: Last BM:  11/11/2014   Weight Change: Per [facility] weight gain PTA   Skin:  Reviewed, no issues  Height:   Ht Readings from Last 1 Encounters:  11/11/14 5\' 6"  (1.676 m)    Weight:   Wt Readings from Last 1 Encounters:  11/11/14 224 lb 4.8 oz (101.742 kg)     Wt Readings from Last 10 Encounters:  11/11/14 224 lb 4.8 oz (101.742 kg)  04/22/13 202 lb 12.8 oz (91.989 kg)  03/21/13 196 lb (88.905 kg)  02/01/13 203 lb 7.8 oz (92.3 kg)      BMI:  Body mass index is 36.22 kg/(m^2).   LOW Care Level  Dwyane Luo, [place], [place] Pager [phone]

## 2014-11-13 NOTE — Progress Notes (Signed)
[facility] [place] NAME: Selena Bennett    MR#:  [id number]  DATE OF BIRTH:  1965/09/05  SUBJECTIVE:  CHIEF COMPLAINT:   Chief Complaint  Patient presents with  . Shortness of Breath   SOB and weakness, no melena or bloody stool. On O2 [place] 3 L. REVIEW OF SYSTEMS:  CONSTITUTIONAL: No fever, generalized weakness.  EYES: No blurred or double vision.  EARS, NOSE, AND THROAT: No tinnitus or ear pain.  RESPIRATORY: No cough, shortness of breath, wheezing or hemoptysis.  CARDIOVASCULAR: No chest pain, orthopnea, edema.  GASTROINTESTINAL: No nausea, vomiting, diarrhea or abdominal pain.  GENITOURINARY: No dysuria, hematuria.  ENDOCRINE: No polyuria, nocturia,  HEMATOLOGY: No anemia, easy bruising or bleeding SKIN: No rash or lesion. MUSCULOSKELETAL: No joint pain or arthritis.   NEUROLOGIC: No tingling, numbness, weakness.  PSYCHIATRY: Has anxiety for liver mass, no depression.   DRUG ALLERGIES:  No Known Allergies  VITALS:  Blood pressure 168/84, pulse 100, temperature 98.6 F (37 C), temperature source Oral, resp. rate 18, height 5\' 6"  (1.676 m), weight 101.742 kg (224 lb 4.8 oz), last menstrual period 10/26/2014, SpO2 94 %.  PHYSICAL EXAMINATION:  GENERAL:  49 y.o.-year-old patient lying in the bed with no acute distress. Morbid obese. EYES: Pupils equal, round, reactive to light and accommodation. No scleral icterus. Extraocular muscles intact.  HEENT: Head atraumatic, normocephalic. Oropharynx and nasopharynx clear.  NECK:  Supple, no jugular venous distention. No thyroid enlargement, no tenderness.  LUNGS: Normal breath sounds bilaterally, no wheezing, rales,rhonchi or crepitation. No use of accessory muscles of respiration.  CARDIOVASCULAR: S1, S2 normal. No murmurs, rubs, or gallops.  ABDOMEN: Soft, tenderness on RUQ, nondistended. Bowel sounds present. No organomegaly or mass.  EXTREMITIES: No pedal edema, cyanosis, or  clubbing.  NEUROLOGIC: Cranial nerves II through XII are intact. Muscle strength 5/5 in all extremities. Sensation intact. Gait not checked.  PSYCHIATRIC: The patient is alert and oriented x 3.  SKIN: No obvious rash, lesion, or ulcer.    LABORATORY PANEL:   CBC  Recent Labs Lab 11/13/14 0458  WBC 8.1  HGB 10.0*  HCT 30.9*  PLT 127*   ------------------------------------------------------------------------------------------------------------------  Chemistries   Recent Labs Lab 11/13/14 0458  NA 142  K 3.0*  CL 110  CO2 24  GLUCOSE 122*  BUN 13  CREATININE 1.28*  CALCIUM 8.5*  MG 1.4*  AST 21  ALT 13*  ALKPHOS 86  BILITOT 0.7   ------------------------------------------------------------------------------------------------------------------  Cardiac Enzymes  Recent Labs Lab 11/11/14 2102  TROPONINI <0.03   ------------------------------------------------------------------------------------------------------------------  RADIOLOGY:  Dg Chest 1 View  11/12/2014  CLINICAL DATA:  Sudden onset of shortness of breath. EXAM: CHEST 1 VIEW COMPARISON:  04/21/2013 FINDINGS: Cardiomediastinal silhouette is enlarged. Mediastinal contours appear intact. There is no evidence of focal airspace consolidation, or pneumothorax. There is diffuse increase of the interstitial markings. There may be small bilateral pleural effusions. Lung volumes are low. Osseous structures are without acute abnormality. Soft tissues are grossly normal. IMPRESSION: Low lung volumes with diffuse increase of the interstitial markings, suggestive of pulmonary vascular congestion. Enlarged cardiac silhouette. Electronically Signed   By: Fidela Salisbury M.D.   On: 11/12/2014 19:23   Mr Abdomen Wo Contrast  11/13/2014  CLINICAL DATA:  Nonalcoholic steatohepatitis and cirrhosis. Multiple liver masses question on abdominal sonogram from 1 day prior. Status post banding of esophageal varices x2. Type 2  diabetes mellitus. Patient admitted with abdominal distention, weakness and shortness of breath.  EXAM: MRI ABDOMEN WITHOUT CONTRAST TECHNIQUE: Multiplanar multisequence MR imaging was performed without the administration of intravenous contrast. COMPARISON:  11/12/2014 abdominal sonogram. FINDINGS: Images are partially motion degraded. Lower chest: Small layering bilateral pleural effusions, right greater than left. Associated passive atelectasis in the lower lobes. Mild cardiomegaly. Hepatobiliary: There is mild relative hypertrophy of the left liver lobe and there is a suggestion of subtle liver surface irregularity, suggesting cirrhosis. No evidence of hepatic steatosis on chemical shift imaging. No liver mass is apparent on this noncontrast study. Status post cholecystectomy. No intrahepatic biliary ductal dilatation. Common bile duct diameter 6 mm, within normal post cholecystectomy limits. No choledocholithiasis. Pancreas: No pancreatic mass or duct dilation.  No pancreas divisum. Spleen: Mild splenomegaly (craniocaudal splenic length 13.0 cm). No mass. Adrenals/Urinary Tract: Normal adrenals. No hydronephrosis. Normal kidneys with no renal mass. Stomach/Bowel: Grossly normal stomach. Visualized small and large bowel is normal caliber, with no bowel wall thickening. Mild scattered colonic diverticulosis. Vascular/Lymphatic: Normal caliber abdominal aorta. Small to moderate paraumbilical varices. Small gastroesophageal varices. No pathologically enlarged lymph nodes in the abdomen. Other: Trace ascites, predominantly in the deep pelvis as seen on the coronal sequence. Musculoskeletal: No aggressive appearing focal osseous lesions. IMPRESSION: 1. Mild relative hypertrophy of the left liver lobe with subtle liver surface irregularity, suggesting cirrhosis. 2. Although no liver mass is apparent, a liver mass cannot be excluded on the basis of this motion degraded noncontrast MRI study. Given the patient's limited  ability to breath hold, a triphasic liver protocol CT of the abdomen with and without intravenous contrast is advised. 3. Evidence of portal hypertension, including mild splenomegaly, trace ascites and paraumbilical and gastroesophageal varices. 4. Small bilateral pleural effusions. 5. Status post cholecystectomy.  No biliary ductal dilatation. 6. Mild scattered colonic diverticulosis. Electronically Signed   By: Ilona Sorrel M.D.   On: 11/13/2014 11:37   US Abdomen Limited Ruq  11/12/2014  CLINICAL DATA:  Patient with history of cirrhosis status post cholecystectomy. EXAM: US ABDOMEN LIMITED - RIGHT UPPER QUADRANT COMPARISON:  Chest CT 06/18/2004. FINDINGS: Gallbladder: Surgically absent Common bile duct: Diameter: 4 mm Liver: The liver is coarse in echogenicity. There is suggestion of multiple masses within the liver with the largest in the right hepatic lobe measuring 6.0 x 3.9 x 4.6 cm. There is a probable 2.5 x 1.5 x 2.3 cm mass in the right hepatic lobe and an additional 3.2 x 2.0 x 3.1 cm mass in the right hepatic lobe. These are of mixed echogenicity. IMPRESSION: Suggestion of multiple masses within the liver, of indeterminate etiology. This needs definitive characterization with either pre and post contrast-enhanced MRI or CT. Probable right pleural effusion. These results will be called to the ordering clinician or representative by the Radiologist Assistant, and communication documented in the PACS or zVision Dashboard. Electronically Signed   By: Lovey Newcomer M.D.   On: 11/12/2014 10:23    EKG:   Orders placed or performed during the hospital encounter of 11/11/14  . ED EKG within 10 minutes  . ED EKG within 10 minutes  . EKG 12-Lead  . EKG 12-Lead    ASSESSMENT AND PLAN:   1. GI bleed,  suspected acute on chronic slow GI bleed. History of dysphagia varices status post banding in 2015. no active bleeding. Continue IV Protonix iv twice a day. S/p 2 units of blood transfusion, Hb 10  today. F/u GI for EGD. EGD was canceled due to SOB on O2 [place].  2. Chronic thrombocytopenia. Stable.  3. Accelerated hypertension  Continue amlodipine and metoprolol, resume lisinopril. When necessary IV hydralazine.  4. Diabetes, type II Sliding scale insulin, hold glyburide and  metformin.   5. History of cirrhosis of liver secondary to NASH Patient has had extensive workup in 2015 at [facility]   * Hepatic masses per [place],  Per MRI of abd: Although no liver mass is apparent, a liver mass cannot be excluded.  Triphasic CT recommended. F/u GI. AFP.  * Hypokalemia. KCl, f/u K.  * Hypomagnesemia. Mag, iv and f/u level.  * Pulmonary edema with hypoxia. Continue O2 [place], neb, start lasix and get echo.  All the records are reviewed and case discussed with Care Management/Social Workerr. Management plans discussed with the patient, her husband and they are in agreement.  CODE STATUS: Full code.  TOTAL TIME TAKING CARE OF THIS PATIENT: 45 minutes.   POSSIBLE D/C IN 3 DAYS, DEPENDING ON CLINICAL CONDITION.   Demetrios Loll M.D on 11/13/2014 at 12:03 PM  Between 7am to 6pm - Pager - [phone]  After 6pm go to [email or web address] - password [email or web address] [facility]  [facility]  Office  [phone]  CC: Primary care physician; No PCP Per Patient

## 2014-11-13 NOTE — Plan of Care (Addendum)
Problem: Discharge Progression Outcomes Goal: Other Discharge Outcomes/Goals Outcome: Progressing O2 sats 56% on room air in am during report. Pt was asleep, easily aroused asymptomatic. 4L O2 applied O2 sats>92%. MD aware. Pt desat again to 68% afternoon while back from the bathroom without O2. 3L O2 applied, O2 sats back up to >92%. MD aware. VSS. Denies pain. Pt refused an IV insertion until around 1500. Staff unable to get an IV access, MD notified. IV meds changed to PO. EGD not done today due to breathing issues per GI physician. EGD possible on Monday. No signs of bleeding. Hgb this am 10.

## 2014-11-14 LAB — GLUCOSE, CAPILLARY
GLUCOSE-CAPILLARY: 189 mg/dL — AB (ref 65–99)
GLUCOSE-CAPILLARY: 216 mg/dL — AB (ref 65–99)
GLUCOSE-CAPILLARY: 242 mg/dL — AB (ref 65–99)
Glucose-Capillary: 209 mg/dL — ABNORMAL HIGH (ref 65–99)

## 2014-11-14 LAB — BASIC METABOLIC PANEL
Anion gap: 7 (ref 5–15)
BUN: 16 mg/dL (ref 6–20)
CALCIUM: 7.9 mg/dL — AB (ref 8.9–10.3)
CHLORIDE: 105 mmol/L (ref 101–111)
CO2: 27 mmol/L (ref 22–32)
CREATININE: 1.25 mg/dL — AB (ref 0.44–1.00)
GFR calc non Af Amer: 50 mL/min — ABNORMAL LOW (ref >60)
GFR, EST AFRICAN AMERICAN: 58 mL/min — AB (ref >60)
GLUCOSE: 205 mg/dL — AB (ref 65–99)
Potassium: 2.9 mmol/L — CL (ref 3.5–5.1)
Sodium: 139 mmol/L (ref 135–145)

## 2014-11-14 LAB — MAGNESIUM: Magnesium: 1.4 mg/dL — ABNORMAL LOW (ref 1.7–2.4)

## 2014-11-14 MED ORDER — POTASSIUM CHLORIDE CRYS ER 20 MEQ PO TBCR
40.0000 meq | EXTENDED_RELEASE_TABLET | Freq: Two times a day (BID) | ORAL | Status: AC
  Administered 2014-11-14 – 2014-11-15 (×3): 40 meq via ORAL
  Filled 2014-11-14 (×3): qty 2

## 2014-11-14 MED ORDER — PANTOPRAZOLE SODIUM 40 MG PO TBEC
40.0000 mg | DELAYED_RELEASE_TABLET | Freq: Two times a day (BID) | ORAL | Status: DC
  Administered 2014-11-14 – 2014-11-15 (×3): 40 mg via ORAL
  Filled 2014-11-14 (×3): qty 1

## 2014-11-14 MED ORDER — IPRATROPIUM-ALBUTEROL 0.5-2.5 (3) MG/3ML IN SOLN
3.0000 mL | Freq: Four times a day (QID) | RESPIRATORY_TRACT | Status: DC
  Administered 2014-11-15 – 2014-11-16 (×5): 3 mL via RESPIRATORY_TRACT
  Filled 2014-11-14 (×4): qty 3

## 2014-11-14 MED ORDER — POTASSIUM CHLORIDE CRYS ER 20 MEQ PO TBCR
20.0000 meq | EXTENDED_RELEASE_TABLET | Freq: Once | ORAL | Status: AC
  Administered 2014-11-14: 07:00:00 20 meq via ORAL
  Filled 2014-11-14 (×2): qty 1

## 2014-11-14 MED ORDER — IPRATROPIUM-ALBUTEROL 0.5-2.5 (3) MG/3ML IN SOLN: 3.0000 mL | RESPIRATORY_TRACT | Status: DC | PRN

## 2014-11-14 MED ORDER — MAGNESIUM SULFATE 4 GM/100ML IV SOLN
4.0000 g | Freq: Once | INTRAVENOUS | Status: DC
  Filled 2014-11-14: qty 100

## 2014-11-14 MED ORDER — MAGNESIUM OXIDE 400 (241.3 MG) MG PO TABS
400.0000 mg | ORAL_TABLET | ORAL | Status: AC | PRN
  Administered 2014-11-14 (×2): 400 mg via ORAL
  Filled 2014-11-14 (×2): qty 1

## 2014-11-14 NOTE — Plan of Care (Addendum)
Problem: Discharge Progression Outcomes Goal: Other Discharge Outcomes/Goals Plan of care progress to goal: - Complained of pain, oxycodone given with improvement. - Zofran PO given for nausea with improvement. - Ambulates in room. - Critical potassium this am, MD notified, 23mEq PO  Ordered per MD. - Husband at bedside. Will continue to monitor.

## 2014-11-14 NOTE — Progress Notes (Signed)
[facility] [place] NAME: Selena Bennett    MR#:  [id number]  DATE OF BIRTH:  1965/12/02  SUBJECTIVE:  CHIEF COMPLAINT:   Chief Complaint  Patient presents with  . Shortness of Breath   Better SOB and weakness, no melena or bloody stool. On O2  2 L. REVIEW OF SYSTEMS:  CONSTITUTIONAL: No fever, generalized weakness.  EYES: No blurred or double vision.  EARS, NOSE, AND THROAT: No tinnitus or ear pain.  RESPIRATORY: No cough, shortness of breath, wheezing or hemoptysis.  CARDIOVASCULAR: No chest pain, orthopnea, edema.  GASTROINTESTINAL: No nausea, vomiting, diarrhea or abdominal pain.  GENITOURINARY: No dysuria, hematuria.  ENDOCRINE: No polyuria, nocturia,  HEMATOLOGY: No anemia, easy bruising or bleeding SKIN: No rash or lesion. MUSCULOSKELETAL: No joint pain or arthritis.   NEUROLOGIC: No tingling, numbness, weakness.  PSYCHIATRY: No anxiety or depression.   DRUG ALLERGIES:  No Known Allergies  VITALS:  Blood pressure 161/79, pulse 89, temperature 98.5 F (36.9 C), temperature source Oral, resp. rate 18, height 5\' 6"  (1.676 m), weight 101.742 kg (224 lb 4.8 oz), last menstrual period 10/26/2014, SpO2 91 %.  PHYSICAL EXAMINATION:  GENERAL:  49 y.o.-year-old patient lying in the bed with no acute distress. Morbid obese. EYES: Pupils equal, round, reactive to light and accommodation. No scleral icterus. Extraocular muscles intact.  HEENT: Head atraumatic, normocephalic. Oropharynx and nasopharynx clear.  NECK:  Supple, no jugular venous distention. No thyroid enlargement, no tenderness.  LUNGS: Normal breath sounds bilaterally, no wheezing, rales,rhonchi or crepitation. No use of accessory muscles of respiration.  CARDIOVASCULAR: S1, S2 normal. No murmurs, rubs, or gallops.  ABDOMEN: Soft, tenderness on RUQ, nondistended. Bowel sounds present. No organomegaly or mass.  EXTREMITIES: No pedal edema, cyanosis, or clubbing.   NEUROLOGIC: Cranial nerves II through XII are intact. Muscle strength 5/5 in all extremities. Sensation intact. Gait not checked.  PSYCHIATRIC: The patient is alert and oriented x 3.  SKIN: No obvious rash, lesion, or ulcer.    LABORATORY PANEL:   CBC  Recent Labs Lab 11/13/14 0458  WBC 8.1  HGB 10.0*  HCT 30.9*  PLT 127*   ------------------------------------------------------------------------------------------------------------------  Chemistries   Recent Labs Lab 11/13/14 0458 11/14/14 0531  NA 142 139  K 3.0* 2.9*  CL 110 105  CO2 24 27  GLUCOSE 122* 205*  BUN 13 16  CREATININE 1.28* 1.25*  CALCIUM 8.5* 7.9*  MG 1.4* 1.4*  AST 21  --   ALT 13*  --   ALKPHOS 86  --   BILITOT 0.7  --    ------------------------------------------------------------------------------------------------------------------  Cardiac Enzymes  Recent Labs Lab 11/11/14 2102  TROPONINI <0.03   ------------------------------------------------------------------------------------------------------------------  RADIOLOGY:  Dg Chest 1 View  11/12/2014  CLINICAL DATA:  Sudden onset of shortness of breath. EXAM: CHEST 1 VIEW COMPARISON:  04/21/2013 FINDINGS: Cardiomediastinal silhouette is enlarged. Mediastinal contours appear intact. There is no evidence of focal airspace consolidation, or pneumothorax. There is diffuse increase of the interstitial markings. There may be small bilateral pleural effusions. Lung volumes are low. Osseous structures are without acute abnormality. Soft tissues are grossly normal. IMPRESSION: Low lung volumes with diffuse increase of the interstitial markings, suggestive of pulmonary vascular congestion. Enlarged cardiac silhouette. Electronically Signed   By: Fidela Salisbury M.D.   On: 11/12/2014 19:23   Mr Abdomen Wo Contrast  11/13/2014  CLINICAL DATA:  Nonalcoholic steatohepatitis and cirrhosis. Multiple liver masses question on abdominal sonogram from 1  day prior.  Status post banding of esophageal varices x2. Type 2 diabetes mellitus. Patient admitted with abdominal distention, weakness and shortness of breath. EXAM: MRI ABDOMEN WITHOUT CONTRAST TECHNIQUE: Multiplanar multisequence MR imaging was performed without the administration of intravenous contrast. COMPARISON:  11/12/2014 abdominal sonogram. FINDINGS: Images are partially motion degraded. Lower chest: Small layering bilateral pleural effusions, right greater than left. Associated passive atelectasis in the lower lobes. Mild cardiomegaly. Hepatobiliary: There is mild relative hypertrophy of the left liver lobe and there is a suggestion of subtle liver surface irregularity, suggesting cirrhosis. No evidence of hepatic steatosis on chemical shift imaging. No liver mass is apparent on this noncontrast study. Status post cholecystectomy. No intrahepatic biliary ductal dilatation. Common bile duct diameter 6 mm, within normal post cholecystectomy limits. No choledocholithiasis. Pancreas: No pancreatic mass or duct dilation.  No pancreas divisum. Spleen: Mild splenomegaly (craniocaudal splenic length 13.0 cm). No mass. Adrenals/Urinary Tract: Normal adrenals. No hydronephrosis. Normal kidneys with no renal mass. Stomach/Bowel: Grossly normal stomach. Visualized small and large bowel is normal caliber, with no bowel wall thickening. Mild scattered colonic diverticulosis. Vascular/Lymphatic: Normal caliber abdominal aorta. Small to moderate paraumbilical varices. Small gastroesophageal varices. No pathologically enlarged lymph nodes in the abdomen. Other: Trace ascites, predominantly in the deep pelvis as seen on the coronal sequence. Musculoskeletal: No aggressive appearing focal osseous lesions. IMPRESSION: 1. Mild relative hypertrophy of the left liver lobe with subtle liver surface irregularity, suggesting cirrhosis. 2. Although no liver mass is apparent, a liver mass cannot be excluded on the basis of this  motion degraded noncontrast MRI study. Given the patient's limited ability to breath hold, a triphasic liver protocol CT of the abdomen with and without intravenous contrast is advised. 3. Evidence of portal hypertension, including mild splenomegaly, trace ascites and paraumbilical and gastroesophageal varices. 4. Small bilateral pleural effusions. 5. Status post cholecystectomy.  No biliary ductal dilatation. 6. Mild scattered colonic diverticulosis. Electronically Signed   By: Ilona Sorrel M.D.   On: 11/13/2014 11:37    EKG:   Orders placed or performed during the hospital encounter of 11/11/14  . ED EKG within 10 minutes  . ED EKG within 10 minutes  . EKG 12-Lead  . EKG 12-Lead    ASSESSMENT AND PLAN:   1. GI bleed,  suspected acute on chronic slow GI bleed. History of dysphagia varices status post banding in 2015. no active bleeding. Continue po Protonix iv twice a day. S/p 2 units of blood transfusion, Hb is stable. F/u GI for EGD. EGD was canceled due to SOB on O2 [place].  2. Chronic thrombocytopenia. Stable.  3. Accelerated hypertension  Continue amlodipine and metoprolol and lisinopril. When necessary IV hydralazine.  4. Diabetes, type II Sliding scale insulin, hold glyburide and  metformin.   5. History of cirrhosis of liver secondary to NASH Patient has had extensive workup in 2015 at [facility]   * Hepatic masses per [place],  Per MRI of abd: Although no liver mass is apparent, a liver mass cannot be excluded.  Triphasic CT recommended. F/u GI. Normal AFP.  * Hypokalemia. KCl supplement and follow-up BMP.  * Hypomagnesemia. Mag supplement and a follow-up level.  * Pulmonary edema with hypoxia. Try to wean off O2 , neb, continue and follow-up echo report.  All the records are reviewed and case discussed with Care Management/Social Workerr. Management plans discussed with the patient, her husband and they are in agreement.  CODE STATUS: Full code.  TOTAL TIME TAKING CARE  OF THIS PATIENT: 21  minutes.   POSSIBLE D/C IN 2-3 DAYS, DEPENDING ON CLINICAL CONDITION.   Demetrios Loll M.D on 11/14/2014 at 12:46 PM  Between 7am to 6pm - Pager - [phone]  After 6pm go to [email or web address] - [email or web address] [facility]  [place] [facility]  Office  [phone]  CC: Primary care physician; No PCP Per Patient

## 2014-11-14 NOTE — Progress Notes (Signed)
GI Inpatient Follow-up Note  Patient Identification: Selena Bennett is a 49 y.o. female with ETOH cirrhosis c/b varices a/w anemia SOB  Subjective:  EGD cancelled by anesthesia today due to hypoxia and pulm edema on cxr.  No bleeding, no melena. Hgb stable.  + Still some SOB.   No n/v.   Scheduled Inpatient Medications:  . acetaminophen  650 mg Oral Once  . amLODipine  10 mg Oral Daily  . docusate sodium  100 mg Oral BID  . furosemide  20 mg Oral BID  . insulin aspart  0-15 Units Subcutaneous TID WC  . ipratropium-albuterol  3 mL Nebulization Q4H  . lisinopril  20 mg Oral Daily  . magnesium  84 mg Oral Daily  . magnesium sulfate 1 - 4 g bolus IVPB  4 g Intravenous Once  . metoprolol  50 mg Oral BID  . pantoprazole  40 mg Oral Daily  . potassium chloride  40 mEq Oral BID  . sodium chloride  3 mL Intravenous Q12H    Continuous Inpatient Infusions:     PRN Inpatient Medications:  cyclobenzaprine, hydrALAZINE, LORazepam, ondansetron **OR** ondansetron (ZOFRAN) IV, oxyCODONE, sodium chloride     Physical Examination: BP 156/85 mmHg  Pulse 94  Temp(Src) 98.5 F (36.9 C) (Oral)  Resp 18  Ht 5\' 6"  (1.676 m)  Wt 101.742 kg (224 lb 4.8 oz)  BMI 36.22 kg/m2  SpO2 97%  LMP 10/26/2014 (Approximate) Gen: NAD, alert and oriented x 4 HEENT:  [place], + nasal canula Neck: supple, no JVD or thyromegaly Chest: CTA bilaterally, mild crackles at bases bilat CV: RRR, no m/g/c/r Abd: soft, NT, ND, +BS in all four quadrants; no HSM, guarding, ridigity, or rebound tenderness, + obese abd Ext: no edema, well perfused with 2+ pulses, Skin: no rash or lesions noted Lymph: no LAD  Data: Lab Results  Component Value Date   WBC 8.1 11/13/2014   HGB 10.0* 11/13/2014   HCT 30.9* 11/13/2014   MCV 78.3* 11/13/2014   PLT 127* 11/13/2014    Recent Labs Lab 11/11/14 2102 11/12/14 0757 11/13/14 0458  HGB 6.9* 9.3* 10.0*   Lab Results  Component Value Date   NA 139 11/14/2014   K 2.9*  11/14/2014   CL 105 11/14/2014   CO2 27 11/14/2014   BUN 16 11/14/2014   CREATININE 1.25* 11/14/2014   Lab Results  Component Value Date   ALT 13* 11/13/2014   AST 21 11/13/2014   ALKPHOS 86 11/13/2014   BILITOT 0.7 11/13/2014    Recent Labs Lab 11/11/14 2247  INR 1.35   Assessment/Plan: [name] is a 49 y.o. female with ETOH cirrhosis c/b varices a/w anemia SOB.  Hgb stable and no bleeding. EGD postponed from today by anesthesia due to hypoxia, pulm edema on CXR  Recommendations: - f/u Hgb - cont protonix - EGD on Monday if hypoxia and pulml edema improved and stable for anesthesia.   Please call with questions or concerns.  Adely Facer, Grace Blight, MD

## 2014-11-14 NOTE — Plan of Care (Signed)
Problem: Discharge Progression Outcomes Goal: Other Discharge Outcomes/Goals Outcome: Progressing VSS. O2 sats 93% on 2L O2. Denies pain. Serum Magnesium 1.4, Magnesium PO given. Potassium 2.9, potassium PO given. Lasix given per MD order. No signs of bleeding.

## 2014-11-15 LAB — BASIC METABOLIC PANEL
ANION GAP: 6 (ref 5–15)
BUN: 14 mg/dL (ref 6–20)
CALCIUM: 8.1 mg/dL — AB (ref 8.9–10.3)
CO2: 29 mmol/L (ref 22–32)
Chloride: 104 mmol/L (ref 101–111)
Creatinine, Ser: 1.25 mg/dL — ABNORMAL HIGH (ref 0.44–1.00)
GFR calc Af Amer: 58 mL/min — ABNORMAL LOW (ref >60)
GFR, EST NON AFRICAN AMERICAN: 50 mL/min — AB (ref >60)
GLUCOSE: 185 mg/dL — AB (ref 65–99)
Potassium: 3.1 mmol/L — ABNORMAL LOW (ref 3.5–5.1)
Sodium: 139 mmol/L (ref 135–145)

## 2014-11-15 LAB — GLUCOSE, CAPILLARY
GLUCOSE-CAPILLARY: 262 mg/dL — AB (ref 65–99)
GLUCOSE-CAPILLARY: 243 mg/dL — AB (ref 65–99)
Glucose-Capillary: 195 mg/dL — ABNORMAL HIGH (ref 65–99)
Glucose-Capillary: 226 mg/dL — ABNORMAL HIGH (ref 65–99)

## 2014-11-15 LAB — MAGNESIUM: Magnesium: 1.4 mg/dL — ABNORMAL LOW (ref 1.7–2.4)

## 2014-11-15 MED ORDER — ONDANSETRON HCL 4 MG PO TABS
4.0000 mg | ORAL_TABLET | ORAL | Status: DC | PRN
  Administered 2014-11-15: 4 mg via ORAL
  Filled 2014-11-15 (×2): qty 1

## 2014-11-15 MED ORDER — POTASSIUM CHLORIDE CRYS ER 20 MEQ PO TBCR
40.0000 meq | EXTENDED_RELEASE_TABLET | Freq: Two times a day (BID) | ORAL | Status: AC
  Administered 2014-11-15: 21:00:00 40 meq via ORAL
  Filled 2014-11-15 (×2): qty 2

## 2014-11-15 MED ORDER — ONDANSETRON HCL 4 MG PO TABS
4.0000 mg | ORAL_TABLET | ORAL | Status: DC
  Administered 2014-11-15: 4 mg via ORAL
  Filled 2014-11-15: qty 1

## 2014-11-15 MED ORDER — ONDANSETRON HCL 4 MG/2ML IJ SOLN: 4.0000 mg | INTRAMUSCULAR | Status: DC | PRN

## 2014-11-15 MED ORDER — MAGNESIUM SULFATE 2 GM/50ML IV SOLN
2.0000 g | Freq: Once | INTRAVENOUS | Status: AC
Start: 2014-11-15 — End: 2014-11-15
  Administered 2014-11-15: 2 g via INTRAVENOUS
  Filled 2014-11-15: qty 50

## 2014-11-15 MED ORDER — ONDANSETRON HCL 4 MG/2ML IJ SOLN: 4.0000 mg | INTRAMUSCULAR | Status: DC

## 2014-11-15 NOTE — Plan of Care (Addendum)
Problem: Discharge Progression Outcomes Goal: Other Discharge Outcomes/Goals Outcome: Progressing Pt ambulated around the nursing station, O2 sats dropped to mid 80's, pt c/o dizziness. Dizziness and O2 sats resolved within a minute once back to the bed. No sob. Pt remained on room air with O2 sats>92%. MD notified. O2 prn ordered. Pt needs to be reminded to deep breath and cough. VSS. Oxycodone given once for RUQ pain with improvement. Zofran given once for nausea. Magnesium 1.4, Magnesium IV given. Continue potassium PO. No bleeding noted. Tolerates diet. NPO after midnight for EGD.

## 2014-11-15 NOTE — Progress Notes (Signed)
[facility] [place] NAME: Selena Bennett    MR#:  [id number]  DATE OF BIRTH:  11-Dec-1965  SUBJECTIVE:  CHIEF COMPLAINT:   Chief Complaint  Patient presents with  . Shortness of Breath   No cough or SOB, had BM but no melena or bloody stool. Off O2 [place] 2 L this am. REVIEW OF SYSTEMS:  CONSTITUTIONAL: No fever, no weakness.  EYES: No blurred or double vision.  EARS, NOSE, AND THROAT: No tinnitus or ear pain.  RESPIRATORY: No cough, shortness of breath, wheezing or hemoptysis.  CARDIOVASCULAR: No chest pain, orthopnea, edema.  GASTROINTESTINAL: No nausea, vomiting, diarrhea or abdominal pain.  GENITOURINARY: No dysuria, hematuria.  ENDOCRINE: No polyuria, nocturia,  HEMATOLOGY: No anemia, easy bruising or bleeding SKIN: No rash or lesion. MUSCULOSKELETAL: No joint pain or arthritis.   NEUROLOGIC: No tingling, numbness, weakness.  PSYCHIATRY: No anxiety or depression.   DRUG ALLERGIES:  No Known Allergies  VITALS:  Blood pressure 140/71, pulse 86, temperature 98.4 F (36.9 C), temperature source Oral, resp. rate 18, height 5\' 6"  (1.676 m), weight 101.742 kg (224 lb 4.8 oz), last menstrual period 10/26/2014, SpO2 96 %.  PHYSICAL EXAMINATION:  GENERAL:  49 y.o.-year-old patient lying in the bed with no acute distress. Morbid obese. EYES: Pupils equal, round, reactive to light and accommodation. No scleral icterus. Extraocular muscles intact.  HEENT: Head atraumatic, normocephalic. Oropharynx and nasopharynx clear.  NECK:  Supple, no jugular venous distention. No thyroid enlargement, no tenderness.  LUNGS: Normal breath sounds bilaterally, no wheezing, rales,rhonchi or crepitation. No use of accessory muscles of respiration.  CARDIOVASCULAR: S1, S2 normal. No murmurs, rubs, or gallops.  ABDOMEN: Soft, tenderness on RUQ, nondistended. Bowel sounds present. No organomegaly or mass.  EXTREMITIES: No pedal edema, cyanosis, or clubbing.   NEUROLOGIC: Cranial nerves II through XII are intact. Muscle strength 5/5 in all extremities. Sensation intact. Gait not checked.  PSYCHIATRIC: The patient is alert and oriented x 3.  SKIN: No obvious rash, lesion, or ulcer.    LABORATORY PANEL:   CBC  Recent Labs Lab 11/13/14 0458  WBC 8.1  HGB 10.0*  HCT 30.9*  PLT 127*   ------------------------------------------------------------------------------------------------------------------  Chemistries   Recent Labs Lab 11/13/14 0458  11/15/14 0509  NA 142  < > 139  K 3.0*  < > 3.1*  CL 110  < > 104  CO2 24  < > 29  GLUCOSE 122*  < > 185*  BUN 13  < > 14  CREATININE 1.28*  < > 1.25*  CALCIUM 8.5*  < > 8.1*  MG 1.4*  < > 1.4*  AST 21  --   --   ALT 13*  --   --   ALKPHOS 86  --   --   BILITOT 0.7  --   --   < > = values in this interval not displayed. ------------------------------------------------------------------------------------------------------------------  Cardiac Enzymes  Recent Labs Lab 11/11/14 2102  TROPONINI <0.03   ------------------------------------------------------------------------------------------------------------------  RADIOLOGY:  No results found.  EKG:   Orders placed or performed during the hospital encounter of 11/11/14  . ED EKG within 10 minutes  . ED EKG within 10 minutes  . EKG 12-Lead  . EKG 12-Lead    ASSESSMENT AND PLAN:   1. GI bleed,  suspected acute on chronic slow GI bleed. History of dysphagia varices status post banding in 2015. no active bleeding. Continue po Protonix twice a day. S/p 2 units of  blood transfusion, Hb is stable. F/u GI for EGD tomorrow. Nothing by mouth after midnight. EGD was canceled due to SOB on O2 [place].  2. Chronic thrombocytopenia. Stable.  3. Accelerated hypertension, better controlled.  Continue amlodipine and metoprolol and lisinopril. When necessary IV hydralazine.  4. Diabetes, type II Sliding scale insulin, hold glyburide and   metformin.   5. History of cirrhosis of liver secondary to NASH Patient has had extensive workup in 2015 at [facility]   * Hepatic masses per [place],  Per MRI of abd: Although no liver mass is apparent, a liver mass cannot be excluded.  Triphasic CT recommended. F/u GI. Normal AFP.  * Hypokalemia. KCl supplement and follow-up BMP.  * Hypomagnesemia. Mag supplement and a follow-up level.  * Pulmonary edema with hypoxia. Improved, off O2 [place], echocardiograph is normal with ejection fraction 65-70%. discontinue Lasix.  All the records are reviewed and case discussed with Care Management/Social Workerr. Management plans discussed with the patient, her husband and they are in agreement.  CODE STATUS: Full code.  TOTAL TIME TAKING CARE OF THIS PATIENT: 35 minutes.   POSSIBLE D/C tomorrow after EGD, DEPENDING ON CLINICAL CONDITION.   Demetrios Loll M.D on 11/15/2014 at 11:19 AM  Between 7am to 6pm - Pager - [phone]  After 6pm go to [email or web address] - password [email or web address] [facility]  [place] [facility]  Office  [phone]  CC: Primary care physician; No PCP Per Patient

## 2014-11-15 NOTE — Plan of Care (Signed)
Problem: Discharge Progression Outcomes Goal: Other Discharge Outcomes/Goals Outcome: Progressing Plan of care progress to goal: Barriers-pt continues to with oxygen via [place] at 2 LPM, saturation at 98%( pt came from home on room air.) Discharge plan in place- possible EGD on Monday. Patient will discharge to home at time of discharge.  Pain- Complained of RUQ abd pain, improved with PRN oxycodone.  Diet- c/o nausea with PO potassium supplement, improved with PRN PO Zofran. Hemodynamically stable- VSS, potassium level low 11/14/14 am, pt started on PO potassium supplements, continue to monitor labs. Activity appropriate for discharge- Moderate fall risk, Independent, Ambulates in room.

## 2014-11-16 ENCOUNTER — Inpatient Hospital Stay: Payer: MEDICAID | Admitting: Anesthesiology

## 2014-11-16 ENCOUNTER — Encounter
Admission: EM | Disposition: A | Discharge status: Home / Self Care | Payer: Self-pay | Source: Home / Self Care | Attending: Internal Medicine

## 2014-11-16 ENCOUNTER — Inpatient Hospital Stay: Payer: Self-pay | Admitting: Anesthesiology

## 2014-11-16 HISTORY — PX: ESOPHAGOGASTRODUODENOSCOPY (EGD) WITH PROPOFOL: SHX5813

## 2014-11-16 LAB — BASIC METABOLIC PANEL
ANION GAP: 7 (ref 5–15)
BUN: 15 mg/dL (ref 6–20)
CHLORIDE: 103 mmol/L (ref 101–111)
CO2: 28 mmol/L (ref 22–32)
Calcium: 8.6 mg/dL — ABNORMAL LOW (ref 8.9–10.3)
Creatinine, Ser: 1.3 mg/dL — ABNORMAL HIGH (ref 0.44–1.00)
GFR calc non Af Amer: 48 mL/min — ABNORMAL LOW (ref >60)
GFR, EST AFRICAN AMERICAN: 55 mL/min — AB (ref >60)
Glucose, Bld: 243 mg/dL — ABNORMAL HIGH (ref 65–99)
POTASSIUM: 3.5 mmol/L (ref 3.5–5.1)
Sodium: 138 mmol/L (ref 135–145)

## 2014-11-16 LAB — MAGNESIUM: MAGNESIUM: 1.7 mg/dL (ref 1.7–2.4)

## 2014-11-16 LAB — CBC
HEMATOCRIT: 30.2 % — AB (ref 35.0–47.0)
Hemoglobin: 9.9 g/dL — ABNORMAL LOW (ref 12.0–16.0)
MCH: 25.6 pg — ABNORMAL LOW (ref 26.0–34.0)
MCHC: 32.8 g/dL (ref 32.0–36.0)
MCV: 78.2 fL — AB (ref 80.0–100.0)
PLATELETS: 117 10*3/uL — AB (ref 150–440)
RBC: 3.87 MIL/uL (ref 3.80–5.20)
RDW: 17.3 % — ABNORMAL HIGH (ref 11.5–14.5)
WBC: 5.6 10*3/uL (ref 3.6–11.0)

## 2014-11-16 LAB — GLUCOSE, CAPILLARY
Glucose-Capillary: 188 mg/dL — ABNORMAL HIGH (ref 65–99)
Glucose-Capillary: 178 mg/dL — ABNORMAL HIGH (ref 65–99)
Glucose-Capillary: 292 mg/dL — ABNORMAL HIGH (ref 65–99)

## 2014-11-16 LAB — POCT PREGNANCY, URINE: PREG TEST UR: NEGATIVE

## 2014-11-16 SURGERY — ESOPHAGOGASTRODUODENOSCOPY (EGD) WITH PROPOFOL
Anesthesia: General

## 2014-11-16 MED ORDER — PROPOFOL 500 MG/50ML IV EMUL
INTRAVENOUS | Status: DC | PRN
Start: 2014-11-16 — End: 2014-11-16
  Administered 2014-11-16: 180 ug/kg/min via INTRAVENOUS

## 2014-11-16 MED ORDER — SODIUM CHLORIDE 0.9 % IV SOLN
INTRAVENOUS | Status: DC
  Administered 2014-11-16: 10:00:00 via INTRAVENOUS

## 2014-11-16 MED ORDER — FERROUS SULFATE 325 (65 FE) MG PO TABS: 325.0000 mg | ORAL_TABLET | Freq: Two times a day (BID) | ORAL | Status: DC

## 2014-11-16 MED ORDER — GLYCOPYRROLATE 0.2 MG/ML IJ SOLN
INTRAMUSCULAR | Status: DC | PRN
  Administered 2014-11-16: 0.3 mg via INTRAVENOUS

## 2014-11-16 MED ORDER — PROPOFOL 10 MG/ML IV BOLUS
INTRAVENOUS | Status: DC | PRN
  Administered 2014-11-16: 60 mg via INTRAVENOUS

## 2014-11-16 MED ORDER — SODIUM CHLORIDE 0.9 % IV SOLN
INTRAVENOUS | Status: DC
  Administered 2014-11-16: 1000 mL via INTRAVENOUS

## 2014-11-16 MED ORDER — LIDOCAINE HCL (CARDIAC) 20 MG/ML IV SOLN
INTRAVENOUS | Status: DC | PRN
  Administered 2014-11-16: 100 mg via INTRAVENOUS

## 2014-11-16 NOTE — Transfer of Care (Signed)
Immediate Anesthesia Transfer of Care Note  Patient: Selena Bennett  Procedure(s) Performed: Procedure(s): ESOPHAGOGASTRODUODENOSCOPY (EGD) WITH PROPOFOL (N/A)  Patient Location: PACU  Anesthesia Type:General  Level of Consciousness: awake, alert  and oriented  Airway & Oxygen Therapy: Patient Spontanous Breathing and Patient connected to nasal cannula oxygen  Post-op Assessment: Report given to RN and Post -op Vital signs reviewed and stable  Post vital signs: stable  Last Vitals:  Filed Vitals:   11/16/14 1159  BP: 126/83  Pulse: 94  Temp: 36.8 C  Resp: 12    Complications: No apparent anesthesia complications

## 2014-11-16 NOTE — Interval H&P Note (Signed)
History and Physical Interval Note:  11/16/2014 11:39 AM  Alvia Grove  has presented today for surgery, with the diagnosis of anemia  The various methods of treatment have been discussed with the patient and family. After consideration of risks, benefits and other options for treatment, the patient has consented to  Procedure(s): ESOPHAGOGASTRODUODENOSCOPY (EGD) WITH PROPOFOL (N/A) as a surgical intervention .  The patient's history has been reviewed, patient examined, no change in status, stable for surgery.  I have reviewed the patient's chart and labs.  Questions were answered to the patient's satisfaction.     REIN, MATTHEW GORDON

## 2014-11-16 NOTE — Op Note (Signed)
[facility]astroenterology Patient Name: Selena Bennett Procedure Date: 11/16/2014 11:42 AM MRN: [id number] Account #: [id number] Date of Birth: Jan 07, 1966 Admit Type: Inpatient Age: 49 Room: [facility] ENDO ROOM 1 Gender: Female Note Status: Finalized Procedure:         Upper GI endoscopy Indications:       Anemia, Follow-up of esophageal varices Patient Profile:   This is a 49 year old female. Providers:         Gerrit Heck. Rayann Heman, MD Referring MD:      [facility]Barton Dubois (Referring MD) Medicines:         Propofol per Anesthesia Complications:     No immediate complications. Procedure:         Pre-Anesthesia Assessment:                    - Prior to the procedure, a History and Physical was                     performed, and patient medications, allergies and                     sensitivities were reviewed. The patient's tolerance of                     previous anesthesia was reviewed.                    After obtaining informed consent, the endoscope was passed                     under direct vision. Throughout the procedure, the                     patient's blood pressure, pulse, and oxygen saturations                     were monitored continuously. The Endoscope was introduced                     through the mouth, and advanced to the second part of                     duodenum. The upper GI endoscopy was accomplished without                     difficulty. The patient tolerated the procedure well. Findings:      The esophagus was normal except for scar at banding sites. No varices.      Moderate portal hypertensive gastropathy was found in the cardia and in       the gastric fundus.      Diffuse mild inflammation characterized by erythema was found in the       gastric body.      The examined duodenum was normal. Impression:        - Normal esophagus. Scar at banding sites, no varices.                    - Portal hypertensive gastropathy.             - Gastritis.                    - Normal examined duodenum.                    -  No specimens collected. Recommendation:    - Observe patient in GI recovery unit.                    - Resume regular diet.                    - Continue present medications.                    - Return to liver clinic.                    - Consider colonoscopy and gyncology consult if anemia                     worsens.                    - The findings and recommendations were discussed with the                     patient.                    - The findings and recommendations were discussed with the                     patient's family. Procedure Code(s): --- Professional ---                    [phone], Esophagogastroduodenoscopy, flexible, transoral;                     diagnostic, including collection of specimen(s) by                     brushing or washing, when performed (separate procedure) CPT copyright 2014 American Medical Association. All rights reserved. The codes documented in this report are preliminary and upon coder review may  be revised to meet current compliance requirements. Mellody Life, MD 11/16/2014 11:57:38 AM This report has been signed electronically. Number of Addenda: 0 Note Initiated On: 11/16/2014 11:42 AM      [facility]

## 2014-11-16 NOTE — Progress Notes (Signed)
[place]ris [place] was admitted to the Hospital on 11/11/2014 and Discharged  11/16/2014 and should be excused from work/school for the duration of hospital stay  Start work from 11/16/14  may return to work/school without any restrictions.  Call Fritzi Mandes MD, [facility]  [phone] with questions.  Zymire Turnbo M.D on 11/16/2014,at 5:27 PM

## 2014-11-16 NOTE — Plan of Care (Signed)
Problem: Discharge Progression Outcomes Goal: Other Discharge Outcomes/Goals Outcome: Progressing Plan of care progress to goal: Barriers resolved- pt now on room air, oxygen sats 95% while ambulating around nurses station, no c/o shortness of breath.  Discharge plan in place- EGD scheduled for Monday. Patient wishes to discharge to home at time of discharge.   Pain- Complained of RUQ abd pain, improved with PRN oxycodone.   Diet- c/o nausea with PO oxycodone, improved with PRN PO Zofran. NPO since midnight for scheduled EGD today. Hemodynamically stable- VSS, potassium and magnesium low AM of 11/15/14 supplements ordered and given continue to monitor labs.  continue to monitor labs.  Activity appropriate for discharge- Low fall risk, Independent up to BR.

## 2014-11-16 NOTE — H&P (View-Only) (Signed)
GI Inpatient Consult Note  Reason for Consult: Known h/o varices d/t NASH/cirrhosis   Attending Requesting Consult: Bridgett Larsson  History of Present Illness: Selena Bennett is a 49 y.o. female with a h/o of cirrhosis secondary to NASH, varices s/p banding x 2, DM, HTN, and GERD admitted for SOB and weakness with Hgb 6.9.  She presented to the [facility] ED last night with worsening SOB and weakness for about 2 weeks.  Patient states she works the third shift so she did not think much of her recent fatigue, but became concerned when SOB did not improve.  In the [facility] ED, labs revealed Hgb 6.9, MCV 76.6, Plts 111, PT 16.9, INR 1.35.  FOBT negative.  She was admitted and immediately transfused with 2 units, also started on IV Protonix 40mg  BID.  GI evaluation was requested for further management.  RUQ [place] during admission revealed multiple masses within the liver, with larges in R hepatic lobe measuring 6.0 x 3.9 x 4.6 cm.  There are two smaller masses suspected in R lobe as well. Review of records in [place] show lasst RUQ [place] in 12/14 showed cirrhosis and a central lesion measuring 3.2 x 2.1 x 1.6 cm.  A follow-up MRI in 1/15 showed cirrhosis, esophageal varices, and spenomegaly, but no suggestion of hepatoma.  [name] reports this is the third time she has experienced anemia, both previous episodes due to variceal bleeding.  Dr. Benson Norway at [facility] performed variceal banding twice, but during third EGD he did not band.  Patient notes she has not followed with him because she was unhappy with the care she received as an OP.  Today, [name] states she is experiencing bloating and LE swelling, both of which started Saturday.  She also notes mild abdominal cramping.  She denies other liver-related problems like jaundice, itching, confusion, rectal bleeding, and dark stools.  She also denies recent EtOH and tries to maintain a low-salt diet.  No fevers, chills, dysphagia, reflux, n/v, diarrhea, constipation,  hematochezia, and melena.  Past Medical History:  Past Medical History  Diagnosis Date  . Diabetes ([place])   . Hypertension   . Esophageal varices with hemorrhage ([place]) 01/30/2013  . Anemia     [facility] 02-04-13 acute blood loss  . GERD (gastroesophageal reflux disease)     Problem List: Patient Active Problem List   Diagnosis Date Noted  . Anemia 11/11/2014  . GI bleed 04/21/2013  . Azotemia 04/21/2013  . Acute renal failure ([place]) 04/21/2013  . Thrombocytopenia, unspecified ([place]) 02/05/2013  . Odynophagia 02/01/2013  . Liver lesion 02/01/2013  . HTN (hypertension) 01/30/2013  . DM (diabetes mellitus) ([place]) 01/30/2013  . Liver cirrhosis secondary to NASH (nonalcoholic steatohepatitis) 01/30/2013  . Acute blood loss anemia 01/30/2013  . Esophageal varices with hemorrhage ([place]) 01/30/2013  . GIB (gastrointestinal bleeding) 01/28/2013    Past Surgical History: Past Surgical History  Procedure Laterality Date  . Cholecystectomy open    . Esophagogastroduodenoscopy N/A 01/28/2013    Procedure: ESOPHAGOGASTRODUODENOSCOPY (EGD);  Surgeon: Beryle Beams, MD;  Location: [facility] ENDOSCOPY;  Service: Endoscopy;  Laterality: N/A;  bedside  . Esophagogastroduodenoscopy N/A 02/21/2013    Procedure: ESOPHAGOGASTRODUODENOSCOPY (EGD);  Surgeon: Beryle Beams, MD;  Location: [facility] ENDOSCOPY;  Service: Endoscopy;  Laterality: N/A;  . Esophageal banding N/A 02/21/2013    Procedure: ESOPHAGEAL BANDING;  Surgeon: Beryle Beams, MD;  Location: [facility] ENDOSCOPY;  Service: Endoscopy;  Laterality: N/A;  . C section x1  1989  . Esophagogastroduodenoscopy N/A 03/21/2013    Procedure: ESOPHAGOGASTRODUODENOSCOPY (EGD);  Surgeon: Beryle Beams, MD;  Location: [facility] ENDOSCOPY;  Service: Endoscopy;  Laterality: N/A;  . Esophageal banding N/A 03/21/2013    Procedure: ESOPHAGEAL BANDING;  Surgeon: Beryle Beams, MD;  Location: [facility] ENDOSCOPY;  Service: Endoscopy;  Laterality: N/A;  . Esophagogastroduodenoscopy N/A  04/22/2013    Procedure: ESOPHAGOGASTRODUODENOSCOPY (EGD);  Surgeon: Beryle Beams, MD;  Location: [facility] ENDOSCOPY;  Service: Endoscopy;  Laterality: N/A;  . Cholecystectomy      Allergies: No Known Allergies  Home Medications: Prescriptions prior to admission  Medication Sig Dispense Refill Last Dose  . amLODipine (NORVASC) 10 MG tablet Take 1 tablet (10 mg total) by mouth daily. 30 tablet 0 11/11/2014 at pm  . aspirin 81 MG chewable tablet Chew 81 mg by mouth daily.   11/11/2014 at Unknown time  . cyclobenzaprine (FLEXERIL) 10 MG tablet Take 10 mg by mouth 3 (three) times daily as needed for muscle spasms.   11/11/2014 at Unknown time  . glipiZIDE (GLUCOTROL XL) 10 MG 24 hr tablet Take 10 mg by mouth daily.   11/11/2014 at Unknown time  . ibuprofen (ADVIL,MOTRIN) 800 MG tablet Take 1 tablet by mouth 3 (three) times daily as needed for moderate pain.    11/11/2014 at Unknown time  . lisinopril (PRINIVIL,ZESTRIL) 20 MG tablet Take 1 tablet (20 mg total) by mouth daily. (Patient taking differently: Take 40 mg by mouth daily. ) 30 tablet 0 11/11/2014 at Unknown time  . metFORMIN (GLUMETZA) 500 MG (MOD) 24 hr tablet Take 500 mg by mouth 2 (two) times daily with a meal.   11/11/2014 at Unknown time  . metoprolol (LOPRESSOR) 50 MG tablet Take 50 mg by mouth 2 (two) times daily.   11/11/2014 at pm  . pantoprazole (PROTONIX) 40 MG tablet Take 1 tablet (40 mg total) by mouth daily. (Patient taking differently: Take 20 mg by mouth daily. ) 30 tablet 0 11/11/2014 at Unknown time  . oxyCODONE-acetaminophen (ROXICET) 5-325 MG per tablet Take 1 tablet by mouth every 6 (six) hours as needed for severe pain. (Patient not taking: Reported on 11/11/2014) 30 tablet 0 Not Taking at Unknown time   Home medication reconciliation was completed with the patient.   Scheduled Inpatient Medications:   . acetaminophen  650 mg Oral Once  . amLODipine  10 mg Oral Daily  . docusate sodium  100 mg Oral BID  . glipiZIDE  10  mg Oral Q breakfast  . insulin aspart  0-15 Units Subcutaneous TID WC  . metoprolol  50 mg Oral BID  . pantoprazole (PROTONIX) IV  40 mg Intravenous Q12H    Continuous Inpatient Infusions:   . sodium chloride 50 mL/hr at 11/12/14 0624    PRN Inpatient Medications:  cyclobenzaprine, hydrALAZINE, ondansetron **OR** ondansetron (ZOFRAN) IV, oxyCODONE  Family History: family history is not on file.    Social History:   reports that she has never smoked. She has never used smokeless tobacco. She reports that she does not drink alcohol or use illicit drugs.   Review of Systems: Constitutional: Weight is stable.  Eyes: No changes in vision. ENT: No oral lesions, sore throat.  GI: see HPI.  Heme/Lymph: No easy bruising.  CV: No chest pain.  GU: No hematuria.  Integumentary: No rashes.  Neuro: No headaches.  Psych: No depression/anxiety.  Endocrine: No heat/cold intolerance.  Allergic/Immunologic: No urticaria.  Resp: No cough; + SOB.  Musculoskeletal: + LE swelling  bilaterally.    Physical Examination: BP 158/77 mmHg  Pulse 96  Temp(Src) 97.9 F (36.6 C) (Oral)  Resp 16  Ht 5\' 6"  (1.676 m)  Wt 101.742 kg (224 lb 4.8 oz)  BMI 36.22 kg/m2  SpO2 93%  LMP 10/26/2014 (Approximate) Gen: NAD, alert and oriented x 4, family present at bedside HEENT: PEERLA, EOMI, Neck: supple, no JVD or thyromegaly Chest: CTA bilaterally, no wheezes, crackles, or other adventitious sounds CV: RRR, no m/g/c/r Abd: soft, NT, ND, +BS in all four quadrants; no HSM, guarding, ridigity, or rebound tenderness Ext: no edema, well perfused with 2+ pulses, Skin: no rash or lesions noted Lymph: no LAD  Data: Lab Results  Component Value Date   WBC 7.3 11/12/2014   HGB 9.3* 11/12/2014   HCT 29.2* 11/12/2014   MCV 79.6* 11/12/2014   PLT 120* 11/12/2014    Recent Labs Lab 11/11/14 2102 11/12/14 0757  HGB 6.9* 9.3*   Lab Results  Component Value Date   NA 140 11/11/2014   K 3.2*  11/11/2014   CL 109 11/11/2014   CO2 25 11/11/2014   BUN 17 11/11/2014   CREATININE 1.24* 11/11/2014   Lab Results  Component Value Date   ALT 17 01/18/2014   AST 36 01/18/2014   ALKPHOS 162* 01/18/2014   BILITOT 1.0 01/18/2014    Recent Labs Lab 11/11/14 2247  INR 1.35   Assessment/Plan: Ms. Schlemmer is a 49 y.o. female  h/o of cirrhosis secondary to NASH, varices s/p banding x 2, DM, HTN, and GERD admitted for SOB and weakness with Hgb 6.9.  2 units PRBC raised Hgb to 9.3, hemodynamics stable.  HFP pending.  RUQ US showed multiple hepatic lesions in R lobe; only one, smaller lesion was present on last [place] in 2014.  I ordered an AFP to evaluate for [place].  Also recommend EGD to evaluate for re-bleeding of varices, will plan per Dr. Rayann Heman.  Will continue to follow.  Recommendations: - Await HFP, AFP - Schedule EGD per Dr. Rayann Heman - Monitor Hgb, transfuse if <7  Thank you for the consult. We will follow along with you. Please call with questions or concerns.  Lavera Guise, PA-C [facility] Phone: [phone] Pager: [phone]

## 2014-11-16 NOTE — Discharge Planning (Addendum)
Pt IV removed.  DC papers given, explained and educated.  Told of suggested FU appts and also to get iron supp from pharm.  VSS and RN assessment revealed stability for DC to home.  Pt will be walked to front and transported home via car by family. BS 292 - RN gave patient 8U Novolog.

## 2014-11-16 NOTE — Anesthesia Preprocedure Evaluation (Addendum)
Anesthesia Evaluation  Patient identified by MRN, date of birth, ID band Patient awake    Reviewed: Allergy & Precautions, NPO status , Patient's Chart, lab work & pertinent test results  Airway Mallampati: III       Dental  (+) Teeth Intact   Pulmonary neg pulmonary ROS,           Cardiovascular hypertension, Pt. on medications and Pt. on home beta blockers +CHF       Neuro/Psych negative neurological ROS     GI/Hepatic GERD  Medicated,(+) Cirrhosis   Esophageal Varices    ,   Endo/Other  diabetes, Type 2, Oral Hypoglycemic Agents  Renal/GU ARF     Musculoskeletal   Abdominal   Peds  Hematology  (+) anemia ,   Anesthesia Other Findings   Reproductive/Obstetrics                            Anesthesia Physical Anesthesia Plan  ASA: III  Anesthesia Plan: General   Post-op Pain Management:    Induction: Intravenous  Airway Management Planned: Nasal Cannula  Additional Equipment:   Intra-op Plan:   Post-operative Plan:   Informed Consent: I have reviewed the patients History and Physical, chart, labs and discussed the procedure including the risks, benefits and alternatives for the proposed anesthesia with the patient or authorized representative who has indicated his/her understanding and acceptance.     Plan Discussed with:   Anesthesia Plan Comments:         Anesthesia Quick Evaluation

## 2014-11-16 NOTE — Anesthesia Postprocedure Evaluation (Signed)
  Anesthesia Post-op Note  Patient: Selena Bennett  Procedure(s) Performed: Procedure(s): ESOPHAGOGASTRODUODENOSCOPY (EGD) WITH PROPOFOL (N/A)  Anesthesia type:General  Patient location: PACU  Post pain: Pain level controlled  Post assessment: Post-op Vital signs reviewed, Patient's Cardiovascular Status Stable, Respiratory Function Stable, Patent Airway and No signs of Nausea or vomiting  Post vital signs: Reviewed and stable  Last Vitals:  Filed Vitals:   11/16/14 1208  BP: 142/81  Pulse: 90  Temp: 36.6 C  Resp: 16    Level of consciousness: awake, alert  and patient cooperative  Complications: No apparent anesthesia complications

## 2014-11-16 NOTE — Discharge Summary (Signed)
[facility] [place] NAME: Selena Bennett    MR#:  [id number]  DATE OF BIRTH:  April 06, 1965  DATE OF ADMISSION:  11/11/2014 ADMITTING PHYSICIAN: Fritzi Mandes, MD  DATE OF DISCHARGE: 11/16/2014  PRIMARY CARE PHYSICIAN: No PCP Per Patient    ADMISSION DIAGNOSIS:  Shortness of breath [R06.02] Cirrhosis ([place]) [K74.60] Anemia, unspecified anemia type [D64.9]  DISCHARGE DIAGNOSIS:  Active Problems:   Anemia   SECONDARY DIAGNOSIS:   Past Medical History  Diagnosis Date  . Diabetes ([place])   . Hypertension   . Esophageal varices with hemorrhage ([place]) 01/30/2013  . Anemia     [facility] 02-04-13 acute blood loss  . GERD (gastroesophageal reflux disease)     HOSPITAL COURSE:   1. GI bleed, suspected acute on chronic slow GI bleed. History of dysphagia varices status post banding in 2015. no active bleeding. Continue po Protonix twice a day. S/p 2 units of blood transfusion, Hb is stable. EGD- no active bleed or source- suggested follow up in clinic and colonoscopy as out pt.  2. Chronic thrombocytopenia. Stable.  3. Accelerated hypertension, better controlled. Continue amlodipine and metoprolol and lisinopril. When necessary IV hydralazine.  4. Diabetes, type II Sliding scale insulin, hold glyburide and metformin.   5. History of cirrhosis of liver secondary to NASH Patient has had extensive workup in 2015 at [facility]   * Hepatic masses per [place], Per MRI of abd: Although no liver mass is apparent, a liver mass cannot be excluded. Triphasic CT recommended. F/u GI. Normal AFP.  * Hypokalemia. KCl supplement and follow-up BMP.  * Hypomagnesemia. Mag supplement and a follow-up level.  * Pulmonary edema with hypoxia. Improved, off O2 [place], echocardiograph is normal with ejection fraction 65-70%. discontinue Lasix.  DISCHARGE CONDITIONS:   Stable.  CONSULTS OBTAINED:  Treatment Team:  Josefine Class, MD  DRUG  ALLERGIES:  No Known Allergies  DISCHARGE MEDICATIONS:   Current Discharge Medication List    START taking these medications   Details  ferrous sulfate (FERROUSUL) 325 (65 FE) MG tablet Take 1 tablet (325 mg total) by mouth 2 (two) times daily with a meal. Qty: 60 tablet, Refills: 3      CONTINUE these medications which have NOT CHANGED   Details  amLODipine (NORVASC) 10 MG tablet Take 1 tablet (10 mg total) by mouth daily. Qty: 30 tablet, Refills: 0    cyclobenzaprine (FLEXERIL) 10 MG tablet Take 10 mg by mouth 3 (three) times daily as needed for muscle spasms.    glipiZIDE (GLUCOTROL XL) 10 MG 24 hr tablet Take 10 mg by mouth daily.    ibuprofen (ADVIL,MOTRIN) 800 MG tablet Take 1 tablet by mouth 3 (three) times daily as needed for moderate pain.     lisinopril (PRINIVIL,ZESTRIL) 20 MG tablet Take 1 tablet (20 mg total) by mouth daily. Qty: 30 tablet, Refills: 0    metFORMIN (GLUMETZA) 500 MG (MOD) 24 hr tablet Take 500 mg by mouth 2 (two) times daily with a meal.    metoprolol (LOPRESSOR) 50 MG tablet Take 50 mg by mouth 2 (two) times daily.    pantoprazole (PROTONIX) 40 MG tablet Take 1 tablet (40 mg total) by mouth daily. Qty: 30 tablet, Refills: 0    oxyCODONE-acetaminophen (ROXICET) 5-325 MG per tablet Take 1 tablet by mouth every 6 (six) hours as needed for severe pain. Qty: 30 tablet, Refills: 0      STOP taking these medications  aspirin 81 MG chewable tablet          DISCHARGE INSTRUCTIONS:    Follow with GI clinic in 2 weeks.  If you experience worsening of your admission symptoms, develop shortness of breath, life threatening emergency, suicidal or homicidal thoughts you must seek medical attention immediately by calling 911 or calling your MD immediately  if symptoms less severe.  You Must read complete instructions/literature along with all the possible adverse reactions/side effects for all the Medicines you take and that have been prescribed to  you. Take any new Medicines after you have completely understood and accept all the possible adverse reactions/side effects.   Please note  You were cared for by a hospitalist during your hospital stay. If you have any questions about your discharge medications or the care you received while you were in the hospital after you are discharged, you can call the unit and asked to speak with the hospitalist on call if the hospitalist that took care of you is not available. Once you are discharged, your primary care physician will handle any further medical issues. Please note that NO REFILLS for any discharge medications will be authorized once you are discharged, as it is imperative that you return to your primary care physician (or establish a relationship with a primary care physician if you do not have one) for your aftercare needs so that they can reassess your need for medications and monitor your lab values.    Today   CHIEF COMPLAINT:   Chief Complaint  Patient presents with  . Shortness of Breath    HISTORY OF PRESENT ILLNESS:  Selena Bennett  is a 49 y.o. female with a known history of cirrhosis due to NASH, history of esophageal varices status post banding 2, type 2 diabetes, hypertension comes in the emergency room with complaints of increasing shortness of breath weakness and left arm pain. She also complained of some distended abdomen. Denies any melena vomiting blood or any foul-smelling urine. Denies any fever. Denies any chest pain. Patient was found to have hematocrit of 6.9. Her hemoglobin was 10.5 checked by her primary care physician few months ago.  She has history of cirrhosis of liver with esophageal varices status post banding 2 in 2015. Procedure was done by Dr. Dava Najjar at [facility]. Patient denies any history of alcoholism. She is supposed to get 2 units of blood transfusion that was ordered by the ER physician. Patient is being admitted for further pressure management  for acute on chronic anemia likely secondary to chronic slow GI bleed.   VITAL SIGNS:  Blood pressure 168/87, pulse 89, temperature 97.9 F (36.6 C), temperature source Oral, resp. rate 20, height 5\' 6"  (1.676 m), weight 101.742 kg (224 lb 4.8 oz), last menstrual period 10/26/2014, SpO2 99 %.  I/O:   Intake/Output Summary (Last 24 hours) at 11/16/14 1625 Last data filed at 11/16/14 1154  Gross per 24 hour  Intake    200 ml  Output      0 ml  Net    200 ml    PHYSICAL EXAMINATION:   GENERAL: 49 y.o.-year-old patient lying in the bed with no acute distress. Morbid obese. EYES: Pupils equal, round, reactive to light and accommodation. No scleral icterus. Extraocular muscles intact.  HEENT: Head atraumatic, normocephalic. Oropharynx and nasopharynx clear.  NECK: Supple, no jugular venous distention. No thyroid enlargement, no tenderness.  LUNGS: Normal breath sounds bilaterally, no wheezing, rales,rhonchi or crepitation. No use of accessory muscles of  respiration.  CARDIOVASCULAR: S1, S2 normal. No murmurs, rubs, or gallops.  ABDOMEN: Soft, tenderness on RUQ, nondistended. Bowel sounds present. No organomegaly or mass.  EXTREMITIES: No pedal edema, cyanosis, or clubbing.  NEUROLOGIC: Cranial nerves II through XII are intact. Muscle strength 5/5 in all extremities. Sensation intact. Gait not checked.  PSYCHIATRIC: The patient is alert and oriented x 3.  SKIN: No obvious rash, lesion, or ulcer.   DATA REVIEW:   CBC  Recent Labs Lab 11/16/14 0509  WBC 5.6  HGB 9.9*  HCT 30.2*  PLT 117*    Chemistries   Recent Labs Lab 11/13/14 0458  11/16/14 0509  NA 142  < > 138  K 3.0*  < > 3.5  CL 110  < > 103  CO2 24  < > 28  GLUCOSE 122*  < > 243*  BUN 13  < > 15  CREATININE 1.28*  < > 1.30*  CALCIUM 8.5*  < > 8.6*  MG 1.4*  < > 1.7  AST 21  --   --   ALT 13*  --   --   ALKPHOS 86  --   --   BILITOT 0.7  --   --   < > = values in this interval not  displayed.  Cardiac Enzymes  Recent Labs Lab 11/11/14 2102  TROPONINI <0.03    Microbiology Results  Results for orders placed or performed in visit on 01/18/14  Urine culture     Status: None   Collection Time: 01/18/14 10:00 PM  Result Value Ref Range Status   Micro Text Report   Final       SOURCE: CLEAN CATCH    ORGANISM 1                >100,000 CFU/ML Escherichia coli   ANTIBIOTIC                    ORG#1     AMPICILLIN                    R         CEFAZOLIN                     S         CEFOXITIN                     S         CEFTRIAXONE                   S         CIPROFLOXACIN                 S         GENTAMICIN                    S         IMIPENEM                      S         LEVOFLOXACIN                  S         NITROFURANTOIN                S         Trimethoprim/Sulfamethoxazole R  RADIOLOGY:  No results found.  EKG:   Orders placed or performed during the hospital encounter of 11/11/14  . ED EKG within 10 minutes  . ED EKG within 10 minutes  . EKG 12-Lead  . EKG 12-Lead      Management plans discussed with the patient, family and they are in agreement.  CODE STATUS:     Code Status Orders        Start     Ordered   11/11/14 2347  Full code   Continuous     11/11/14 2346      TOTAL TIME TAKING CARE OF THIS PATIENT: 40 minutes.    Vaughan Basta M.D on 11/16/2014 at 4:25 PM  Between 7am to 6pm - Pager - [phone]  After 6pm go to [email or web address] - password [email or web address] [facility]  [facility]  Office  [phone]  CC: Primary care physician; No PCP Per Patient   Note: This dictation was prepared with Dragon dictation along with smaller phrase technology. Any transcriptional errors that result from this process are unintentional.

## 2014-11-16 NOTE — Discharge Instructions (Signed)
Anemia, Nonspecific Anemia is a condition in which the concentration of red blood cells or hemoglobin in the blood is below normal. Hemoglobin is a substance in red blood cells that carries oxygen to the tissues of the body. Anemia results in not enough oxygen reaching these tissues.  CAUSES  Common causes of anemia include:   Excessive bleeding. Bleeding may be internal or external. This includes excessive bleeding from periods (in women) or from the intestine.   Poor nutrition.   Chronic kidney, thyroid, and liver disease.  Bone marrow disorders that decrease red blood cell production.  Cancer and treatments for cancer.  HIV, AIDS, and their treatments.  Spleen problems that increase red blood cell destruction.  Blood disorders.  Excess destruction of red blood cells due to infection, medicines, and autoimmune disorders. SIGNS AND SYMPTOMS   Minor weakness.   Dizziness.   Headache.  Palpitations.   Shortness of breath, especially with exercise.   Paleness.  Cold sensitivity.  Indigestion.  Nausea.  Difficulty sleeping.  Difficulty concentrating. Symptoms may occur suddenly or they may develop slowly.  DIAGNOSIS  Additional blood tests are often needed. These help your health care provider determine the best treatment. Your health care provider will check your stool for blood and look for other causes of blood loss.  TREATMENT  Treatment varies depending on the cause of the anemia. Treatment can include:   Supplements of iron, vitamin B12, or folic acid.   Hormone medicines.   A blood transfusion. This may be needed if blood loss is severe.   Hospitalization. This may be needed if there is significant continual blood loss.   Dietary changes.  Spleen removal. HOME CARE INSTRUCTIONS Keep all follow-up appointments. It often takes many weeks to correct anemia, and having your health care provider check on your condition and your response to  treatment is very important. SEEK IMMEDIATE MEDICAL CARE IF:   You develop extreme weakness, shortness of breath, or chest pain.   You become dizzy or have trouble concentrating.  You develop heavy vaginal bleeding.   You develop a rash.   You have bloody or black, tarry stools.   You faint.   You vomit up blood.   You vomit repeatedly.   You have abdominal pain.  You have a fever or persistent symptoms for more than 2-3 days.   You have a fever and your symptoms suddenly get worse.   You are dehydrated.  MAKE SURE YOU:  Understand these instructions.  Will watch your condition.  Will get help right away if you are not doing well or get worse.   This information is not intended to replace advice given to you by your health care provider. Make sure you discuss any questions you have with your health care provider.   Document Released: 02/24/2004 Document Revised: 09/18/2012 Document Reviewed: 07/12/2012 Elsevier Interactive Patient Education 2016 [employer]  

## 2014-11-18 ENCOUNTER — Encounter: Payer: Self-pay | Admitting: Gastroenterology

## 2014-12-04 LAB — HEPATIC FUNCTION PANEL
ALBUMIN: 3.3 g/dL — AB (ref 3.5–5.0)
ALT: 11 U/L — AB (ref 14–54)
AST: 23 U/L (ref 15–41)
Alkaline Phosphatase: 79 U/L (ref 38–126)
TOTAL PROTEIN: 7.2 g/dL (ref 6.5–8.1)
Total Bilirubin: 0.3 mg/dL (ref 0.3–1.2)

## 2017-03-26 ENCOUNTER — Encounter: Payer: Self-pay | Admitting: Emergency Medicine

## 2017-03-26 ENCOUNTER — Other Ambulatory Visit: Payer: Self-pay

## 2017-03-26 ENCOUNTER — Inpatient Hospital Stay
Admission: EM | Admit: 2017-03-26 | Discharge: 2017-04-01 | DRG: 853 | Disposition: A | Discharge status: Home / Self Care | Payer: Self-pay | Attending: Obstetrics and Gynecology | Admitting: Obstetrics and Gynecology

## 2017-03-26 DIAGNOSIS — Z794 Long term (current) use of insulin: Secondary | ICD-10-CM

## 2017-03-26 DIAGNOSIS — K3533 Acute appendicitis with perforation and localized peritonitis, with abscess: Secondary | ICD-10-CM | POA: Diagnosis present

## 2017-03-26 DIAGNOSIS — I129 Hypertensive chronic kidney disease with stage 1 through stage 4 chronic kidney disease, or unspecified chronic kidney disease: Secondary | ICD-10-CM | POA: Diagnosis present

## 2017-03-26 DIAGNOSIS — R197 Diarrhea, unspecified: Secondary | ICD-10-CM

## 2017-03-26 DIAGNOSIS — N183 Chronic kidney disease, stage 3 (moderate): Secondary | ICD-10-CM | POA: Diagnosis present

## 2017-03-26 DIAGNOSIS — K760 Fatty (change of) liver, not elsewhere classified: Secondary | ICD-10-CM | POA: Diagnosis present

## 2017-03-26 DIAGNOSIS — K219 Gastro-esophageal reflux disease without esophagitis: Secondary | ICD-10-CM | POA: Diagnosis present

## 2017-03-26 DIAGNOSIS — E875 Hyperkalemia: Secondary | ICD-10-CM | POA: Diagnosis present

## 2017-03-26 DIAGNOSIS — Z9889 Other specified postprocedural states: Secondary | ICD-10-CM

## 2017-03-26 DIAGNOSIS — N950 Postmenopausal bleeding: Secondary | ICD-10-CM | POA: Diagnosis present

## 2017-03-26 DIAGNOSIS — D270 Benign neoplasm of right ovary: Secondary | ICD-10-CM | POA: Diagnosis present

## 2017-03-26 DIAGNOSIS — K529 Noninfective gastroenteritis and colitis, unspecified: Secondary | ICD-10-CM | POA: Diagnosis present

## 2017-03-26 DIAGNOSIS — D631 Anemia in chronic kidney disease: Secondary | ICD-10-CM | POA: Diagnosis present

## 2017-03-26 DIAGNOSIS — K766 Portal hypertension: Secondary | ICD-10-CM | POA: Diagnosis present

## 2017-03-26 DIAGNOSIS — R188 Other ascites: Secondary | ICD-10-CM | POA: Diagnosis present

## 2017-03-26 DIAGNOSIS — R161 Splenomegaly, not elsewhere classified: Secondary | ICD-10-CM | POA: Diagnosis present

## 2017-03-26 DIAGNOSIS — E1122 Type 2 diabetes mellitus with diabetic chronic kidney disease: Secondary | ICD-10-CM | POA: Diagnosis present

## 2017-03-26 DIAGNOSIS — E860 Dehydration: Secondary | ICD-10-CM | POA: Diagnosis present

## 2017-03-26 DIAGNOSIS — R1031 Right lower quadrant pain: Secondary | ICD-10-CM

## 2017-03-26 DIAGNOSIS — I8501 Esophageal varices with bleeding: Secondary | ICD-10-CM | POA: Diagnosis present

## 2017-03-26 DIAGNOSIS — K7460 Unspecified cirrhosis of liver: Secondary | ICD-10-CM | POA: Diagnosis present

## 2017-03-26 DIAGNOSIS — N179 Acute kidney failure, unspecified: Secondary | ICD-10-CM | POA: Diagnosis present

## 2017-03-26 DIAGNOSIS — E119 Type 2 diabetes mellitus without complications: Secondary | ICD-10-CM | POA: Diagnosis present

## 2017-03-26 DIAGNOSIS — K5730 Diverticulosis of large intestine without perforation or abscess without bleeding: Secondary | ICD-10-CM | POA: Diagnosis present

## 2017-03-26 DIAGNOSIS — A419 Sepsis, unspecified organism: Principal | ICD-10-CM | POA: Diagnosis present

## 2017-03-26 HISTORY — DX: Fatty (change of) liver, not elsewhere classified: K76.0

## 2017-03-26 HISTORY — DX: Unspecified cirrhosis of liver: K74.60

## 2017-03-26 LAB — CBC WITH DIFFERENTIAL/PLATELET
Basophils Absolute: 0 10*3/uL (ref 0–0.1)
Basophils Relative: 0 %
EOS ABS: 0 10*3/uL (ref 0–0.7)
EOS PCT: 0 %
HCT: 28.4 % — ABNORMAL LOW (ref 35.0–47.0)
Hemoglobin: 9.2 g/dL — ABNORMAL LOW (ref 12.0–16.0)
LYMPHS ABS: 0.8 10*3/uL — AB (ref 1.0–3.6)
LYMPHS PCT: 5 %
MCH: 29.9 pg (ref 26.0–34.0)
MCHC: 32.4 g/dL (ref 32.0–36.0)
MCV: 92.2 fL (ref 80.0–100.0)
MONO ABS: 0.5 10*3/uL (ref 0.2–0.9)
Monocytes Relative: 3 %
Neutro Abs: 13.7 10*3/uL — ABNORMAL HIGH (ref 1.4–6.5)
Neutrophils Relative %: 92 %
PLATELETS: 140 10*3/uL — AB (ref 150–440)
RBC: 3.08 MIL/uL — AB (ref 3.80–5.20)
RDW: 13.8 % (ref 11.5–14.5)
WBC: 15 10*3/uL — AB (ref 3.6–11.0)

## 2017-03-26 LAB — COMPREHENSIVE METABOLIC PANEL
ALBUMIN: 3.6 g/dL (ref 3.5–5.0)
ALK PHOS: 83 U/L (ref 38–126)
ALT: 13 U/L — ABNORMAL LOW (ref 14–54)
ANION GAP: 9 (ref 5–15)
AST: 29 U/L (ref 15–41)
BUN: 39 mg/dL — ABNORMAL HIGH (ref 6–20)
CALCIUM: 7.8 mg/dL — AB (ref 8.9–10.3)
CO2: 16 mmol/L — AB (ref 22–32)
Chloride: 106 mmol/L (ref 101–111)
Creatinine, Ser: 2.37 mg/dL — ABNORMAL HIGH (ref 0.44–1.00)
GFR calc non Af Amer: 23 mL/min — ABNORMAL LOW (ref >60)
GFR, EST AFRICAN AMERICAN: 26 mL/min — AB (ref >60)
GLUCOSE: 318 mg/dL — AB (ref 65–99)
POTASSIUM: 7.3 mmol/L — AB (ref 3.5–5.1)
SODIUM: 131 mmol/L — AB (ref 135–145)
Total Bilirubin: 1.1 mg/dL (ref 0.3–1.2)
Total Protein: 8.2 g/dL — ABNORMAL HIGH (ref 6.5–8.1)

## 2017-03-26 LAB — LIPASE, BLOOD: LIPASE: 34 U/L (ref 11–51)

## 2017-03-26 NOTE — ED Provider Notes (Signed)
[facility] Emergency Department Provider Note  ____________________________________________   First MD Initiated Contact with Patient 03/26/17 2257     (approximate)  I have reviewed the triage vital signs and the nursing notes.   HISTORY  Chief Complaint Abdominal Pain    HPI Selena Bennett is a 52 y.o. female with medical history as listed below but no history of alcohol use (liver problems are nonalcoholic in nature) who presents for evaluation of gradually worsening right lower quadrant abdominal pain over the last 2 weeks.  She states that she has not had a period in an extended period of time but then last month she had vaginal bleeding that lasted several days and was normal for her in terms of a menstrual cycle (several days of generally light bleeding).  It was accompanied with some pain in her right lower quadrant which subsequently resolved.  She has had no bleeding since then and she followed up with her primary care doctor last week which was the first available appointment to her since the vaginal bleeding.  Since she had been amenorrheic for an extended period of time and based on her age, the primary care doctor set her up with a referral to [facility] clinic OB[facility]GYN.  She has an appointment tomorrow with Dr. Ouida Sills and for an ultrasound for further evaluation.  Regarding her chief complaint, she reports that about 2 weeks ago she started developing mild pain in her lower abdomen for which she did not need to take any medicine but was present most of the time.  Over the last couple of days it has gotten worse and today it was severe.  She had 5 soft but not watery stools earlier today.  She has had some nausea but no vomiting.  She describes the pain is severe and it is worse when she is ambulating and better when she is resting.  She has had no additional vaginal bleeding and also denies dysuria.  She was unaware that she had a fever but her  temperature was slightly elevated in triage at 100.3 degrees.  She denies chills, chest pain, shortness of breath, nasal congestion, sore throat, runny nose, and vomiting.  Past Medical History:  Diagnosis Date  . Anemia    [facility] 02-04-13 acute blood loss  . Cirrhosis ([place])   . Diabetes ([place])   . Esophageal varices with hemorrhage ([place]) 01/30/2013  . Fatty liver   . GERD (gastroesophageal reflux disease)   . Hypertension     Patient Active Problem List   Diagnosis Date Noted  . AKI (acute kidney injury) ([place]) 03/27/2017  . Anemia 11/11/2014  . GI bleed 04/21/2013  . Azotemia 04/21/2013  . Acute renal failure ([place]) 04/21/2013  . Thrombocytopenia, unspecified ([place]) 02/05/2013  . Odynophagia 02/01/2013  . Liver lesion 02/01/2013  . HTN (hypertension) 01/30/2013  . DM (diabetes mellitus) ([place]) 01/30/2013  . Liver cirrhosis secondary to NASH (nonalcoholic steatohepatitis) ([place]) 01/30/2013  . Acute blood loss anemia 01/30/2013  . Esophageal varices with hemorrhage ([place]) 01/30/2013  . GIB (gastrointestinal bleeding) 01/28/2013    Past Surgical History:  Procedure Laterality Date  . C section x1     1989  . CHOLECYSTECTOMY    . CHOLECYSTECTOMY OPEN    . ESOPHAGEAL BANDING N/A 02/21/2013   Procedure: ESOPHAGEAL BANDING;  Surgeon: Beryle Beams, MD;  Location: [facility] ENDOSCOPY;  Service: Endoscopy;  Laterality: N/A;  . ESOPHAGEAL BANDING N/A 03/21/2013   Procedure: ESOPHAGEAL BANDING;  Surgeon: Saralyn Pilar  Renee Ramus, MD;  Location: [facility];  Service: Endoscopy;  Laterality: N/A;  . ESOPHAGOGASTRODUODENOSCOPY N/A 01/28/2013   Procedure: ESOPHAGOGASTRODUODENOSCOPY (EGD);  Surgeon: Beryle Beams, MD;  Location: [facility] ENDOSCOPY;  Service: Endoscopy;  Laterality: N/A;  bedside  . ESOPHAGOGASTRODUODENOSCOPY N/A 02/21/2013   Procedure: ESOPHAGOGASTRODUODENOSCOPY (EGD);  Surgeon: Beryle Beams, MD;  Location: [facility];  Service: Endoscopy;  Laterality: N/A;  . ESOPHAGOGASTRODUODENOSCOPY N/A  03/21/2013   Procedure: ESOPHAGOGASTRODUODENOSCOPY (EGD);  Surgeon: Beryle Beams, MD;  Location: [facility];  Service: Endoscopy;  Laterality: N/A;  . ESOPHAGOGASTRODUODENOSCOPY N/A 04/22/2013   Procedure: ESOPHAGOGASTRODUODENOSCOPY (EGD);  Surgeon: Beryle Beams, MD;  Location: [facility] ENDOSCOPY;  Service: Endoscopy;  Laterality: N/A;  . ESOPHAGOGASTRODUODENOSCOPY (EGD) WITH PROPOFOL N/A 11/16/2014   Procedure: ESOPHAGOGASTRODUODENOSCOPY (EGD) WITH PROPOFOL;  Surgeon: Josefine Class, MD;  Location: [facility] ENDOSCOPY;  Service: Endoscopy;  Laterality: N/A;    Prior to Admission medications   Medication Sig Start Date End Date Taking? Authorizing Provider  cyclobenzaprine (FLEXERIL) 10 MG tablet Take 10 mg by mouth 3 (three) times daily as needed for muscle spasms.   Yes [provider]  glipiZIDE (GLUCOTROL XL) 10 MG 24 hr tablet Take 10 mg by mouth daily.   Yes [provider]  hydrochlorothiazide (HYDRODIURIL) 25 MG tablet Take 25 mg by mouth daily.   Yes [provider]  linagliptin (TRADJENTA) 5 MG TABS tablet Take 5 mg by mouth daily.   Yes [provider]  lisinopril (PRINIVIL,ZESTRIL) 20 MG tablet Take 1 tablet (20 mg total) by mouth daily. Patient taking differently: Take 40 mg by mouth daily.  02/05/13  Yes Dhungel, Nishant, MD  magnesium oxide (MAG-OX) 400 MG tablet Take 400 mg by mouth 2 (two) times daily.   Yes [provider]  metFORMIN (GLUMETZA) 500 MG (MOD) 24 hr tablet Take 500 mg by mouth 2 (two) times daily with a meal.   Yes [provider]  metoprolol (LOPRESSOR) 50 MG tablet Take 50 mg by mouth 2 (two) times daily.   Yes [provider]  pantoprazole (PROTONIX) 40 MG tablet Take 1 tablet (40 mg total) by mouth daily. Patient taking differently: Take 20 mg by mouth daily.  02/05/13  Yes Dhungel, Nishant, MD  amLODipine (NORVASC) 10 MG tablet Take 1 tablet (10 mg total) by mouth daily. Patient not taking: Reported on  03/27/2017 02/05/13   Dhungel, Flonnie Overman, MD  ferrous sulfate (FERROUSUL) 325 (65 FE) MG tablet Take 1 tablet (325 mg total) by mouth 2 (two) times daily with a meal. Patient not taking: Reported on 03/27/2017 11/16/14   Vaughan Basta, MD    Allergies Patient has no known allergies.  No family history on file.  Social History Social History   Tobacco Use  . Smoking status: Never Smoker  . Smokeless tobacco: Never Used  Substance Use Topics  . Alcohol use: No  . Drug use: No    Review of Systems Constitutional: Borderline fever in triage, but no subjective fever/chills Eyes: No visual changes. ENT: No sore throat. Cardiovascular: Denies chest pain. Respiratory: Denies shortness of breath. Gastrointestinal: gradually worsening RLQ abd pain over the last two weeks, severe today.  Nausea, no vomiting.  Five soft (not watery) stools today.  No constipation. Genitourinary: Negative for dysuria. Musculoskeletal: Negative for neck pain.  Negative for back pain. Integumentary: Negative for rash. Neurological: Negative for headaches, focal weakness or numbness. Psychiatric:No complaints  ____________________________________________   PHYSICAL EXAM:  VITAL SIGNS: ED Triage Vitals  Enc  Vitals Group     BP 03/26/17 2252 137/68     Pulse Rate 03/26/17 2252 (!) 136     Resp 03/26/17 2252 20     Temp 03/26/17 2252 100.3 F (37.9 C)     Temp Source 03/26/17 2252 Oral     SpO2 03/26/17 2252 100 %     Weight 03/26/17 2253 99.8 kg (220 lb)     Height 03/26/17 2253 1.626 m (5\' 4" )     Head Circumference --      Peak Flow --      Pain Score 03/26/17 2252 9     Pain Loc --      Pain Edu? --      Excl. in [place]? --     Constitutional: Alert and oriented. Well appearing and in no acute distress. Eyes: Conjunctivae are normal.  Head: Atraumatic. Nose: No congestion/rhinnorhea. Mouth/Throat: Mucous membranes are moist. Neck: No stridor.  No meningeal signs.   Cardiovascular:  Normal rate, regular rhythm. Good peripheral circulation. Grossly normal heart sounds. Respiratory: Normal respiratory effort.  No retractions. Lungs CTAB. Gastrointestinal: Soft with diffuse tenderness to palpation throughout the abdomen, moderate in intensity, most notable in bilateral lower quadrants but present throughout.  She reports that the pain is tenderness is worse in the right lower quadrant.  No rebound and no guarding. Genitourinary: deferred  Musculoskeletal: No lower extremity tenderness nor edema. No gross deformities of extremities. Neurologic:  Normal speech and language. No gross focal neurologic deficits are appreciated.  Skin:  Skin is warm, dry and intact. No rash noted. Psychiatric: Mood and affect are normal. Speech and behavior are normal.  ____________________________________________   LABS (all labs ordered are listed, but only abnormal results are displayed)  Labs Reviewed  COMPREHENSIVE METABOLIC PANEL - Abnormal; Notable for the following components:      Result Value   Sodium 131 (*)    Potassium 7.3 (*)    CO2 16 (*)    Glucose, Bld 318 (*)    BUN 39 (*)    Creatinine, Ser 2.37 (*)    Calcium 7.8 (*)    Total Protein 8.2 (*)    ALT 13 (*)    GFR calc non Af Amer 23 (*)    GFR calc Af Amer 26 (*)    All other components within normal limits  CBC WITH DIFFERENTIAL/PLATELET - Abnormal; Notable for the following components:   WBC 15.0 (*)    RBC 3.08 (*)    Hemoglobin 9.2 (*)    HCT 28.4 (*)    Platelets 140 (*)    Neutro Abs 13.7 (*)    Lymphs Abs 0.8 (*)    All other components within normal limits  POTASSIUM - Abnormal; Notable for the following components:   Potassium 6.3 (*)    All other components within normal limits  MAGNESIUM - Abnormal; Notable for the following components:   Magnesium 0.9 (*)    All other components within normal limits  PROTIME-INR - Abnormal; Notable for the following components:   Prothrombin Time 23.9 (*)     All other components within normal limits  GLUCOSE, CAPILLARY - Abnormal; Notable for the following components:   Glucose-Capillary 207 (*)    All other components within normal limits  CULTURE, BLOOD (ROUTINE X 2)  CULTURE, BLOOD (ROUTINE X 2)  URINE CULTURE  LIPASE, BLOOD  LACTIC ACID, PLASMA  PROCALCITONIN  URINALYSIS, ROUTINE W REFLEX MICROSCOPIC  HCG, QUANTITATIVE, PREGNANCY  TROPONIN I  PREGNANCY, URINE  CBG MONITORING, ED   ____________________________________________  EKG  ED ECG REPORT I, Hinda Kehr, the attending physician, personally viewed and interpreted this ECG.  Date: 03/27/2017 EKG Time: 00: 15 Rate: 125 Rhythm: Sinus tachycardia QRS Axis: normal Intervals: normal ST/T Wave abnormalities: normal Narrative Interpretation: no evidence of acute ischemia  ____________________________________________  RADIOLOGY   ED MD interpretation:  Diarrheal illness (enteritis).  Large right ovarian mass consistent with teratoma.   Official radiology report(s): Ct Abdomen Pelvis Wo Contrast  Result Date: 03/27/2017 CLINICAL DATA:  52 year old female with abdominal pain. Concern for acute diverticulitis. EXAM: CT ABDOMEN AND PELVIS WITHOUT CONTRAST TECHNIQUE: Multidetector CT imaging of the abdomen and pelvis was performed following the standard protocol without IV contrast. COMPARISON:  Abdominal MRI dated 11/13/2014 and ultrasound dated 11/12/2014 FINDINGS: Evaluation of this exam is limited in the absence of intravenous contrast. Lower chest: The visualized lung bases are clear. No intra-abdominal free air or free fluid. Hepatobiliary: Probable mild fatty infiltration of the liver. Mild enlargement of the left lobe of the liver may represent cirrhosis. No intrahepatic biliary ductal dilatation. Cholecystectomy. Pancreas: Unremarkable. No pancreatic ductal dilatation or surrounding inflammatory changes. Spleen: The spleen is enlarged measuring up to 16 cm in greatest  length. Adrenals/Urinary Tract: The adrenal glands are unremarkable. There is no hydronephrosis or nephrolithiasis on either side. The visualized ureters and urinary bladder appear unremarkable. Stomach/Bowel: There is colonic diverticulosis without active inflammatory changes. Multiple normal caliber fluid-filled loops of small bowel noted which may be physiologic or represent enteritis. No bowel obstruction. Loose stool noted throughout the colon compatible with diarrheal state. Correlation with clinical exam and stool cultures recommended the appendix is normal. Vascular/Lymphatic: Mild aortoiliac atherosclerotic disease. The abdominal aorta and IVC are otherwise grossly unremarkable on this noncontrast CT. No portal venous gas. There is no adenopathy. Recanalized appearance of the paraumbilical vein suggestive of portal hypertension. Reproductive: The uterus is anteverted. Bilateral ovarian cysts measure up to 3.7 cm on the left. There is a complex and multi septated right adnexal mass measuring 7.6 x 6.5 x 7.9 cm. This mass predominantly composed of fatty tissue with a small focus of calcification most consistent with a teratoma. Please note this can predispose the ovary to an increased risk for torsion. If there is clinical concern for ovarian torsion further evaluation with pelvic ultrasound with duplex interrogation of ovarian flow recommended. Malignant transformation of teratoma is not evaluated on CT. Further evaluation with MRI or surgical consult to determine treatment options recommended. Other: None Musculoskeletal: No acute or significant osseous findings. IMPRESSION: 1. Diarrheal state with findings of possible enteritis. Correlation with clinical exam and stool cultures recommended no bowel obstruction. Normal appendix. 2. Colonic diverticulosis without active inflammatory changes. 3. 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 4. Right ovarian teratoma. Further  evaluation with MRI or surgical consult is advised. If there is clinical concern for right ovarian torsion further evaluation with pelvic ultrasound recommended. Electronically Signed   By: Anner Crete M.D.   On: 03/27/2017 02:14   Dg Chest Portable 1 View  Result Date: 03/27/2017 CLINICAL DATA:  Right lower abdominal pain, sepsis EXAM: PORTABLE CHEST 1 VIEW COMPARISON:  11/12/2014 FINDINGS: Heart and mediastinal contours are within normal limits. No focal opacities or effusions. No acute bony abnormality. IMPRESSION: No active disease. Electronically Signed   By: Rolm Baptise M.D.   On: 03/27/2017 00:55    ____________________________________________   PROCEDURES  Critical Care performed: Yes, see critical care procedure  note(s)   Procedure(s) performed:   .Critical Care Performed by: Hinda Kehr, MD Authorized by: Hinda Kehr, MD   Critical care provider statement:    Critical care time (minutes):  45   Critical care time was exclusive of:  Separately billable procedures and treating other patients   Critical care was necessary to treat or prevent imminent or life-threatening deterioration of the following conditions:  Sepsis and renal failure   Critical care was time spent personally by me on the following activities:  Development of treatment plan with patient or surrogate, discussions with consultants, evaluation of patient's response to treatment, examination of patient, obtaining history from patient or surrogate, ordering and performing treatments and interventions, ordering and review of laboratory studies, ordering and review of radiographic studies, pulse oximetry, re-evaluation of patient's condition and review of old charts     ____________________________________________   INITIAL IMPRESSION / [place] / ED COURSE  As part of my medical decision making, I reviewed the following data within the [place] notes reviewed and  incorporated, Labs reviewed , EKG interpreted , Old chart reviewed, Discussed with admitting physician  and Notes from prior ED visits    Differential diagnosis includes, but is not limited to, ovarian cyst, ovarian torsion, acute appendicitis, diverticulitis, urinary tract infection/pyelonephritis, endometriosis, bowel obstruction, colitis, renal colic, gastroenteritis, hernia, fibroids, endometriosis, pregnancy related pain including ectopic pregnancy, etc.  Unclear if the vaginal bleeding last month after at least several months if not years of amenorrhea is related.  She has not had any recent vaginal bleeding.  The gradually worsening symptoms over an extended period of time suggest a relatively slow process, possibly a mild but gradually worsening diverticulitis.  She was significantly tachycardic in triage but this may be related to the pain and discomfort of ambulating into the department and we will continue to monitor her vitals.  Checking standard labs including lactic acid, and planning for CT abd/pelvis.  Patient agrees with plan.   Clinical Course as of Mar 27 344  Tue Mar 27, 2017  0010 Lab called with a highly abnormal result of potassium 7.3.  I am going to send a repeat blood draw to verify the potassium as well as to check a magnesium.  Additionally her lab work indicates that she has a creatinine of 2.37 which is significantly greater than the 1.3 she has in 2016 which is the last lab work we have available.  She also has a leukocytosis of 15.  Given the acute renal failure with a GFR of 26, I will not give her IV contrast but we do need an abdominal CT which I will obtain without any contrast.  Lactic acid is currently pending and I have ordered an EKG, 1L NS, and repeat potassium and magnesium levels. Potassium: (!!) 7.3 [CF]  0021 Creatinine: (!) 2.37 [CF]  0021 Anion gap: 9 [CF]  0034 within normal limits Lactic Acid, Venous: 1.6 [CF]  0059 The patient's repeat potassium is  6.3 and she has a magnesium of 0.9.  I do not understand why her potassium would have been reported as 7.3 the first time and six-point through the second with no IV fluids in between, but the fact remains that her potassium is significantly elevated in the setting of acute renal failure and probable infectious process.  She does meet sepsis criteria and I have initiated code sepsis although her lactic acid is normal and she is not hypotensive so she has neither severe  sepsis and or septic shock.I will treat with a plan for 2 L of normal saline at this time with cefepime and Flagyl for presumed intra-abdominal infection; I chose this regimen from the ED antibiotics order set as opposed to Zosyn because of the patient's acute renal failure and I have been told in the past that cefepime and Flagyl are better choices than Zosyn in the setting of renal issues.  CT scan is pending.  I am also initiating treatment for her hyperkalemia per protocol. Potassium: (!!) 6.3 [CF]  0102 I checked the prior call for hyperkalemia treatment, and since her potassium is 6.3 as opposed to greater than 6.5, I am going to be relatively conservative with the treatment and anticipate repeat testing after IV fluids.  I will give her 5 units of regular insulin with no glucose as per the recommended protocol given that her glucose is currently 318 and we will check a fingerstick blood sugar 1 hour after administration of the insulin.  This will help with cation exchange.  Will also give calcium gluconate by IV for myocardial stabilization.  Repleting magnesium as well.  [CF]  0092 CT scan negative of a diarrhea illness such as a nonspecific enteritis  [CF]  [phone] Coordinated with Dr. Marcille Blanco -the plan is for the patient to get her GYN ultrasound while down in the emergency department but then preferably go directly to her bed assignment once it is available.  But this will facilitate the planned GYN follow-up for the right ovarian  teratoma  [CF]    Clinical Course User Index [CF] Hinda Kehr, MD    ____________________________________________  FINAL CLINICAL IMPRESSION(S) / ED DIAGNOSES  Final diagnoses:  Acute renal failure, unspecified acute renal failure type ([place])  Sepsis, due to unspecified organism ([place])  Diarrhea, unspecified type  RLQ abdominal pain  Ovarian teratoma, right  Hyperkalemia     MEDICATIONS GIVEN DURING THIS VISIT:  Medications  ceFEPIme (MAXIPIME) 1 g in sodium chloride 0.9 % 100 mL IVPB (0 g Intravenous Stopped 03/27/17 0204)    And  metroNIDAZOLE (FLAGYL) IVPB 500 mg (500 mg Intravenous New Bag/Given 03/27/17 0247)  albuterol (PROVENTIL) (2.5 MG/3ML) 0.083% nebulizer solution 2.5 mg (not administered)  sodium chloride 0.9 % bolus 1,000 mL (0 mLs Intravenous Stopped 03/27/17 0205)  morphine 4 MG/ML injection 4 mg (4 mg Intravenous Given 03/27/17 0112)  ondansetron (ZOFRAN) injection 4 mg (4 mg Intravenous Given 03/27/17 0110)  sodium chloride 0.9 % bolus 1,000 mL (0 mLs Intravenous Stopped 03/27/17 0305)  calcium gluconate 1 g in sodium chloride 0.9 % 100 mL IVPB (0 g Intravenous Stopped 03/27/17 0244)  insulin aspart (novoLOG) injection 5 Units (5 Units Intravenous Given 03/27/17 0218)  magnesium sulfate IVPB 2 g 50 mL (0 g Intravenous Stopped 03/27/17 7622)     ED Discharge Orders    None       Note:  This document was prepared using Dragon voice recognition software and may include unintentional dictation errors.    Hinda Kehr, MD 03/27/17 [phone]

## 2017-03-26 NOTE — ED Triage Notes (Signed)
Pt presents to ED with right lower abd pain for the past 2 weeks. Denies vomiting. Diarrhea X5 today. No hx of the same.

## 2017-03-27 ENCOUNTER — Emergency Department: Payer: Self-pay

## 2017-03-27 ENCOUNTER — Inpatient Hospital Stay: Payer: Self-pay

## 2017-03-27 ENCOUNTER — Other Ambulatory Visit: Payer: Self-pay

## 2017-03-27 DIAGNOSIS — N179 Acute kidney failure, unspecified: Secondary | ICD-10-CM | POA: Diagnosis present

## 2017-03-27 LAB — HCG, QUANTITATIVE, PREGNANCY: HCG, BETA CHAIN, QUANT, S: 1 m[IU]/mL (ref <5)

## 2017-03-27 LAB — POTASSIUM
POTASSIUM: 6.3 mmol/L — AB (ref 3.5–5.1)
Potassium: 5.2 mmol/L — ABNORMAL HIGH (ref 3.5–5.1)

## 2017-03-27 LAB — URINALYSIS, ROUTINE W REFLEX MICROSCOPIC
BILIRUBIN URINE: NEGATIVE
Glucose, UA: NEGATIVE mg/dL
Hgb urine dipstick: NEGATIVE
Ketones, ur: NEGATIVE mg/dL
LEUKOCYTES UA: NEGATIVE
NITRITE: NEGATIVE
Protein, ur: NEGATIVE mg/dL
SPECIFIC GRAVITY, URINE: 1.011 (ref 1.005–1.030)
pH: 5 (ref 5.0–8.0)

## 2017-03-27 LAB — BASIC METABOLIC PANEL
Anion gap: 8 (ref 5–15)
BUN: 37 mg/dL — ABNORMAL HIGH (ref 6–20)
CALCIUM: 7.3 mg/dL — AB (ref 8.9–10.3)
CO2: 13 mmol/L — ABNORMAL LOW (ref 22–32)
Chloride: 114 mmol/L — ABNORMAL HIGH (ref 101–111)
Creatinine, Ser: 1.98 mg/dL — ABNORMAL HIGH (ref 0.44–1.00)
GFR, EST AFRICAN AMERICAN: 33 mL/min — AB (ref >60)
GFR, EST NON AFRICAN AMERICAN: 28 mL/min — AB (ref >60)
Glucose, Bld: 241 mg/dL — ABNORMAL HIGH (ref 65–99)
Potassium: 6.6 mmol/L (ref 3.5–5.1)
SODIUM: 135 mmol/L (ref 135–145)

## 2017-03-27 LAB — TSH: TSH: 2.625 u[IU]/mL (ref 0.350–4.500)

## 2017-03-27 LAB — GLUCOSE, CAPILLARY
Glucose-Capillary: 207 mg/dL — ABNORMAL HIGH (ref 65–99)
Glucose-Capillary: 232 mg/dL — ABNORMAL HIGH (ref 65–99)
Glucose-Capillary: 252 mg/dL — ABNORMAL HIGH (ref 65–99)
Glucose-Capillary: 199 mg/dL — ABNORMAL HIGH (ref 65–99)
Glucose-Capillary: 190 mg/dL — ABNORMAL HIGH (ref 65–99)

## 2017-03-27 LAB — LACTIC ACID, PLASMA: Lactic Acid, Venous: 1.6 mmol/L (ref 0.5–1.9)

## 2017-03-27 LAB — PROTIME-INR
INR: 2.16
PROTHROMBIN TIME: 23.9 s — AB (ref 11.4–15.2)

## 2017-03-27 LAB — TROPONIN I: Troponin I: <0.03 ng/mL (ref <0.03)

## 2017-03-27 LAB — MAGNESIUM: MAGNESIUM: 0.9 mg/dL — AB (ref 1.7–2.4)

## 2017-03-27 LAB — PROCALCITONIN: Procalcitonin: 4.5 ng/mL

## 2017-03-27 LAB — PREGNANCY, URINE: Preg Test, Ur: NEGATIVE

## 2017-03-27 MED ORDER — CYCLOBENZAPRINE HCL 10 MG PO TABS
10.0000 mg | ORAL_TABLET | Freq: Three times a day (TID) | ORAL | Status: DC | PRN
  Filled 2017-03-27: qty 1

## 2017-03-27 MED ORDER — METRONIDAZOLE IN NACL 5-0.79 MG/ML-% IV SOLN
500.0000 mg | Freq: Once | INTRAVENOUS | Status: AC
  Administered 2017-03-27: 500 mg via INTRAVENOUS
  Filled 2017-03-27: qty 100

## 2017-03-27 MED ORDER — INSULIN ASPART 100 UNIT/ML ~~LOC~~ SOLN
5.0000 [IU] | Freq: Once | SUBCUTANEOUS | Status: AC
  Administered 2017-03-27: 5 [IU] via INTRAVENOUS
  Filled 2017-03-27: qty 1

## 2017-03-27 MED ORDER — METOPROLOL TARTRATE 50 MG PO TABS
50.0000 mg | ORAL_TABLET | Freq: Two times a day (BID) | ORAL | Status: DC
  Administered 2017-03-27 – 2017-04-01 (×11): 50 mg via ORAL
  Filled 2017-03-27 (×14): qty 1

## 2017-03-27 MED ORDER — SODIUM CHLORIDE 0.9 % IV SOLN
2.0000 g | INTRAVENOUS | Status: DC
  Administered 2017-03-27 – 2017-03-31 (×5): 2 g via INTRAVENOUS
  Filled 2017-03-27 (×7): qty 20

## 2017-03-27 MED ORDER — SODIUM CHLORIDE 0.9 % IV BOLUS (SEPSIS)
1000.0000 mL | INTRAVENOUS | Status: AC
  Administered 2017-03-27: 1000 mL via INTRAVENOUS

## 2017-03-27 MED ORDER — INSULIN ASPART 100 UNIT/ML ~~LOC~~ SOLN
0.0000 [IU] | Freq: Three times a day (TID) | SUBCUTANEOUS | Status: DC
  Administered 2017-03-27: 12:00:00 5 [IU] via SUBCUTANEOUS
  Administered 2017-03-27: 3 [IU] via SUBCUTANEOUS
  Administered 2017-03-27 – 2017-03-28 (×2): 2 [IU] via SUBCUTANEOUS
  Filled 2017-03-27 (×5): qty 1

## 2017-03-27 MED ORDER — PANTOPRAZOLE SODIUM 40 MG PO PACK
20.0000 mg | PACK | Freq: Every day | ORAL | Status: DC
  Administered 2017-03-28: 20 mg via ORAL
  Filled 2017-03-27 (×3): qty 20

## 2017-03-27 MED ORDER — SODIUM CHLORIDE 0.9 % IV BOLUS (SEPSIS)
1000.0000 mL | Freq: Once | INTRAVENOUS | Status: AC
  Administered 2017-03-27: 1000 mL via INTRAVENOUS

## 2017-03-27 MED ORDER — HEPARIN SODIUM (PORCINE) 5000 UNIT/ML IJ SOLN
5000.0000 [IU] | Freq: Three times a day (TID) | INTRAMUSCULAR | Status: DC
  Filled 2017-03-27 (×3): qty 1

## 2017-03-27 MED ORDER — LINAGLIPTIN 5 MG PO TABS
5.0000 mg | ORAL_TABLET | Freq: Every day | ORAL | Status: DC
  Administered 2017-03-27: 5 mg via ORAL
  Filled 2017-03-27: qty 1

## 2017-03-27 MED ORDER — SODIUM CHLORIDE 0.9 % IV SOLN
1.0000 g | Freq: Once | INTRAVENOUS | Status: AC
  Administered 2017-03-27: 1 g via INTRAVENOUS
  Filled 2017-03-27: qty 10

## 2017-03-27 MED ORDER — MAGNESIUM OXIDE 400 (241.3 MG) MG PO TABS
400.0000 mg | ORAL_TABLET | Freq: Two times a day (BID) | ORAL | Status: DC
  Administered 2017-03-28 – 2017-03-29 (×3): 400 mg via ORAL
  Filled 2017-03-27 (×3): qty 1

## 2017-03-27 MED ORDER — SODIUM CHLORIDE 0.9 % IV SOLN
INTRAVENOUS | Status: DC
  Administered 2017-03-27 – 2017-03-29 (×6): via INTRAVENOUS

## 2017-03-27 MED ORDER — MAGNESIUM SULFATE 2 GM/50ML IV SOLN
2.0000 g | Freq: Once | INTRAVENOUS | Status: AC
  Administered 2017-03-27: 2 g via INTRAVENOUS
  Filled 2017-03-27: qty 50

## 2017-03-27 MED ORDER — HYDROCHLOROTHIAZIDE 25 MG PO TABS
25.0000 mg | ORAL_TABLET | Freq: Every day | ORAL | Status: DC
  Administered 2017-03-27: 10:00:00 25 mg via ORAL
  Filled 2017-03-27: qty 1

## 2017-03-27 MED ORDER — MORPHINE SULFATE (PF) 2 MG/ML IV SOLN
2.0000 mg | INTRAVENOUS | Status: DC | PRN
  Administered 2017-03-27 (×3): 4 mg via INTRAVENOUS
  Administered 2017-03-28: 2 mg via INTRAVENOUS
  Administered 2017-03-28 – 2017-03-29 (×3): 4 mg via INTRAVENOUS
  Administered 2017-03-30: 2 mg via INTRAVENOUS
  Filled 2017-03-27: qty 2
  Filled 2017-03-27: qty 1
  Filled 2017-03-27 (×2): qty 2
  Filled 2017-03-27: qty 1
  Filled 2017-03-27 (×3): qty 2

## 2017-03-27 MED ORDER — ACETAMINOPHEN 325 MG PO TABS: 650.0000 mg | ORAL_TABLET | Freq: Four times a day (QID) | ORAL | Status: DC | PRN

## 2017-03-27 MED ORDER — ONDANSETRON HCL 4 MG PO TABS: 4.0000 mg | ORAL_TABLET | Freq: Four times a day (QID) | ORAL | Status: DC | PRN

## 2017-03-27 MED ORDER — MORPHINE SULFATE (PF) 4 MG/ML IV SOLN
4.0000 mg | Freq: Once | INTRAVENOUS | Status: AC
  Administered 2017-03-27: 4 mg via INTRAVENOUS
  Filled 2017-03-27: qty 1

## 2017-03-27 MED ORDER — MORPHINE SULFATE (PF) 2 MG/ML IV SOLN
2.0000 mg | INTRAVENOUS | Status: AC
  Administered 2017-03-27: 2 mg via INTRAVENOUS
  Filled 2017-03-27: qty 1

## 2017-03-27 MED ORDER — METRONIDAZOLE IN NACL 5-0.79 MG/ML-% IV SOLN
500.0000 mg | Freq: Three times a day (TID) | INTRAVENOUS | Status: DC
  Administered 2017-03-27 – 2017-04-01 (×15): 500 mg via INTRAVENOUS
  Filled 2017-03-27 (×20): qty 100

## 2017-03-27 MED ORDER — SODIUM CHLORIDE 0.9 % IV SOLN
1.0000 g | Freq: Once | INTRAVENOUS | Status: AC
  Administered 2017-03-27: 1 g via INTRAVENOUS
  Filled 2017-03-27: qty 1

## 2017-03-27 MED ORDER — ALBUTEROL SULFATE (2.5 MG/3ML) 0.083% IN NEBU
2.5000 mg | INHALATION_SOLUTION | RESPIRATORY_TRACT | Status: DC
Start: 2017-03-27 — End: 2017-03-28

## 2017-03-27 MED ORDER — ONDANSETRON HCL 4 MG/2ML IJ SOLN
4.0000 mg | Freq: Four times a day (QID) | INTRAMUSCULAR | Status: DC | PRN
Start: 2017-03-27 — End: 2017-04-01
  Administered 2017-03-28 – 2017-03-31 (×6): 4 mg via INTRAVENOUS
  Filled 2017-03-27 (×8): qty 2

## 2017-03-27 MED ORDER — ONDANSETRON HCL 4 MG/2ML IJ SOLN
4.0000 mg | INTRAMUSCULAR | Status: AC
  Administered 2017-03-27: 4 mg via INTRAVENOUS
  Filled 2017-03-27: qty 2

## 2017-03-27 MED ORDER — INSULIN GLARGINE 100 UNIT/ML ~~LOC~~ SOLN
10.0000 [IU] | Freq: Every day | SUBCUTANEOUS | Status: DC
  Administered 2017-03-27: 14:00:00 10 [IU] via SUBCUTANEOUS
  Filled 2017-03-27 (×3): qty 0.1

## 2017-03-27 MED ORDER — DOCUSATE SODIUM 100 MG PO CAPS
100.0000 mg | ORAL_CAPSULE | Freq: Two times a day (BID) | ORAL | Status: DC
  Administered 2017-03-27 – 2017-03-28 (×3): 100 mg via ORAL
  Filled 2017-03-27 (×3): qty 1

## 2017-03-27 MED ORDER — ACETAMINOPHEN 650 MG RE SUPP: 650.0000 mg | Freq: Four times a day (QID) | RECTAL | Status: DC | PRN

## 2017-03-27 MED ORDER — SODIUM POLYSTYRENE SULFONATE PO POWD
30.0000 g | Freq: Once | ORAL | Status: AC
  Administered 2017-03-27: 12:00:00 30 g via ORAL
  Filled 2017-03-27 (×2): qty 30

## 2017-03-27 NOTE — Progress Notes (Signed)
Dr Margaretmary Eddy notified that patient has potassium of 6.6.

## 2017-03-27 NOTE — Progress Notes (Signed)
[name], female   DOB: Feb 09, 1965, 52 y.o.   MRN: [id number] Consultation note dictated . Right ovarian mass c/w mature teratoma. Recommend surgery once medical condition has improved/ stabilized

## 2017-03-27 NOTE — Plan of Care (Signed)
  Progressing Education: Knowledge of General Education information will improve 03/27/2017 1852 - Progressing by Daylene Posey, RN Clinical Measurements: Ability to maintain clinical measurements within normal limits will improve 03/27/2017 1852 - Progressing by Daylene Posey, RN Will remain free from infection 03/27/2017 1852 - Progressing by Daylene Posey, RN Diagnostic test results will improve 03/27/2017 1852 - Progressing by Daylene Posey, RN Cardiovascular complication will be avoided 03/27/2017 1852 - Progressing by Daylene Posey, RN Pain Managment: General experience of comfort will improve 03/27/2017 1852 - Progressing by Daylene Posey, RN

## 2017-03-27 NOTE — Progress Notes (Signed)
CODE SEPSIS - PHARMACY COMMUNICATION  **Broad Spectrum Antibiotics should be administered within 1 hour of Sepsis diagnosis**  Time Code Sepsis Called/Page Received: n/a  Antibiotics Ordered: cefepime/flagyl  Time of 1st antibiotic administration: 0128  Additional action taken by pharmacy:   If necessary, Name of Provider/Nurse Contacted:     Tobie Lords ,PharmD Clinical Pharmacist  03/27/2017  4:07 AM

## 2017-03-27 NOTE — H&P (Signed)
Selena Bennett is an 52 y.o. female.   Chief Complaint: Abdominal pain HPI: Patient with past medical history of diabetes, hypertension and history of cirrhosis presents to the emergency department complaining of abdominal pain.  The patient states her abdomen has been hurting for more than a month.  At the beginning of that time she began to have postmenopausal bleeding as well.  Her primary care doctor had made her an appointment with OB/GYN for this week but the patient's pain became too great to bear.  She also admits to diarrhea that began today.  She denies fever but admits to nausea.  In the emergency department the patient was found to have an ovarian mass and inflammatory bowel consistent with Esther enteritis on CT scan.  She was given broad-spectrum antibiotics prior to the emergency department staff calling the hospitalist service for admission.  Past Medical History:  Diagnosis Date  . Anemia    [facility] 02-04-13 acute blood loss  . Cirrhosis ([place])   . Diabetes ([place])   . Esophageal varices with hemorrhage ([place]) 01/30/2013  . Fatty liver   . GERD (gastroesophageal reflux disease)   . Hypertension     Past Surgical History:  Procedure Laterality Date  . C section x1     1989  . CHOLECYSTECTOMY    . CHOLECYSTECTOMY OPEN    . ESOPHAGEAL BANDING N/A 02/21/2013   Procedure: ESOPHAGEAL BANDING;  Surgeon: Beryle Beams, MD;  Location: [facility] ENDOSCOPY;  Service: Endoscopy;  Laterality: N/A;  . ESOPHAGEAL BANDING N/A 03/21/2013   Procedure: ESOPHAGEAL BANDING;  Surgeon: Beryle Beams, MD;  Location: [facility] ENDOSCOPY;  Service: Endoscopy;  Laterality: N/A;  . ESOPHAGOGASTRODUODENOSCOPY N/A 01/28/2013   Procedure: ESOPHAGOGASTRODUODENOSCOPY (EGD);  Surgeon: Beryle Beams, MD;  Location: [facility] ENDOSCOPY;  Service: Endoscopy;  Laterality: N/A;  bedside  . ESOPHAGOGASTRODUODENOSCOPY N/A 02/21/2013   Procedure: ESOPHAGOGASTRODUODENOSCOPY (EGD);  Surgeon: Beryle Beams, MD;  Location: [facility];   Service: Endoscopy;  Laterality: N/A;  . ESOPHAGOGASTRODUODENOSCOPY N/A 03/21/2013   Procedure: ESOPHAGOGASTRODUODENOSCOPY (EGD);  Surgeon: Beryle Beams, MD;  Location: [facility] ENDOSCOPY;  Service: Endoscopy;  Laterality: N/A;  . ESOPHAGOGASTRODUODENOSCOPY N/A 04/22/2013   Procedure: ESOPHAGOGASTRODUODENOSCOPY (EGD);  Surgeon: Beryle Beams, MD;  Location: [facility] ENDOSCOPY;  Service: Endoscopy;  Laterality: N/A;  . ESOPHAGOGASTRODUODENOSCOPY (EGD) WITH PROPOFOL N/A 11/16/2014   Procedure: ESOPHAGOGASTRODUODENOSCOPY (EGD) WITH PROPOFOL;  Surgeon: Josefine Class, MD;  Location: [facility] ENDOSCOPY;  Service: Endoscopy;  Laterality: N/A;    History reviewed. No pertinent family history. Social History:  reports that  has never smoked. she has never used smokeless tobacco. She reports that she does not drink alcohol or use drugs.  Allergies: No Known Allergies  Medications Prior to Admission  Medication Sig Dispense Refill  . cyclobenzaprine (FLEXERIL) 10 MG tablet Take 10 mg by mouth 3 (three) times daily as needed for muscle spasms.    Marland Kitchen glipiZIDE (GLUCOTROL XL) 10 MG 24 hr tablet Take 10 mg by mouth daily.    . hydrochlorothiazide (HYDRODIURIL) 25 MG tablet Take 25 mg by mouth daily.    Marland Kitchen linagliptin (TRADJENTA) 5 MG TABS tablet Take 5 mg by mouth daily.    Marland Kitchen lisinopril (PRINIVIL,ZESTRIL) 20 MG tablet Take 1 tablet (20 mg total) by mouth daily. (Patient taking differently: Take 40 mg by mouth daily. ) 30 tablet 0  . magnesium oxide (MAG-OX) 400 MG tablet Take 400 mg by mouth 2 (two) times daily.    . metFORMIN (GLUMETZA) 500 MG (  MOD) 24 hr tablet Take 500 mg by mouth 2 (two) times daily with a meal.    . metoprolol (LOPRESSOR) 50 MG tablet Take 50 mg by mouth 2 (two) times daily.    . pantoprazole (PROTONIX) 40 MG tablet Take 1 tablet (40 mg total) by mouth daily. (Patient taking differently: Take 20 mg by mouth daily. ) 30 tablet 0  . amLODipine (NORVASC) 10 MG tablet Take 1 tablet (10 mg total) by  mouth daily. (Patient not taking: Reported on 03/27/2017) 30 tablet 0  . ferrous sulfate (FERROUSUL) 325 (65 FE) MG tablet Take 1 tablet (325 mg total) by mouth 2 (two) times daily with a meal. (Patient not taking: Reported on 03/27/2017) 60 tablet 3    Results for orders placed or performed during the hospital encounter of 03/26/17 (from the past 48 hour(s))  Lipase, blood     Status: None   Collection Time: 03/26/17 11:27 PM  Result Value Ref Range   Lipase 34 11 - 51 U/L    Comment: Performed at [facility], [address] [place], [place] [address]  Comprehensive metabolic panel     Status: Abnormal   Collection Time: 03/26/17 11:27 PM  Result Value Ref Range   Sodium 131 (L) 135 - 145 mmol/L   Potassium 7.3 (HH) 3.5 - 5.1 mmol/L    Comment: CRITICAL RESULT CALLED TO, READ BACK BY AND VERIFIED WITH BUTCH WOODS RN AT 0005 03/27/17.MSS    Chloride 106 101 - 111 mmol/L   CO2 16 (L) 22 - 32 mmol/L   Glucose, Bld 318 (H) 65 - 99 mg/dL   BUN 39 (H) 6 - 20 mg/dL   Creatinine, Ser 2.37 (H) 0.44 - 1.00 mg/dL   Calcium 7.8 (L) 8.9 - 10.3 mg/dL   Total Protein 8.2 (H) 6.5 - 8.1 g/dL   Albumin 3.6 3.5 - 5.0 g/dL   AST 29 15 - 41 U/L   ALT 13 (L) 14 - 54 U/L   Alkaline Phosphatase 83 38 - 126 U/L   Total Bilirubin 1.1 0.3 - 1.2 mg/dL   GFR calc non Af Amer 23 (L) >60 mL/min   GFR calc Af Amer 26 (L) >60 mL/min    Comment: (NOTE) The eGFR has been calculated using the CKD EPI equation. This calculation has not been validated in all clinical situations. eGFR's persistently <60 mL/min signify possible Chronic Kidney Disease.    Anion gap 9 5 - 15    Comment: Performed at [facility], [address] [place], [place] [address]  Lactic acid, plasma     Status: None   Collection Time: 03/26/17 11:27 PM  Result Value Ref Range   Lactic Acid, Venous 1.6 0.5 - 1.9 mmol/L    Comment: Performed at [facility], [address] [place], [place] [address]  CBC  with Differential/Platelet     Status: Abnormal   Collection Time: 03/26/17 11:27 PM  Result Value Ref Range   WBC 15.0 (H) 3.6 - 11.0 K/uL   RBC 3.08 (L) 3.80 - 5.20 MIL/uL   Hemoglobin 9.2 (L) 12.0 - 16.0 g/dL   HCT 28.4 (L) 35.0 - 47.0 %   MCV 92.2 80.0 - 100.0 fL   MCH 29.9 26.0 - 34.0 pg   MCHC 32.4 32.0 - 36.0 g/dL   RDW 13.8 11.5 - 14.5 %   Platelets 140 (L) 150 - 440 K/uL   Neutrophils Relative % 92 %   Neutro Abs 13.7 (H)  1.4 - 6.5 K/uL   Lymphocytes Relative 5 %   Lymphs Abs 0.8 (L) 1.0 - 3.6 K/uL   Monocytes Relative 3 %   Monocytes Absolute 0.5 0.2 - 0.9 K/uL   Eosinophils Relative 0 %   Eosinophils Absolute 0.0 0 - 0.7 K/uL   Basophils Relative 0 %   Basophils Absolute 0.0 0 - 0.1 K/uL    Comment: Performed at [facility], [address] [place], [place] [address]  Potassium     Status: Abnormal   Collection Time: 03/27/17 12:18 AM  Result Value Ref Range   Potassium 6.3 (HH) 3.5 - 5.1 mmol/L    Comment: CRITICAL RESULT CALLED TO, READ BACK BY AND VERIFIED WITH BUTCH WOODS RN AT [phone] 03/27/17.MSS Performed at [facility], [address] [place], [place] [address]   Magnesium     Status: Abnormal   Collection Time: 03/27/17 12:18 AM  Result Value Ref Range   Magnesium 0.9 (LL) 1.7 - 2.4 mg/dL    Comment: CRITICAL RESULT CALLED TO, READ BACK BY AND VERIFIED WITH BUTCH WOODS RN AT [phone] 03/27/17. MSS Performed at [facility], [address] [place], [place] [address]   Procalcitonin     Status: None   Collection Time: 03/27/17  2:06 AM  Result Value Ref Range   Procalcitonin 4.50 ng/mL    Comment:        Interpretation: PCT > 2 ng/mL: Systemic infection (sepsis) is likely, unless other causes are known. (NOTE)       Sepsis PCT Algorithm           Lower Respiratory Tract                                      Infection PCT Algorithm    ----------------------------     ----------------------------         PCT < 0.25 ng/mL                 PCT < 0.10 ng/mL         Strongly encourage             Strongly discourage   discontinuation of antibiotics    initiation of antibiotics    ----------------------------     -----------------------------       PCT 0.25 - 0.50 ng/mL            PCT 0.10 - 0.25 ng/mL               OR       >80% decrease in PCT            Discourage initiation of                                            antibiotics      Encourage discontinuation           of antibiotics    ----------------------------     -----------------------------         PCT >= 0.50 ng/mL              PCT 0.26 - 0.50 ng/mL               AND       <80% decrease in PCT  Encourage initiation of                                             antibiotics       Encourage continuation           of antibiotics    ----------------------------     -----------------------------        PCT >= 0.50 ng/mL                  PCT > 0.50 ng/mL               AND         increase in PCT                  Strongly encourage                                      initiation of antibiotics    Strongly encourage escalation           of antibiotics                                     -----------------------------                                           PCT <= 0.25 ng/mL                                                 OR                                        > 80% decrease in PCT                                     Discontinue / Do not initiate                                             antibiotics Performed at [facility], [address] [place], [place] [address]   Protime-INR     Status: Abnormal   Collection Time: 03/27/17  2:06 AM  Result Value Ref Range   Prothrombin Time 23.9 (H) 11.4 - 15.2 seconds   INR 2.16     Comment: Performed at [facility], [address] [place], [place] [address]  Urinalysis, Routine w reflex microscopic     Status: Abnormal   Collection Time: 03/27/17  2:22 AM  Result  Value Ref Range   Color, Urine YELLOW (A) YELLOW   APPearance CLEAR (A) CLEAR   Specific Gravity, Urine 1.011 1.005 - 1.030   pH 5.0 5.0 - 8.0   Glucose, UA NEGATIVE NEGATIVE  mg/dL   Hgb urine dipstick NEGATIVE NEGATIVE   Bilirubin Urine NEGATIVE NEGATIVE   Ketones, ur NEGATIVE NEGATIVE mg/dL   Protein, ur NEGATIVE NEGATIVE mg/dL   Nitrite NEGATIVE NEGATIVE   Leukocytes, UA NEGATIVE NEGATIVE    Comment: Performed at [facility], [address] [place], [place] [address]  Pregnancy, urine     Status: None   Collection Time: 03/27/17  2:22 AM  Result Value Ref Range   Preg Test, Ur NEGATIVE NEGATIVE    Comment: Performed at [facility], [address] [place], [place] [address]  Glucose, capillary     Status: Abnormal   Collection Time: 03/27/17  3:39 AM  Result Value Ref Range   Glucose-Capillary 207 (H) 65 - 99 mg/dL   Ct Abdomen Pelvis Wo Contrast  Result Date: 03/27/2017 CLINICAL DATA:  52 year old female with abdominal pain. Concern for acute diverticulitis. EXAM: CT ABDOMEN AND PELVIS WITHOUT CONTRAST TECHNIQUE: Multidetector CT imaging of the abdomen and pelvis was performed following the standard protocol without IV contrast. COMPARISON:  Abdominal MRI dated 11/13/2014 and ultrasound dated 11/12/2014 FINDINGS: Evaluation of this exam is limited in the absence of intravenous contrast. Lower chest: The visualized lung bases are clear. No intra-abdominal free air or free fluid. Hepatobiliary: Probable mild fatty infiltration of the liver. Mild enlargement of the left lobe of the liver may represent cirrhosis. No intrahepatic biliary ductal dilatation. Cholecystectomy. Pancreas: Unremarkable. No pancreatic ductal dilatation or surrounding inflammatory changes. Spleen: The spleen is enlarged measuring up to 16 cm in greatest length. Adrenals/Urinary Tract: The adrenal glands are unremarkable. There is no hydronephrosis or nephrolithiasis on either side. The  visualized ureters and urinary bladder appear unremarkable. Stomach/Bowel: There is colonic diverticulosis without active inflammatory changes. Multiple normal caliber fluid-filled loops of small bowel noted which may be physiologic or represent enteritis. No bowel obstruction. Loose stool noted throughout the colon compatible with diarrheal state. Correlation with clinical exam and stool cultures recommended the appendix is normal. Vascular/Lymphatic: Mild aortoiliac atherosclerotic disease. The abdominal aorta and IVC are otherwise grossly unremarkable on this noncontrast CT. No portal venous gas. There is no adenopathy. Recanalized appearance of the paraumbilical vein suggestive of portal hypertension. Reproductive: The uterus is anteverted. Bilateral ovarian cysts measure up to 3.7 cm on the left. There is a complex and multi septated right adnexal mass measuring 7.6 x 6.5 x 7.9 cm. This mass predominantly composed of fatty tissue with a small focus of calcification most consistent with a teratoma. Please note this can predispose the ovary to an increased risk for torsion. If there is clinical concern for ovarian torsion further evaluation with pelvic ultrasound with duplex interrogation of ovarian flow recommended. Malignant transformation of teratoma is not evaluated on CT. Further evaluation with MRI or surgical consult to determine treatment options recommended. Other: None Musculoskeletal: No acute or significant osseous findings. IMPRESSION: 1. Diarrheal state with findings of possible enteritis. Correlation with clinical exam and stool cultures recommended no bowel obstruction. Normal appendix. 2. Colonic diverticulosis without active inflammatory changes. 3. 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 4. Right ovarian teratoma. Further evaluation with MRI or surgical consult is advised. If there is clinical concern for right ovarian torsion further evaluation with pelvic  ultrasound recommended. Electronically Signed   By: Anner Crete M.D.   On: 03/27/2017 02:14   US Pelvis Complete  Result Date: 03/27/2017 CLINICAL DATA:  52 y/o  F; right pelvic pain for 2 weeks. EXAM: TRANSABDOMINAL ULTRASOUND OF PELVIS  DOPPLER ULTRASOUND OF OVARIES TECHNIQUE: Transabdominal ultrasound examination of the pelvis was performed including evaluation of the uterus, ovaries, adnexal regions, and pelvic cul-de-sac. Color and duplex Doppler ultrasound was utilized to evaluate blood flow to the ovaries. COMPARISON:  03/27/2017 CT abdomen and pelvis. FINDINGS: Uterus Measurements: 10.5 x 5.5 x 5.2 cm. No fibroids or other mass visualized. Endometrium Thickness: 12 mm.  Poorly visualized. Right ovary Measurements: 9.1 x 5.1 x 9.4 cm. Enlarged complex ovarian mass with solid central component measuring up to 5.7 cm on ultrasound, partially visualized, corresponding to teratoma on CT. Left ovary Measurements: 5.2 x 3.7 x 3.8 cm. Small avascular simple appearing cysts. Pulsed Doppler evaluation demonstrates normal low-resistance arterial and venous waveforms in both ovaries. IMPRESSION: 1. Patient refused transvaginal exam. Exam limited by body habitus and bowel gas shadow. 2. No evidence of ovarian torsion. 3. Right ovarian mass corresponding to teratoma on prior CT. Electronically Signed   By: Kristine Garbe M.D.   On: 03/27/2017 05:56   [place] Art/ven Flow Abd Pelv Doppler  Result Date: 03/27/2017 CLINICAL DATA:  52 y/o  F; right pelvic pain for 2 weeks. EXAM: TRANSABDOMINAL ULTRASOUND OF PELVIS DOPPLER ULTRASOUND OF OVARIES TECHNIQUE: Transabdominal ultrasound examination of the pelvis was performed including evaluation of the uterus, ovaries, adnexal regions, and pelvic cul-de-sac. Color and duplex Doppler ultrasound was utilized to evaluate blood flow to the ovaries. COMPARISON:  03/27/2017 CT abdomen and pelvis. FINDINGS: Uterus Measurements: 10.5 x 5.5 x 5.2 cm. No fibroids or other  mass visualized. Endometrium Thickness: 12 mm.  Poorly visualized. Right ovary Measurements: 9.1 x 5.1 x 9.4 cm. Enlarged complex ovarian mass with solid central component measuring up to 5.7 cm on ultrasound, partially visualized, corresponding to teratoma on CT. Left ovary Measurements: 5.2 x 3.7 x 3.8 cm. Small avascular simple appearing cysts. Pulsed Doppler evaluation demonstrates normal low-resistance arterial and venous waveforms in both ovaries. IMPRESSION: 1. Patient refused transvaginal exam. Exam limited by body habitus and bowel gas shadow. 2. No evidence of ovarian torsion. 3. Right ovarian mass corresponding to teratoma on prior CT. Electronically Signed   By: Kristine Garbe M.D.   On: 03/27/2017 05:56   Dg Chest Portable 1 View  Result Date: 03/27/2017 CLINICAL DATA:  Right lower abdominal pain, sepsis EXAM: PORTABLE CHEST 1 VIEW COMPARISON:  11/12/2014 FINDINGS: Heart and mediastinal contours are within normal limits. No focal opacities or effusions. No acute bony abnormality. IMPRESSION: No active disease. Electronically Signed   By: Rolm Baptise M.D.   On: 03/27/2017 00:55    Review of Systems  Constitutional: Negative for chills and fever.  HENT: Negative for sore throat and tinnitus.   Eyes: Negative for blurred vision and redness.  Respiratory: Negative for cough and shortness of breath.   Cardiovascular: Negative for chest pain, palpitations, orthopnea and PND.  Gastrointestinal: Positive for diarrhea and nausea. Negative for abdominal pain and vomiting.  Genitourinary: Negative for dysuria, frequency and urgency.  Musculoskeletal: Negative for joint pain and myalgias.  Skin: Negative for rash.       No lesions  Neurological: Negative for speech change, focal weakness and weakness.  Endo/Heme/Allergies: Does not bruise/bleed easily.       No temperature intolerance  Psychiatric/Behavioral: Negative for depression and suicidal ideas.    Blood pressure (!)  150/68, pulse (!) 130, temperature 97.9 F (36.6 C), temperature source Oral, resp. rate 16, height _0  (1.626 m), weight 102.7 kg (226 lb 8 oz), last menstrual period 02/14/2017, SpO2 100 %.  Physical Exam  Vitals reviewed. Constitutional: She is oriented to person, place, and time. She appears well-developed and well-nourished. No distress.  HENT:  Head: Normocephalic and atraumatic.  Mouth/Throat: Oropharynx is clear and moist.  Eyes: Conjunctivae and EOM are normal. Pupils are equal, round, and reactive to light. No scleral icterus.  Neck: Normal range of motion. Neck supple. No JVD present. No tracheal deviation present. No thyromegaly present.  Cardiovascular: Normal rate, regular rhythm and normal heart sounds. Exam reveals no gallop and no friction rub.  No murmur heard. Respiratory: Effort normal and breath sounds normal.  GI: Soft. Bowel sounds are normal. She exhibits no distension and no mass. There is tenderness. There is guarding. There is no rebound.  Genitourinary:  Genitourinary Comments: Deferred  Musculoskeletal: Normal range of motion. She exhibits no edema.  Lymphadenopathy:    She has no cervical adenopathy.  Neurological: She is alert and oriented to person, place, and time. No cranial nerve deficit. She exhibits normal muscle tone.  Skin: Skin is warm and dry. No rash noted. No erythema.  Psychiatric: She has a normal mood and affect. Her behavior is normal. Judgment and thought content normal.     Assessment/Plan This is a 52 year old female admitted for acute kidney injury. 1.  Acute kidney injury: Prerenal; secondary to dehydration due to diarrhea.  Hydrate with intravenous fluid.  Avoid nephrotoxic agents.  The patient was also taking a larger dose of lisinopril and originally prescribed.  I have held this medication due to his nephrotoxic effects. 2.  Sepsis: The patient meets criteria via tachycardia and leukocytosis.  She is hemodynamically stable.  CBC  differential is neutrophil predominant.  She is received cefepime and metronidazole in the emergency department and we will continue IV antibiotics as her pro-calcitonin is also elevated which is consistent with sepsis.  Obtain blood cultures and follow for growth and sensitivities. 3.  Hyperkalemia: The patient has received insulin and calcium in the emergency department I also have given her albuterol to help shift her potassium.  I have deferred Kayexalate at this time in order not to make her diarrhea worse. 4.  Ovarian mass: Appears to be a teratoma.  The patient is undergoing ultrasound.  Consult gynecological oncology. 5.  Hypertension: Controlled; continue metoprolol and hydrochlorothiazide 6.  Diabetes mellitus type 2: Hold oral hypoglycemic agents.  Sliding scale insulin while hospitalized. 7.  DVT prophylaxis: Heparin 8.  GI prophylaxis: Resolved per home regimen  Harrie Foreman, MD 03/27/2017, 6:58 AM

## 2017-03-27 NOTE — Progress Notes (Signed)
Inpatient Diabetes Program Recommendations  AACE/ADA: New Consensus Statement on Inpatient Glycemic Control (2015)  Target Ranges:  Prepandial:   less than 140 mg/dL      Peak postprandial:   less than 180 mg/dL (1-2 hours)      Critically ill patients:  140 - 180 mg/dL   Results for MEDRITH, VEILLON (MRN [id number]) as of 03/27/2017 09:41  Ref. Range 03/27/2017 03:39 03/27/2017 07:52  Glucose-Capillary Latest Ref Range: 65 - 99 mg/dL 207 (H) 232 (H)  Results for NASHANTI, DUQUETTE (MRN [id number]) as of 03/27/2017 09:41  Ref. Range 03/26/2017 23:27  Glucose Latest Ref Range: 65 - 99 mg/dL 318 (H)    Review of Glycemic Control  Diabetes history: DM2 Outpatient Diabetes medications: Glipizide XL 10 mg daily, Tradjenta 5 mg daily, Glumetza 500 mg BID Current orders for Inpatient glycemic control: Novolog 0-9 units TID with meals, Tradjenta 5 mg daily  Inpatient Diabetes Program Recommendations: Insulin - Basal: If glucose is consistently greater than 180 mg/dl with Novolog correction insulin, please consider ordering low dose basal insulin while inpatient. Recommend starting with Lantus 10 units Q24H. Correction (SSI): Please consider adding Novolog 0-5 units QHS for bedtime correction scale. HgbA1C: A1C in process. Oral DM medications: While inpatient, please consider discontinuing Tradjenta.  Thanks, Barnie Alderman, RN, MSN, CDE Diabetes Coordinator Inpatient Diabetes Program [phone] (Team Pager from 8am to 5pm)

## 2017-03-27 NOTE — ED Notes (Signed)
Patient transported to Ultrasound at this time. 

## 2017-03-27 NOTE — Progress Notes (Signed)
Family Meeting Note  Advance Directive:yes  Today a meeting took place with the Patient.    The following clinical team members were present during this meeting:MD  The following were discussed:Patient's diagnosis: Acute kidney injury, hyperkalemia, ovarian teratoma, insulin requiring diabetes mellitus  patient's progosis: Unable to determine and Goals for treatment: Full Code  Additional follow-up to be provided: Hospitalist, GYN , nephrology  Time spent during discussion:18 min  Nicholes Mango, MD

## 2017-03-27 NOTE — ED Notes (Signed)
FSBS at 0339 am: 207 mg/dL

## 2017-03-27 NOTE — ED Notes (Signed)
Dr Karma Greaser notified of pt's elevated at this time of pt's elevated Potassium level as reported by lab: 7.3 mmol/L. VORB for repeat CMP, EKG, and 1L NS bolus to be carried out be this Therapist, sports.

## 2017-03-27 NOTE — Progress Notes (Signed)
[facility] NAME: Selena Bennett    MR#:  [id number]  DATE OF BIRTH:  [birth year]  SUBJECTIVE:  CHIEF COMPLAINT: Patient is complaining of lower abdominal pain mainly on the right side.  Denies any nausea.  Diarrhea resolved  REVIEW OF SYSTEMS:  CONSTITUTIONAL: No fever, fatigue or weakness.  EYES: No blurred or double vision.  EARS, NOSE, AND THROAT: No tinnitus or ear pain.  RESPIRATORY: No cough, shortness of breath, wheezing or hemoptysis.  CARDIOVASCULAR: No chest pain, orthopnea, edema.  GASTROINTESTINAL: No nausea, vomiting, diarrhea, reporting  right-sided lower abdominal pain.  GENITOURINARY: No dysuria, hematuria.  ENDOCRINE: No polyuria, nocturia,  HEMATOLOGY: No anemia, easy bruising or bleeding SKIN: No rash or lesion. MUSCULOSKELETAL: No joint pain or arthritis.   NEUROLOGIC: No tingling, numbness, weakness.  PSYCHIATRY: No anxiety or depression.   DRUG ALLERGIES:  No Known Allergies  VITALS:  Blood pressure 122/71, pulse 89, temperature 98.8 F (37.1 C), resp. rate 18, height '5\' 4"'  (1.626 m), weight 102.7 kg (226 lb 8 oz), last menstrual period 02/14/2017, SpO2 100 %.  PHYSICAL EXAMINATION:  GENERAL:  52 y.o.-year-old patient lying in the bed with no acute distress.  EYES: Pupils equal, round, reactive to light and accommodation. No scleral icterus. Extraocular muscles intact.  HEENT: Head atraumatic, normocephalic. Oropharynx and nasopharynx clear.  NECK:  Supple, no jugular venous distention. No thyroid enlargement, no tenderness.  LUNGS: Normal breath sounds bilaterally, no wheezing, rales,rhonchi or crepitation. No use of accessory muscles of respiration.  CARDIOVASCULAR: S1, S2 normal. No murmurs, rubs, or gallops.  ABDOMEN: Soft, minimal tenderness in the right lower quadrant of the abdomen but no rebound tenderness, nondistended. Bowel sounds present. No organomegaly or mass.  EXTREMITIES: No pedal  edema, cyanosis, or clubbing.  NEUROLOGIC: Cranial nerves II through XII are intact. Muscle strength 5/5 in all extremities. Sensation intact. Gait not checked.  PSYCHIATRIC: The patient is alert and oriented x 3.  SKIN: No obvious rash, lesion, or ulcer.    LABORATORY PANEL:   CBC Recent Labs  Lab 03/26/17 2327  WBC 15.0*  HGB 9.2*  HCT 28.4*  PLT 140*   ------------------------------------------------------------------------------------------------------------------  Chemistries  Recent Labs  Lab 03/26/17 2327 03/27/17 0018 03/27/17 0855  NA 131*  --  135  K 7.3* 6.3* 6.6*  CL 106  --  114*  CO2 16*  --  13*  GLUCOSE 318*  --  241*  BUN 39*  --  37*  CREATININE 2.37*  --  1.98*  CALCIUM 7.8*  --  7.3*  MG  --  0.9*  --   AST 29  --   --   ALT 13*  --   --   ALKPHOS 83  --   --   BILITOT 1.1  --   --    ------------------------------------------------------------------------------------------------------------------  Cardiac Enzymes Recent Labs  Lab 03/27/17 0206  TROPONINI <0.03   ------------------------------------------------------------------------------------------------------------------  RADIOLOGY:  Ct Abdomen Pelvis Wo Contrast  Result Date: 03/27/2017 CLINICAL DATA:  52 year old female with abdominal pain. Concern for acute diverticulitis. EXAM: CT ABDOMEN AND PELVIS WITHOUT CONTRAST TECHNIQUE: Multidetector CT imaging of the abdomen and pelvis was performed following the standard protocol without IV contrast. COMPARISON:  Abdominal MRI dated 11/13/2014 and ultrasound dated 11/12/2014 FINDINGS: Evaluation of this exam is limited in the absence of intravenous contrast. Lower chest: The visualized lung bases are clear. No intra-abdominal free air or free fluid. Hepatobiliary: Probable mild fatty infiltration of  the liver. Mild enlargement of the left lobe of the liver may represent cirrhosis. No intrahepatic biliary ductal dilatation. Cholecystectomy.  Pancreas: Unremarkable. No pancreatic ductal dilatation or surrounding inflammatory changes. Spleen: The spleen is enlarged measuring up to 16 cm in greatest length. Adrenals/Urinary Tract: The adrenal glands are unremarkable. There is no hydronephrosis or nephrolithiasis on either side. The visualized ureters and urinary bladder appear unremarkable. Stomach/Bowel: There is colonic diverticulosis without active inflammatory changes. Multiple normal caliber fluid-filled loops of small bowel noted which may be physiologic or represent enteritis. No bowel obstruction. Loose stool noted throughout the colon compatible with diarrheal state. Correlation with clinical exam and stool cultures recommended the appendix is normal. Vascular/Lymphatic: Mild aortoiliac atherosclerotic disease. The abdominal aorta and IVC are otherwise grossly unremarkable on this noncontrast CT. No portal venous gas. There is no adenopathy. Recanalized appearance of the paraumbilical vein suggestive of portal hypertension. Reproductive: The uterus is anteverted. Bilateral ovarian cysts measure up to 3.7 cm on the left. There is a complex and multi septated right adnexal mass measuring 7.6 x 6.5 x 7.9 cm. This mass predominantly composed of fatty tissue with a small focus of calcification most consistent with a teratoma. Please note this can predispose the ovary to an increased risk for torsion. If there is clinical concern for ovarian torsion further evaluation with pelvic ultrasound with duplex interrogation of ovarian flow recommended. Malignant transformation of teratoma is not evaluated on CT. Further evaluation with MRI or surgical consult to determine treatment options recommended. Other: None Musculoskeletal: No acute or significant osseous findings. IMPRESSION: 1. Diarrheal state with findings of possible enteritis. Correlation with clinical exam and stool cultures recommended no bowel obstruction. Normal appendix. 2. Colonic  diverticulosis without active inflammatory changes. 3. 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 4. Right ovarian teratoma. Further evaluation with MRI or surgical consult is advised. If there is clinical concern for right ovarian torsion further evaluation with pelvic ultrasound recommended. Electronically Signed   By: Anner Crete M.D.   On: 03/27/2017 02:14   US Pelvis Complete  Result Date: 03/27/2017 CLINICAL DATA:  52 y/o  F; right pelvic pain for 2 weeks. EXAM: TRANSABDOMINAL ULTRASOUND OF PELVIS DOPPLER ULTRASOUND OF OVARIES TECHNIQUE: Transabdominal ultrasound examination of the pelvis was performed including evaluation of the uterus, ovaries, adnexal regions, and pelvic cul-de-sac. Color and duplex Doppler ultrasound was utilized to evaluate blood flow to the ovaries. COMPARISON:  03/27/2017 CT abdomen and pelvis. FINDINGS: Uterus Measurements: 10.5 x 5.5 x 5.2 cm. No fibroids or other mass visualized. Endometrium Thickness: 12 mm.  Poorly visualized. Right ovary Measurements: 9.1 x 5.1 x 9.4 cm. Enlarged complex ovarian mass with solid central component measuring up to 5.7 cm on ultrasound, partially visualized, corresponding to teratoma on CT. Left ovary Measurements: 5.2 x 3.7 x 3.8 cm. Small avascular simple appearing cysts. Pulsed Doppler evaluation demonstrates normal low-resistance arterial and venous waveforms in both ovaries. IMPRESSION: 1. Patient refused transvaginal exam. Exam limited by body habitus and bowel gas shadow. 2. No evidence of ovarian torsion. 3. Right ovarian mass corresponding to teratoma on prior CT. Electronically Signed   By: Kristine Garbe M.D.   On: 03/27/2017 05:56   [place] Art/ven Flow Abd Pelv Doppler  Result Date: 03/27/2017 CLINICAL DATA:  52 y/o  F; right pelvic pain for 2 weeks. EXAM: TRANSABDOMINAL ULTRASOUND OF PELVIS DOPPLER ULTRASOUND OF OVARIES TECHNIQUE: Transabdominal ultrasound examination of the pelvis was  performed including evaluation of the uterus, ovaries, adnexal regions, and  pelvic cul-de-sac. Color and duplex Doppler ultrasound was utilized to evaluate blood flow to the ovaries. COMPARISON:  03/27/2017 CT abdomen and pelvis. FINDINGS: Uterus Measurements: 10.5 x 5.5 x 5.2 cm. No fibroids or other mass visualized. Endometrium Thickness: 12 mm.  Poorly visualized. Right ovary Measurements: 9.1 x 5.1 x 9.4 cm. Enlarged complex ovarian mass with solid central component measuring up to 5.7 cm on ultrasound, partially visualized, corresponding to teratoma on CT. Left ovary Measurements: 5.2 x 3.7 x 3.8 cm. Small avascular simple appearing cysts. Pulsed Doppler evaluation demonstrates normal low-resistance arterial and venous waveforms in both ovaries. IMPRESSION: 1. Patient refused transvaginal exam. Exam limited by body habitus and bowel gas shadow. 2. No evidence of ovarian torsion. 3. Right ovarian mass corresponding to teratoma on prior CT. Electronically Signed   By: Kristine Garbe M.D.   On: 03/27/2017 05:56   Dg Chest Portable 1 View  Result Date: 03/27/2017 CLINICAL DATA:  Right lower abdominal pain, sepsis EXAM: PORTABLE CHEST 1 VIEW COMPARISON:  11/12/2014 FINDINGS: Heart and mediastinal contours are within normal limits. No focal opacities or effusions. No acute bony abnormality. IMPRESSION: No active disease. Electronically Signed   By: Rolm Baptise M.D.   On: 03/27/2017 00:55    EKG:   Orders placed or performed during the hospital encounter of 03/26/17  . ED EKG  . ED EKG  . EKG 12-Lead  . EKG 12-Lead    ASSESSMENT AND PLAN:   This is a 52 year old female admitted for acute kidney injury.  1.  Acute kidney injury: Prerenal; secondary to dehydration due to diarrhea.   Hydrate with intravenous fluid.   Avoid nephrotoxic agents.  The patient was also taking a larger dose of lisinopril and originally prescribed.  I have held this medication due to its nephrotoxic  effects. Creatinine 1.25-2.37-1.98 Nephrology consult placed  2.  Sepsis: The patient met  criteria via tachycardia and leukocytosis at the time of admission.   She is hemodynamically stable.    She has received cefepime and metronidazole in the emergency department and we will continue IV antibiotics Rocephin and Flagyl as her pro-calcitonin is also elevated which is consistent with sepsis.  Follow upon blood cultures and follow for growth and sensitivities.  3.  Hyperkalemia: The patient has received insulin and calcium in the emergency department   albuterol to help shift her potassium.   Kayexalate given as potassium is at 6.6 today, repeat potassium in the p.m.   4.  Ovarian mass: Appears to be a teratoma.   Ultrasound of the abdomen reveals 1.  possible enteritis. Normal appendix. 2. Colonic diverticulosis without active inflammatory changes. 3. 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 4. Right ovarian teratoma Consult gynecological oncology.  5.  Hypertension: Controlled; continue metoprolol and hydrochlorothiazide  6.  Diabetes mellitus type 2: Hold oral hypoglycemic agents.  Sliding scale insulin while hospitalized.  7.  DVT prophylaxis: Heparin     All the records are reviewed and case discussed with Care Management/Social Workerr. Management plans discussed with the patient, family and they are in agreement.  CODE STATUS: fc   TOTAL TIME TAKING CARE OF THIS PATIENT: 36 minutes.   POSSIBLE D/C IN 2  DAYS, DEPENDING ON CLINICAL CONDITION.  Note: This dictation was prepared with Dragon dictation along with smaller phrase technology. Any transcriptional errors that result from this process are unintentional.   Nicholes Mango M.D on 03/27/2017 at 2:13 PM  Between 7am to 6pm - Pager - [phone]  After 6pm go to [email or web address] - password [email or web address] [facility]  Office  [phone]  CC: Primary care physician; Patient, No  Pcp Per

## 2017-03-28 ENCOUNTER — Inpatient Hospital Stay: Payer: Self-pay

## 2017-03-28 LAB — MAGNESIUM
Magnesium: 1.3 mg/dL — ABNORMAL LOW (ref 1.7–2.4)
Magnesium: 1.8 mg/dL (ref 1.7–2.4)

## 2017-03-28 LAB — GLUCOSE, CAPILLARY
Glucose-Capillary: 194 mg/dL — ABNORMAL HIGH (ref 65–99)
Glucose-Capillary: 203 mg/dL — ABNORMAL HIGH (ref 65–99)
Glucose-Capillary: 258 mg/dL — ABNORMAL HIGH (ref 65–99)
Glucose-Capillary: 209 mg/dL — ABNORMAL HIGH (ref 65–99)

## 2017-03-28 LAB — BASIC METABOLIC PANEL
Anion gap: 8 (ref 5–15)
BUN: 30 mg/dL — ABNORMAL HIGH (ref 6–20)
CHLORIDE: 112 mmol/L — AB (ref 101–111)
CO2: 16 mmol/L — AB (ref 22–32)
CREATININE: 1.86 mg/dL — AB (ref 0.44–1.00)
Calcium: 7.1 mg/dL — ABNORMAL LOW (ref 8.9–10.3)
GFR calc non Af Amer: 30 mL/min — ABNORMAL LOW (ref >60)
GFR, EST AFRICAN AMERICAN: 35 mL/min — AB (ref >60)
GLUCOSE: 189 mg/dL — AB (ref 65–99)
Potassium: 4.6 mmol/L (ref 3.5–5.1)
Sodium: 136 mmol/L (ref 135–145)

## 2017-03-28 LAB — URINE CULTURE: Culture: NO GROWTH

## 2017-03-28 LAB — HEMOGLOBIN A1C
Hgb A1c MFr Bld: 8.5 % — ABNORMAL HIGH (ref 4.8–5.6)
Mean Plasma Glucose: 197 mg/dL

## 2017-03-28 MED ORDER — INSULIN ASPART 100 UNIT/ML ~~LOC~~ SOLN
0.0000 [IU] | Freq: Every day | SUBCUTANEOUS | Status: DC
  Administered 2017-03-28: 2 [IU] via SUBCUTANEOUS
  Administered 2017-03-30: 4 [IU] via SUBCUTANEOUS
  Administered 2017-03-31: 2 [IU] via SUBCUTANEOUS
  Filled 2017-03-28 (×5): qty 1

## 2017-03-28 MED ORDER — HEPARIN SODIUM (PORCINE) 5000 UNIT/ML IJ SOLN: 5000.0000 [IU] | Freq: Three times a day (TID) | INTRAMUSCULAR | Status: DC

## 2017-03-28 MED ORDER — INSULIN ASPART 100 UNIT/ML ~~LOC~~ SOLN
0.0000 [IU] | Freq: Three times a day (TID) | SUBCUTANEOUS | Status: DC
  Administered 2017-03-28: 12:00:00 3 [IU] via SUBCUTANEOUS
  Administered 2017-03-28: 17:00:00 5 [IU] via SUBCUTANEOUS
  Administered 2017-03-29: 2 [IU] via SUBCUTANEOUS
  Administered 2017-03-29: 13:00:00 3 [IU] via SUBCUTANEOUS
  Administered 2017-03-29 – 2017-03-30 (×2): 2 [IU] via SUBCUTANEOUS
  Administered 2017-03-30: 7 [IU] via SUBCUTANEOUS
  Administered 2017-03-31: 3 [IU] via SUBCUTANEOUS
  Administered 2017-03-31 (×2): 5 [IU] via SUBCUTANEOUS
  Administered 2017-03-31: 3 [IU] via SUBCUTANEOUS
  Administered 2017-04-01: 2 [IU] via SUBCUTANEOUS
  Filled 2017-03-28 (×11): qty 1

## 2017-03-28 MED ORDER — PHENOL 1.4 % MT LIQD
1.0000 | OROMUCOSAL | Status: DC | PRN
  Administered 2017-03-28 – 2017-03-30 (×2): 1 via OROMUCOSAL
  Filled 2017-03-28: qty 177

## 2017-03-28 MED ORDER — MAGNESIUM SULFATE 2 GM/50ML IV SOLN
2.0000 g | Freq: Once | INTRAVENOUS | Status: AC
  Administered 2017-03-28: 16:00:00 2 g via INTRAVENOUS
  Filled 2017-03-28: qty 50

## 2017-03-28 MED ORDER — INSULIN GLARGINE 100 UNIT/ML ~~LOC~~ SOLN
13.0000 [IU] | Freq: Every day | SUBCUTANEOUS | Status: DC
  Administered 2017-03-29 – 2017-04-01 (×4): 13 [IU] via SUBCUTANEOUS
  Filled 2017-03-28 (×7): qty 0.13

## 2017-03-28 MED ORDER — HEPARIN SODIUM (PORCINE) 5000 UNIT/ML IJ SOLN
5000.0000 [IU] | Freq: Three times a day (TID) | INTRAMUSCULAR | Status: DC
  Filled 2017-03-28: qty 1

## 2017-03-28 NOTE — Consult Note (Signed)
Selena Bennett, BREACH                ACCOUNT NO.:  [id number]  MEDICAL RECORD NO.:  [id number]  LOCATION:  [facility]                       FACILITY:  [facility]  PHYSICIAN:  Laverta Baltimore, MDDATE OF BIRTH:  25-Mar-1965  DATE OF CONSULTATION:03/27/17 DATE OF DISCHARGE:                                CONSULTATION   REASON FOR CONSULT:  Right ovarian mass and right pelvic pain.  HISTORY OF PRESENT ILLNESS:  A 52 year old gravida 4, para 4, status post 4 vaginal deliveries, comes in complaining of a greater than 1 month history of right lower abdominal/pelvic pain.  The patient was slated to be seen by me in the clinic this week, but her pain became unbearable and she presented through the emergency department yesterday evening late in the p.m.  The patient has had some nausea and diarrhea as well.  She has had poor gynecologic care through the years.  She does see Dr. Evette Doffing at [facility] in [place].  The patient was admitted through the emergency department with the following issues; 1. Acute kidney injury, thought to be prerenal and associated with     dehydration and diarrhea. 2. Sepsis, elevated white blood count and tachycardia as well as     increased procalcitonin. 3. Hyperkalemia. 4. Hypertension.  While being worked up in the emergency department, patient had both a CT scan and an abdominal ultrasound which showed a right complex ovarian cyst, mainly solid with fatty component and calcifications consistent with a teratoma.  Measurements of the ovary 9.1 x 9.4 x 5.1 cm.  Doppler flow showed normal flow bilaterally to each ovary.  CT scan showed; 1. Possible enteritis. 2. Colonic diverticulosis without acute inflammatory changes. 3. Probable cirrhosis with evidence of portal hypertension and     splenomegaly. 4. Right ovarian cyst.  PAST MEDICAL HISTORY: 1. Anemia. 2. Cirrhosis. 3. Diabetes. 4. Esophageal varices with hemorrhage. 5. Fatty liver. 6.  Gastroesophageal reflux disease. 7. Hypertension.  PAST SURGICAL HISTORY: 1. Cesarean section x1. 2. Cholecystectomy. 3. Esophageal banding.  REVIEW OF SYSTEMS:  CONSTITUTIONAL:  Negative for chills and fever. HEENT:  Negative for sore throat, tinnitus.  Eyes, negative for blurred vision or vision difficulties.  PULMONARY:  No shortness of breath.  No cough. CARDIOVASCULAR:  No chest pains.  No palpitations. GASTROINTESTINAL:  Positive bloating and diarrhea.  Positive abdominal pain, right lower quadrant.  GENITOURINARY:  Negative vaginal discharge. Positive vaginal bleeding.  MUSCULOSKELETAL:  Negative for joint pains, myalgias.  SKIN:  No rash.  NEUROLOGIC:  Negative for focal weakness or seizure activity.  PSYCHIATRIC:  Behavioral, negative for depression or suicidal ideation.  FAMILY HISTORY:  No gynecologic cancer.  SOCIAL HISTORY:  Negative tobacco.  No drugs.  No alcohol use.  ALLERGIES:  NO KNOWN DRUG ALLERGIES.  MEDICATIONS:  Currently in the hospital; 1. Proventil. 2. Rocephin. 3. Flexeril. 4. Colace. 5. Heparin 5000 units q.8 hours. 6. NovoLog insulin. 7. Lantus insulin. 8. Lopressor 50 mg b.i.d. 9. Metronidazole 500 mg q.8 hours. 10.Morphine 2-4 mg intravenous every 4 hours. 11.Zofran 4 mg q.6 hours p.r.n. 12.Protonix 20 mg oral daily.  PHYSICAL EXAMINATION:  GENERAL:  Well-developed, well-nourished black female, in no acute distress. VITAL SIGNS:  Temperature 98.6, blood pressure 132/66, pulse ox 100, respiratory rate 14. HEENT:  Intact. LUNGS:  Clear to auscultation. CARDIOVASCULAR:  Regular rate and rhythm without murmur. GASTROINTESTINAL:  Nondistended.  No palpable mass.  Slight tenderness to palpation, right lower quadrant.  No guarding.  No rebound. GENITOURINARY:  Pelvic exam deferred. MUSCULOSKELETAL:  Normal range of motion.  LABORATORY STUDIES:  CBC at 2327 hours on March 26, 2017, white blood count 15,000, hematocrit 28.4%, platelets  140,000, negative beta HCG. Metabolic panel at 4627 on March 27, 2017, potassium 6.6, glucose 241, creatinine 1.98 and GFR 33.  TSH 2.6.  CT and ultrasound results are as above.  ASSESSMENT: 1. Acute kidney injury with hyperkalemia. 2. Sepsis.  Patient currently is afebrile, on IV antibiotics. 3. Hypertension, well controlled. 4. Diabetes mellitus type 2.  Currently on insulin with sugars     improving. 5. Right ovarian mass, consistent with mature teratoma.  Pain on the     right lower quadrant most consistent with this mass.  Recent     ultrasound shows normal blood flow to the ovary, although torsion     is a possible etiology.  Does not seem to be currently an issue.  PLAN:  I have spoken to the patient regarding surgical intervention with removal of the right ovary plus or minus her left ovary and bilateral fallopian tubes.  The patient needs to be medically cleared before performing surgery.  Nephrology consult is pending.  Patient needs to be off heparin 24 hours before consideration for surgery as well.  We will continue to follow the patient daily, and once her medical diagnoses have improved, we will schedule surgery either in the hospital or as an outpatient.  If her pain acutely worsens, consideration for a more emergent procedure.          ______________________________ Laverta Baltimore, MD     TS/MEDQ  D:  03/27/2017  T:  03/28/2017  Job:  [id number]

## 2017-03-28 NOTE — Progress Notes (Signed)
Subjective: Patient reports Pt is improving    Objective: I have reviewed patient's   General: alert and cooperative Resp: clear to auscultation bilaterally Cardio: regular rate and rhythm, S1, S2 normal, no murmur, click, rub or gallop GI: soft, non-tender; bowel sounds normal; no masses,  no organomegaly  Results for orders placed or performed during the hospital encounter of 03/26/17 (from the past 24 hour(s))  Glucose, capillary     Status: Abnormal   Collection Time: 03/27/17  9:11 PM  Result Value Ref Range   Glucose-Capillary 190 (H) 65 - 99 mg/dL  Basic metabolic panel     Status: Abnormal   Collection Time: 03/28/17  4:43 AM  Result Value Ref Range   Sodium 136 135 - 145 mmol/L   Potassium 4.6 3.5 - 5.1 mmol/L   Chloride 112 (H) 101 - 111 mmol/L   CO2 16 (L) 22 - 32 mmol/L   Glucose, Bld 189 (H) 65 - 99 mg/dL   BUN 30 (H) 6 - 20 mg/dL   Creatinine, Ser 1.86 (H) 0.44 - 1.00 mg/dL   Calcium 7.1 (L) 8.9 - 10.3 mg/dL   GFR calc non Af Amer 30 (L) >60 mL/min   GFR calc Af Amer 35 (L) >60 mL/min   Anion gap 8 5 - 15  Magnesium     Status: Abnormal   Collection Time: 03/28/17  4:43 AM  Result Value Ref Range   Magnesium 1.3 (L) 1.7 - 2.4 mg/dL  Glucose, capillary     Status: Abnormal   Collection Time: 03/28/17  7:45 AM  Result Value Ref Range   Glucose-Capillary 194 (H) 65 - 99 mg/dL  Glucose, capillary     Status: Abnormal   Collection Time: 03/28/17 11:54 AM  Result Value Ref Range   Glucose-Capillary 203 (H) 65 - 99 mg/dL  Glucose, capillary     Status: Abnormal   Collection Time: 03/28/17  5:00 PM  Result Value Ref Range   Glucose-Capillary 258 (H) 65 - 99 mg/dL   Assessment/Plan: Right ovarian mass , pain improving  I spoke to dr Margaretmary Eddy about possible surgery Friday am  Heparin will be stopped in am   LOS: 1 day    Selena Bennett 03/28/2017, 6:00 PM

## 2017-03-28 NOTE — Progress Notes (Signed)
[facility] NAME: Selena Bennett    MR#:  [id number]  DATE OF BIRTH:  1965/02/01  SUBJECTIVE:  CHIEF COMPLAINT: Patient is complaining of lower abdominal pain mainly on the right side.  Pain medications taking the edge off.  Denies any nausea.  Diarrhea resolved  REVIEW OF SYSTEMS:  CONSTITUTIONAL: No fever, fatigue or weakness.  EYES: No blurred or double vision.  EARS, NOSE, AND THROAT: No tinnitus or ear pain.  RESPIRATORY: No cough, shortness of breath, wheezing or hemoptysis.  CARDIOVASCULAR: No chest pain, orthopnea, edema.  GASTROINTESTINAL: No nausea, vomiting, diarrhea, reporting  right-sided lower abdominal pain.  GENITOURINARY: No dysuria, hematuria.  ENDOCRINE: No polyuria, nocturia,  HEMATOLOGY: No anemia, easy bruising or bleeding SKIN: No rash or lesion. MUSCULOSKELETAL: No joint pain or arthritis.   NEUROLOGIC: No tingling, numbness, weakness.  PSYCHIATRY: No anxiety or depression.   DRUG ALLERGIES:  No Known Allergies  VITALS:  Blood pressure 138/66, pulse 94, temperature 98.9 F (37.2 C), temperature source Oral, resp. rate 18, height '5\' 4"'  (1.626 m), weight 102.7 kg (226 lb 8 oz), last menstrual period 02/14/2017, SpO2 97 %.  PHYSICAL EXAMINATION:  GENERAL:  52 y.o.-year-old patient lying in the bed with no acute distress.  EYES: Pupils equal, round, reactive to light and accommodation. No scleral icterus. Extraocular muscles intact.  HEENT: Head atraumatic, normocephalic. Oropharynx and nasopharynx clear.  NECK:  Supple, no jugular venous distention. No thyroid enlargement, no tenderness.  LUNGS: Normal breath sounds bilaterally, no wheezing, rales,rhonchi or crepitation. No use of accessory muscles of respiration.  CARDIOVASCULAR: S1, S2 normal. No murmurs, rubs, or gallops.  ABDOMEN: Soft, minimal tenderness in the right lower quadrant of the abdomen but no rebound tenderness, nondistended. Bowel  sounds present. No organomegaly or mass.  EXTREMITIES: No pedal edema, cyanosis, or clubbing.  NEUROLOGIC: Cranial nerves II through XII are intact. Muscle strength 5/5 in all extremities. Sensation intact. Gait not checked.  PSYCHIATRIC: The patient is alert and oriented x 3.  SKIN: No obvious rash, lesion, or ulcer.    LABORATORY PANEL:   CBC Recent Labs  Lab 03/26/17 2327  WBC 15.0*  HGB 9.2*  HCT 28.4*  PLT 140*   ------------------------------------------------------------------------------------------------------------------  Chemistries  Recent Labs  Lab 03/26/17 2327  03/28/17 0443  NA 131*   < > 136  K 7.3*   < > 4.6  CL 106   < > 112*  CO2 16*   < > 16*  GLUCOSE 318*   < > 189*  BUN 39*   < > 30*  CREATININE 2.37*   < > 1.86*  CALCIUM 7.8*   < > 7.1*  MG  --    < > 1.3*  AST 29  --   --   ALT 13*  --   --   ALKPHOS 83  --   --   BILITOT 1.1  --   --    < > = values in this interval not displayed.   ------------------------------------------------------------------------------------------------------------------  Cardiac Enzymes Recent Labs  Lab 03/27/17 0206  TROPONINI <0.03   ------------------------------------------------------------------------------------------------------------------  RADIOLOGY:  Ct Abdomen Pelvis Wo Contrast  Result Date: 03/27/2017 CLINICAL DATA:  52 year old female with abdominal pain. Concern for acute diverticulitis. EXAM: CT ABDOMEN AND PELVIS WITHOUT CONTRAST TECHNIQUE: Multidetector CT imaging of the abdomen and pelvis was performed following the standard protocol without IV contrast. COMPARISON:  Abdominal MRI dated 11/13/2014 and ultrasound dated 11/12/2014 FINDINGS: Evaluation of  this exam is limited in the absence of intravenous contrast. Lower chest: The visualized lung bases are clear. No intra-abdominal free air or free fluid. Hepatobiliary: Probable mild fatty infiltration of the liver. Mild enlargement of the left  lobe of the liver may represent cirrhosis. No intrahepatic biliary ductal dilatation. Cholecystectomy. Pancreas: Unremarkable. No pancreatic ductal dilatation or surrounding inflammatory changes. Spleen: The spleen is enlarged measuring up to 16 cm in greatest length. Adrenals/Urinary Tract: The adrenal glands are unremarkable. There is no hydronephrosis or nephrolithiasis on either side. The visualized ureters and urinary bladder appear unremarkable. Stomach/Bowel: There is colonic diverticulosis without active inflammatory changes. Multiple normal caliber fluid-filled loops of small bowel noted which may be physiologic or represent enteritis. No bowel obstruction. Loose stool noted throughout the colon compatible with diarrheal state. Correlation with clinical exam and stool cultures recommended the appendix is normal. Vascular/Lymphatic: Mild aortoiliac atherosclerotic disease. The abdominal aorta and IVC are otherwise grossly unremarkable on this noncontrast CT. No portal venous gas. There is no adenopathy. Recanalized appearance of the paraumbilical vein suggestive of portal hypertension. Reproductive: The uterus is anteverted. Bilateral ovarian cysts measure up to 3.7 cm on the left. There is a complex and multi septated right adnexal mass measuring 7.6 x 6.5 x 7.9 cm. This mass predominantly composed of fatty tissue with a small focus of calcification most consistent with a teratoma. Please note this can predispose the ovary to an increased risk for torsion. If there is clinical concern for ovarian torsion further evaluation with pelvic ultrasound with duplex interrogation of ovarian flow recommended. Malignant transformation of teratoma is not evaluated on CT. Further evaluation with MRI or surgical consult to determine treatment options recommended. Other: None Musculoskeletal: No acute or significant osseous findings. IMPRESSION: 1. Diarrheal state with findings of possible enteritis. Correlation with  clinical exam and stool cultures recommended no bowel obstruction. Normal appendix. 2. Colonic diverticulosis without active inflammatory changes. 3. 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 4. Right ovarian teratoma. Further evaluation with MRI or surgical consult is advised. If there is clinical concern for right ovarian torsion further evaluation with pelvic ultrasound recommended. Electronically Signed   By: Anner Crete M.D.   On: 03/27/2017 02:14   US Pelvis Complete  Result Date: 03/27/2017 CLINICAL DATA:  52 y/o  F; right pelvic pain for 2 weeks. EXAM: TRANSABDOMINAL ULTRASOUND OF PELVIS DOPPLER ULTRASOUND OF OVARIES TECHNIQUE: Transabdominal ultrasound examination of the pelvis was performed including evaluation of the uterus, ovaries, adnexal regions, and pelvic cul-de-sac. Color and duplex Doppler ultrasound was utilized to evaluate blood flow to the ovaries. COMPARISON:  03/27/2017 CT abdomen and pelvis. FINDINGS: Uterus Measurements: 10.5 x 5.5 x 5.2 cm. No fibroids or other mass visualized. Endometrium Thickness: 12 mm.  Poorly visualized. Right ovary Measurements: 9.1 x 5.1 x 9.4 cm. Enlarged complex ovarian mass with solid central component measuring up to 5.7 cm on ultrasound, partially visualized, corresponding to teratoma on CT. Left ovary Measurements: 5.2 x 3.7 x 3.8 cm. Small avascular simple appearing cysts. Pulsed Doppler evaluation demonstrates normal low-resistance arterial and venous waveforms in both ovaries. IMPRESSION: 1. Patient refused transvaginal exam. Exam limited by body habitus and bowel gas shadow. 2. No evidence of ovarian torsion. 3. Right ovarian mass corresponding to teratoma on prior CT. Electronically Signed   By: Kristine Garbe M.D.   On: 03/27/2017 05:56   US Renal  Result Date: 03/28/2017 CLINICAL DATA:  Acute renal failure. EXAM: RENAL / URINARY TRACT ULTRASOUND COMPLETE COMPARISON:  Abdominal and pelvic CT scan of  March 27 2017 FINDINGS: Right Kidney: Length: 10 cm. The renal cortical echotexture is approximately equal to that of the adjacent liver. There is no hydronephrosis. Left Kidney: Length: 11.4 cm. The renal cortical echotexture is similar to that on the right. There is no hydronephrosis. Bladder: The partially distended urinary bladder is normal. IMPRESSION: Mildly increased renal cortical echotexture bilaterally may reflect medical renal disease. There is no hydronephrosis. Electronically Signed   By: David  Martinique M.D.   On: 03/28/2017 11:24   [place] Art/ven Flow Abd Pelv Doppler  Result Date: 03/27/2017 CLINICAL DATA:  52 y/o  F; right pelvic pain for 2 weeks. EXAM: TRANSABDOMINAL ULTRASOUND OF PELVIS DOPPLER ULTRASOUND OF OVARIES TECHNIQUE: Transabdominal ultrasound examination of the pelvis was performed including evaluation of the uterus, ovaries, adnexal regions, and pelvic cul-de-sac. Color and duplex Doppler ultrasound was utilized to evaluate blood flow to the ovaries. COMPARISON:  03/27/2017 CT abdomen and pelvis. FINDINGS: Uterus Measurements: 10.5 x 5.5 x 5.2 cm. No fibroids or other mass visualized. Endometrium Thickness: 12 mm.  Poorly visualized. Right ovary Measurements: 9.1 x 5.1 x 9.4 cm. Enlarged complex ovarian mass with solid central component measuring up to 5.7 cm on ultrasound, partially visualized, corresponding to teratoma on CT. Left ovary Measurements: 5.2 x 3.7 x 3.8 cm. Small avascular simple appearing cysts. Pulsed Doppler evaluation demonstrates normal low-resistance arterial and venous waveforms in both ovaries. IMPRESSION: 1. Patient refused transvaginal exam. Exam limited by body habitus and bowel gas shadow. 2. No evidence of ovarian torsion. 3. Right ovarian mass corresponding to teratoma on prior CT. Electronically Signed   By: Kristine Garbe M.D.   On: 03/27/2017 05:56   Dg Chest Portable 1 View  Result Date: 03/27/2017 CLINICAL DATA:  Right lower abdominal  pain, sepsis EXAM: PORTABLE CHEST 1 VIEW COMPARISON:  11/12/2014 FINDINGS: Heart and mediastinal contours are within normal limits. No focal opacities or effusions. No acute bony abnormality. IMPRESSION: No active disease. Electronically Signed   By: Rolm Baptise M.D.   On: 03/27/2017 00:55    EKG:   Orders placed or performed during the hospital encounter of 03/26/17  . ED EKG  . ED EKG  . EKG 12-Lead  . EKG 12-Lead    ASSESSMENT AND PLAN:   This is a 52 year old female admitted for acute kidney injury.  1.  Acute kidney injury: Prerenal; secondary to dehydration due to diarrhea.   Hydrate with intravenous fluid.   Avoid nephrotoxic agents.  The patient was also taking a larger dose of lisinopril and originally prescribed.  I have held this medication due to its nephrotoxic effects. Creatinine 1.25-2.37-1.98-1.86 Nephrology is following with the patient and okay with the surgery from nephrology standpoint  2.  Sepsis: The patient met  criteria via tachycardia and leukocytosis at the time of admission.   She is hemodynamically stable.    She has received cefepime and metronidazole in the emergency department and we will continue IV antibiotics Rocephin and Flagyl as her pro-calcitonin is also elevated which is consistent with sepsis.   Blood and urine cultures are negative so far  Check CBC in a.m.   3.  Hypomagnesemia  Replete and recheck in a.m. the patient has received insulin and calcium in the emergency department   albuterol to help shift her potassium.   Kayexalate given, potassium 4.6 today  4.  Ovarian mass: Appears to be a teratoma.   Ultrasound of the abdomen reveals 1.  possible enteritis.  Normal appendix. 2. Colonic diverticulosis without active inflammatory changes. 3. 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  Planning to do surgery on Friday a.m.  5.  Hypertension:  Controlled; continue metoprolol and hydrochlorothiazide  6.  Diabetes mellitus type 2: Hold oral hypoglycemic agents.  Sliding scale insulin while hospitalized.  7.  DVT prophylaxis: Heparin, will hold heparin after tomorrow a.m. dose  as patient is scheduled for surgery on Friday morning by GYN     All the records are reviewed and case discussed with Care Management/Social Workerr. Management plans discussed with the patient, family and they are in agreement.  CODE STATUS: fc   TOTAL TIME TAKING CARE OF THIS PATIENT: 36 minutes.   POSSIBLE D/C IN 2  DAYS, DEPENDING ON CLINICAL CONDITION.  Note: This dictation was prepared with Dragon dictation along with smaller phrase technology. Any transcriptional errors that result from this process are unintentional.   Nicholes Mango M.D on 03/28/2017 at 6:09 PM  Between 7am to 6pm - Pager - [phone] After 6pm go to [email or web address] - password [email or web address] [facility]  Office  [phone]  CC: Primary care physician; Patient, No Pcp Per

## 2017-03-28 NOTE — Progress Notes (Signed)
MEDICATION RELATED CONSULT NOTE - INITIAL   Pharmacy Consult for electrolyte management Indication: hypomagnesemia  No Known Allergies  Patient Measurements: Height: 5\' 4"  (162.6 cm) Weight: 226 lb 8 oz (102.7 kg) IBW/kg (Calculated) : 54.7 Adjusted Body Weight:   Vital Signs: Temp: 98.9 F (37.2 C) (02/27 1312) Temp Source: Oral (02/27 1312) BP: 138/66 (02/27 1312) Pulse Rate: 94 (02/27 1312) Intake/Output from previous day: 02/26 0701 - 02/27 0700 In: 3620 [P.O.:240; I.V.:3080; IV Piggyback:300] Out: -  Intake/Output from this shift: Total I/O In: 1697.5 [P.O.:720; I.V.:627.5; IV Piggyback:350] Out: -   Labs: Recent Labs    03/26/17 2327 03/27/17 0018 03/27/17 0855 03/28/17 0443  WBC 15.0*  --   --   --   HGB 9.2*  --   --   --   HCT 28.4*  --   --   --   PLT 140*  --   --   --   CREATININE 2.37*  --  1.98* 1.86*  MG  --  0.9*  --  1.3*  ALBUMIN 3.6  --   --   --   PROT 8.2*  --   --   --   AST 29  --   --   --   ALT 13*  --   --   --   ALKPHOS 83  --   --   --   BILITOT 1.1  --   --   --    Estimated Creatinine Clearance: 41.7 mL/min (A) (by C-G formula based on SCr of 1.86 mg/dL (H)).   Microbiology: Recent Results (from the past 720 hour(s))  Blood Culture (routine x 2)     Status: None (Preliminary result)   Collection Time: 03/27/17  1:06 AM  Result Value Ref Range Status   Specimen Description BLOOD RIGHT FA  Final   Special Requests   Final    BOTTLES DRAWN AEROBIC AND ANAEROBIC Blood Culture results may not be optimal due to an excessive volume of blood received in culture bottles   Culture   Final    NO GROWTH 1 DAY Performed at [facility], [address], [place], [place] [address]    Report Status PENDING  Incomplete  Blood Culture (routine x 2)     Status: None (Preliminary result)   Collection Time: 03/27/17  2:06 AM  Result Value Ref Range Status   Specimen Description BLOOD LEFT FA  Final   Special Requests   Final   BOTTLES DRAWN AEROBIC AND ANAEROBIC Blood Culture adequate volume   Culture   Final    NO GROWTH 1 DAY Performed at [facility], [address], [place], [place] [address]    Report Status PENDING  Incomplete  Urine culture     Status: None   Collection Time: 03/27/17  2:22 AM  Result Value Ref Range Status   Specimen Description   Final    URINE, RANDOM Performed at [facility], [address], [place], [place] [address]    Special Requests   Final    NONE Performed at [facility], [address] [place], [place] [address]    Culture   Final    NO GROWTH Performed at [facility], [place] [address], [place], [place] [address]    Report Status 03/28/2017 FINAL  Final    Medical History: Past Medical History:  Diagnosis Date  . Anemia    [facility] 02-04-13 acute blood loss  .  Cirrhosis ([place])   . Diabetes ([place])   . 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 Fatty liver   . GERD (gastroesophageal reflux disease)   . Hypertension     Medications:  Infusions:  . sodium chloride 50 mL/hr at 03/28/17 1706  . cefTRIAXone (ROCEPHIN)  IV Stopped (03/28/17 1754)  . metronidazole Stopped (03/28/17 1536)    Assessment: 52 yof cc lower abdominal pain presented with diarrhea now resolved. Electrolyte abnormalities noted. Pharmacy consulted to manage electrolytes.  Goal of Therapy:  Electrolytes WNL.  Plan:  Patient received calcium 1 gm IV x 1 this AM and magnesium 2 gm IV x 1 this AM and magnesium 2 gm IV x 1 this PM. Will recheck magnesium this evening and all electrolytes tomorrow with AM labs.  Laural Benes, Pharm.D., BCPS Clinical Pharmacist 03/28/2017,6:32 PM

## 2017-03-28 NOTE — Plan of Care (Signed)
  Progressing Education: Knowledge of General Education information will improve 03/28/2017 1839 - Progressing by Daylene Posey, RN Clinical Measurements: Ability to maintain clinical measurements within normal limits will improve 03/28/2017 1839 - Progressing by Daylene Posey, RN Will remain free from infection 03/28/2017 1839 - Progressing by Daylene Posey, RN Diagnostic test results will improve 03/28/2017 1839 - Progressing by Daylene Posey, RN Cardiovascular complication will be avoided 03/28/2017 1839 - Progressing by Daylene Posey, RN Pain Managment: General experience of comfort will improve 03/28/2017 1839 - Progressing by Daylene Posey, RN

## 2017-03-28 NOTE — Consult Note (Signed)
[facility]  CONSULT NOTE    Date: 03/28/2017                  Patient Name:  Selena Bennett  MRN: [id number]  DOB: 05/16/1965  Age / Sex: 52 y.o., female         PCP: Patient, No Pcp Per                 Service Requesting Consult: Dr. Margaretmary Eddy                 Reason for Consult: Acute Renal Failure            History of Present Illness: Ms. Selena Bennett is a 52 y.o. 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   Admitted with acute renal failure with hyperkalemia. Treated with IV fluids   Medications: Outpatient medications: Medications Prior to Admission  Medication Sig Dispense Refill Last Dose  . cyclobenzaprine (FLEXERIL) 10 MG tablet Take 10 mg by mouth 3 (three) times daily as needed for muscle spasms.   03/27/2017 at Unknown time  . glipiZIDE (GLUCOTROL XL) 10 MG 24 hr tablet Take 10 mg by mouth daily.   03/27/2017 at Unknown time  . hydrochlorothiazide (HYDRODIURIL) 25 MG tablet Take 25 mg by mouth daily.   03/27/2017 at Unknown time  . linagliptin (TRADJENTA) 5 MG TABS tablet Take 5 mg by mouth daily.   03/27/2017 at Unknown time  . lisinopril (PRINIVIL,ZESTRIL) 20 MG tablet Take 1 tablet (20 mg total) by mouth daily. (Patient taking differently: Take 40 mg by mouth daily. ) 30 tablet 0 03/27/2017 at Unknown time  . magnesium oxide (MAG-OX) 400 MG tablet Take 400 mg by mouth 2 (two) times daily.   03/27/2017 at Unknown time  . metFORMIN (GLUMETZA) 500 MG (MOD) 24 hr tablet Take 500 mg by mouth 2 (two) times daily with a meal.   03/27/2017 at Unknown time  . metoprolol  (LOPRESSOR) 50 MG tablet Take 50 mg by mouth 2 (two) times daily.   03/27/2017 at Unknown time  . pantoprazole (PROTONIX) 40 MG tablet Take 1 tablet (40 mg total) by mouth daily. (Patient taking differently: Take 20 mg by mouth daily. ) 30 tablet 0 03/27/2017 at Unknown time  . amLODipine (NORVASC) 10 MG tablet Take 1 tablet (10 mg total) by mouth daily. (Patient not taking: Reported on 03/27/2017) 30 tablet 0 Not Taking at Unknown time  . ferrous sulfate (FERROUSUL) 325 (65 FE) MG tablet Take 1 tablet (325 mg total) by mouth 2 (two) times daily with a meal. (Patient not taking: Reported on 03/27/2017) 60 tablet 3 Not Taking at Unknown time    Current medications: Current Facility-Administered Medications  Medication Dose Route Frequency Provider Last Rate Last Dose  . 0.9 %  sodium chloride infusion   Intravenous Continuous Andrius Andrepont, MD 50 mL/hr at 03/28/17 1706    . acetaminophen (TYLENOL) tablet 650 mg  650 mg Oral Q6H PRN Harrie Foreman, MD       Or  . acetaminophen (TYLENOL) suppository 650 mg  650 mg Rectal Q6H PRN Harrie Foreman, MD      .  cefTRIAXone (ROCEPHIN) 2 g in sodium chloride 0.9 % 100 mL IVPB  2 g Intravenous Q24H Nicholes Mango, MD   Stopped at 03/28/17 1754  . cyclobenzaprine (FLEXERIL) tablet 10 mg  10 mg Oral TID PRN Harrie Foreman, MD      . docusate sodium (COLACE) capsule 100 mg  100 mg Oral BID Harrie Foreman, MD   100 mg at 03/28/17 0842  . heparin injection 5,000 Units  5,000 Units Subcutaneous Q8H Lifsey, Betti Cruz, RPH      . insulin aspart (novoLOG) injection 0-5 Units  0-5 Units Subcutaneous QHS Gouru, Aruna, MD      . insulin aspart (novoLOG) injection 0-9 Units  0-9 Units Subcutaneous TID WC Gouru, Aruna, MD   5 Units at 03/28/17 1706  . [START ON 03/29/2017] insulin glargine (LANTUS) injection 13 Units  13 Units Subcutaneous Daily Gouru, Aruna, MD      . magnesium oxide (MAG-OX) tablet 400 mg  400 mg Oral BID Gouru, Aruna, MD   400 mg at 03/28/17  0842  . metoprolol tartrate (LOPRESSOR) tablet 50 mg  50 mg Oral BID Harrie Foreman, MD   50 mg at 03/28/17 4081  . metroNIDAZOLE (FLAGYL) IVPB 500 mg  500 mg Intravenous Jeanella Anton, MD   Stopped at 03/28/17 1536  . morphine 2 MG/ML injection 2-4 mg  2-4 mg Intravenous Q4H PRN Harrie Foreman, MD   4 mg at 03/28/17 4481  . ondansetron (ZOFRAN) tablet 4 mg  4 mg Oral Q6H PRN Harrie Foreman, MD       Or  . ondansetron [facility]) injection 4 mg  4 mg Intravenous Q6H PRN Harrie Foreman, MD   4 mg at 03/28/17 1441  . pantoprazole sodium (PROTONIX) 40 mg/20 mL oral suspension 20 mg  20 mg Oral Daily Harrie Foreman, MD   20 mg at 03/28/17 8563      Allergies: No Known Allergies    Past Medical History: Past Medical History:  Diagnosis Date  . Anemia    [facility] 02-04-13 acute blood loss  . Cirrhosis ([place])   . Diabetes ([place])   . Esophageal varices with hemorrhage ([place]) 01/30/2013  . Fatty liver   . GERD (gastroesophageal reflux disease)   . Hypertension      Past Surgical History: Past Surgical History:  Procedure Laterality Date  . C section x1     1989  . CHOLECYSTECTOMY    . CHOLECYSTECTOMY OPEN    . ESOPHAGEAL BANDING N/A 02/21/2013   Procedure: ESOPHAGEAL BANDING;  Surgeon: Beryle Beams, MD;  Location: [facility] ENDOSCOPY;  Service: Endoscopy;  Laterality: N/A;  . ESOPHAGEAL BANDING N/A 03/21/2013   Procedure: ESOPHAGEAL BANDING;  Surgeon: Beryle Beams, MD;  Location: [facility] ENDOSCOPY;  Service: Endoscopy;  Laterality: N/A;  . ESOPHAGOGASTRODUODENOSCOPY N/A 01/28/2013   Procedure: ESOPHAGOGASTRODUODENOSCOPY (EGD);  Surgeon: Beryle Beams, MD;  Location: [facility] ENDOSCOPY;  Service: Endoscopy;  Laterality: N/A;  bedside  . ESOPHAGOGASTRODUODENOSCOPY N/A 02/21/2013   Procedure: ESOPHAGOGASTRODUODENOSCOPY (EGD);  Surgeon: Beryle Beams, MD;  Location: [facility] END[facility]COPY;  Service: Endoscopy;  Laterality: N/A;  . ESOPHAGOGASTRODUODENOSCOPY N/A 03/21/2013   Procedure:  ESOPHAGOGASTRODUODENOSCOPY (EGD);  Surgeon: Beryle Beams, MD;  Location: [facility] ENDOSCOPY;  Service: Endoscopy;  Laterality: N/A;  . ESOPHAGOGASTRODUODENOSCOPY N/A 04/22/2013   Procedure: ESOPHAGOGASTRODUODENOSCOPY (EGD);  Surgeon: Beryle Beams, MD;  Location: [facility] ENDOSCOPY;  Service: Endoscopy;  Laterality: N/A;  . ESOPHAGOGASTRODUODENOSCOPY (EGD) WITH PROPOFOL N/A 11/16/2014  Procedure: ESOPHAGOGASTRODUODENOSCOPY (EGD) WITH PROPOFOL;  Surgeon: Josefine Class, MD;  Location: [facility] ENDOSCOPY;  Service: Endoscopy;  Laterality: N/A;     Family History: History reviewed. No pertinent family history.   Social History: Social History   Socioeconomic History  . Marital status: Married    Spouse name: Not on file  . Number of children: Not on file  . Years of education: Not on file  . Highest education level: Not on file  Social Needs  . Financial resource strain: Not very hard  . Food insecurity - worry: Patient refused  . Food insecurity - inability: Patient refused  . Transportation needs - medical: Patient refused  . Transportation needs - non-medical: Patient refused  Occupational History  . Not on file  Tobacco Use  . Smoking status: Never Smoker  . Smokeless tobacco: Never Used  Substance and Sexual Activity  . Alcohol use: No  . Drug use: No  . Sexual activity: Yes  Other Topics Concern  . Not on file  Social History Narrative  . Not on file     Review of Systems: Review of Systems  Constitutional: Positive for chills, diaphoresis, fever, malaise/fatigue and weight loss.  HENT: Negative.  Negative for congestion, ear discharge, ear pain, hearing loss, nosebleeds, sinus pain, sore throat and tinnitus.   Eyes: Negative.  Negative for blurred vision, double vision, photophobia, pain, discharge and redness.  Respiratory: Negative.  Negative for cough, hemoptysis, sputum production, shortness of breath, wheezing and stridor.   Cardiovascular: Negative.  Negative for  chest pain, palpitations, orthopnea, claudication, leg swelling and PND.  Gastrointestinal: Positive for abdominal pain, constipation, diarrhea, nausea and vomiting. Negative for blood in stool, heartburn and melena.  Genitourinary: Negative.  Negative for dysuria, flank pain, frequency, hematuria and urgency.  Musculoskeletal: Negative.  Negative for back pain, falls, joint pain, myalgias and neck pain.  Skin: Negative.  Negative for itching and rash.  Neurological: Negative.  Negative for dizziness, tingling, tremors, sensory change, speech change, focal weakness, seizures, loss of consciousness, weakness and headaches.  Endo/Heme/Allergies: Negative.  Negative for environmental allergies and polydipsia. Does not bruise/bleed easily.  Psychiatric/Behavioral: Negative.  Negative for depression, hallucinations, memory loss, substance abuse and suicidal ideas. The patient is not nervous/anxious and does not have insomnia.     Vital Signs: Blood pressure 138/66, pulse 94, temperature 98.9 F (37.2 C), temperature source Oral, resp. rate 18, height 5\' 4"  (1.626 m), weight 102.7 kg (226 lb 8 oz), last menstrual period 02/14/2017, SpO2 97 %.  Weight trends: Filed Weights   03/26/17 2253 03/27/17 0543 03/28/17 0457  Weight: 99.8 kg (220 lb) 102.7 kg (226 lb 8 oz) 102.7 kg (226 lb 8 oz)    Physical Exam: General: NAD,   Head: Normocephalic, atraumatic. Moist oral mucosal membranes  Eyes: Anicteric, PERRL  Neck: Supple, trachea midline  Lungs:  Clear to auscultation  Heart: Regular rate and rhythm  Abdomen:  Right lower quadrant tenderness  Extremities: no peripheral edema.  Neurologic: Nonfocal, moving all four extremities  Skin: No lesions        Lab results: Basic Metabolic Panel: Recent Labs  Lab 03/26/17 2327 03/27/17 0018 03/27/17 0855 03/27/17 1611 03/28/17 0443  NA 131*  --  135  --  136  K 7.3* 6.3* 6.6* 5.2* 4.6  CL 106  --  114*  --  112*  CO2 16*  --  13*  --  16*   GLUCOSE 318*  --  241*  --  189*  BUN 39*  --  37*  --  30*  CREATININE 2.37*  --  1.98*  --  1.86*  CALCIUM 7.8*  --  7.3*  --  7.1*  MG  --  0.9*  --   --  1.3*    Liver Function Tests: Recent Labs  Lab 03/26/17 2327  AST 29  ALT 13*  ALKPHOS 83  BILITOT 1.1  PROT 8.2*  ALBUMIN 3.6   Recent Labs  Lab 03/26/17 2327  LIPASE 34   No results for input(s): AMMONIA in the last 168 hours.  CBC: Recent Labs  Lab 03/26/17 2327  WBC 15.0*  NEUTROABS 13.7*  HGB 9.2*  HCT 28.4*  MCV 92.2  PLT 140*    Cardiac Enzymes: Recent Labs  Lab 03/27/17 0206  TROPONINI <0.03    BNP: Invalid input(s): POCBNP  CBG: Recent Labs  Lab 03/27/17 1708 03/27/17 2111 03/28/17 0745 03/28/17 1154 03/28/17 1700  GLUCAP 199* 190* 194* 203* 258*    Microbiology: Results for orders placed or performed during the hospital encounter of 03/26/17  Blood Culture (routine x 2)     Status: None (Preliminary result)   Collection Time: 03/27/17  1:06 AM  Result Value Ref Range Status   Specimen Description BLOOD RIGHT FA  Final   Special Requests   Final    BOTTLES DRAWN AEROBIC AND ANAEROBIC Blood Culture results may not be optimal due to an excessive volume of blood received in culture bottles   Culture   Final    NO GROWTH 1 DAY Performed at [facility], [address] [place], [place] [address]    Report Status PENDING  Incomplete  Blood Culture (routine x 2)     Status: None (Preliminary result)   Collection Time: 03/27/17  2:06 AM  Result Value Ref Range Status   Specimen Description BLOOD LEFT FA  Final   Special Requests   Final    BOTTLES DRAWN AEROBIC AND ANAEROBIC Blood Culture adequate volume   Culture   Final    NO GROWTH 1 DAY Performed at [facility], [address], [place], [place] [address]    Report Status PENDING  Incomplete  Urine culture     Status: None   Collection Time: 03/27/17  2:22 AM  Result Value Ref Range Status   Specimen  Description   Final    URINE, RANDOM Performed at [facility], [address], [place], [place] [address]    Special Requests   Final    NONE Performed at [facility], [address] [place], [place] [address]    Culture   Final    NO GROWTH Performed at [facility], [place] [address] [place], [place] [address]    Report Status 03/28/2017 FINAL  Final    Coagulation Studies: Recent Labs    03/27/17 0206  LABPROT 23.9*  INR 2.16    Urinalysis: Recent Labs    03/27/17 0222  COLORURINE YELLOW*  LABSPEC 1.011  PHURINE 5.0  GLUCOSEU NEGATIVE  HGBUR NEGATIVE  BILIRUBINUR NEGATIVE  KETONESUR NEGATIVE  PROTEINUR NEGATIVE  NITRITE NEGATIVE  LEUKOCYTESUR NEGATIVE      Imaging: Ct Abdomen Pelvis Wo Contrast  Result Date: 03/27/2017 CLINICAL DATA:  52 year old female with abdominal pain. Concern for acute diverticulitis. EXAM: CT ABDOMEN AND PELVIS WITHOUT CONTRAST TECHNIQUE: Multidetector CT imaging of the abdomen and pelvis was performed following the standard protocol without IV contrast. COMPARISON:  Abdominal MRI dated  11/13/2014 and ultrasound dated 11/12/2014 FINDINGS: Evaluation of this exam is limited in the absence of intravenous contrast. Lower chest: The visualized lung bases are clear. No intra-abdominal free air or free fluid. Hepatobiliary: Probable mild fatty infiltration of the liver. Mild enlargement of the left lobe of the liver may represent cirrhosis. No intrahepatic biliary ductal dilatation. Cholecystectomy. Pancreas: Unremarkable. No pancreatic ductal dilatation or surrounding inflammatory changes. Spleen: The spleen is enlarged measuring up to 16 cm in greatest length. Adrenals/Urinary Tract: The adrenal glands are unremarkable. There is no hydronephrosis or nephrolithiasis on either side. The visualized ureters and urinary bladder appear unremarkable. Stomach/Bowel: There is colonic diverticulosis without active inflammatory  changes. Multiple normal caliber fluid-filled loops of small bowel noted which may be physiologic or represent enteritis. No bowel obstruction. Loose stool noted throughout the colon compatible with diarrheal state. Correlation with clinical exam and stool cultures recommended the appendix is normal. Vascular/Lymphatic: Mild aortoiliac atherosclerotic disease. The abdominal aorta and IVC are otherwise grossly unremarkable on this noncontrast CT. No portal venous gas. There is no adenopathy. Recanalized appearance of the paraumbilical vein suggestive of portal hypertension. Reproductive: The uterus is anteverted. Bilateral ovarian cysts measure up to 3.7 cm on the left. There is a complex and multi septated right adnexal mass measuring 7.6 x 6.5 x 7.9 cm. This mass predominantly composed of fatty tissue with a small focus of calcification most consistent with a teratoma. Please note this can predispose the ovary to an increased risk for torsion. If there is clinical concern for ovarian torsion further evaluation with pelvic ultrasound with duplex interrogation of ovarian flow recommended. Malignant transformation of teratoma is not evaluated on CT. Further evaluation with MRI or surgical consult to determine treatment options recommended. Other: None Musculoskeletal: No acute or significant osseous findings. IMPRESSION: 1. Diarrheal state with findings of possible enteritis. Correlation with clinical exam and stool cultures recommended no bowel obstruction. Normal appendix. 2. Colonic diverticulosis without active inflammatory changes. 3. 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 4. Right ovarian teratoma. Further evaluation with MRI or surgical consult is advised. If there is clinical concern for right ovarian torsion further evaluation with pelvic ultrasound recommended. Electronically Signed   By: Anner Crete M.D.   On: 03/27/2017 02:14   US Pelvis Complete  Result Date:  03/27/2017 CLINICAL DATA:  52 y/o  F; right pelvic pain for 2 weeks. EXAM: TRANSABDOMINAL ULTRASOUND OF PELVIS DOPPLER ULTRASOUND OF OVARIES TECHNIQUE: Transabdominal ultrasound examination of the pelvis was performed including evaluation of the uterus, ovaries, adnexal regions, and pelvic cul-de-sac. Color and duplex Doppler ultrasound was utilized to evaluate blood flow to the ovaries. COMPARISON:  03/27/2017 CT abdomen and pelvis. FINDINGS: Uterus Measurements: 10.5 x 5.5 x 5.2 cm. No fibroids or other mass visualized. Endometrium Thickness: 12 mm.  Poorly visualized. Right ovary Measurements: 9.1 x 5.1 x 9.4 cm. Enlarged complex ovarian mass with solid central component measuring up to 5.7 cm on ultrasound, partially visualized, corresponding to teratoma on CT. Left ovary Measurements: 5.2 x 3.7 x 3.8 cm. Small avascular simple appearing cysts. Pulsed Doppler evaluation demonstrates normal low-resistance arterial and venous waveforms in both ovaries. IMPRESSION: 1. Patient refused transvaginal exam. Exam limited by body habitus and bowel gas shadow. 2. No evidence of ovarian torsion. 3. Right ovarian mass corresponding to teratoma on prior CT. Electronically Signed   By: Kristine Garbe M.D.   On: 03/27/2017 05:56   US Renal  Result Date: 03/28/2017 CLINICAL DATA:  Acute renal failure.  EXAM: RENAL / URINARY TRACT ULTRASOUND COMPLETE COMPARISON:  Abdominal and pelvic CT scan of March 27 2017 FINDINGS: Right Kidney: Length: 10 cm. The renal cortical echotexture is approximately equal to that of the adjacent liver. There is no hydronephrosis. Left Kidney: Length: 11.4 cm. The renal cortical echotexture is similar to that on the right. There is no hydronephrosis. Bladder: The partially distended urinary bladder is normal. IMPRESSION: Mildly increased renal cortical echotexture bilaterally may reflect medical renal disease. There is no hydronephrosis. Electronically Signed   By: David  Martinique M.D.    On: 03/28/2017 11:24   [place] Art/ven Flow Abd Pelv Doppler  Result Date: 03/27/2017 CLINICAL DATA:  52 y/o  F; right pelvic pain for 2 weeks. EXAM: TRANSABDOMINAL ULTRASOUND OF PELVIS DOPPLER ULTRASOUND OF OVARIES TECHNIQUE: Transabdominal ultrasound examination of the pelvis was performed including evaluation of the uterus, ovaries, adnexal regions, and pelvic cul-de-sac. Color and duplex Doppler ultrasound was utilized to evaluate blood flow to the ovaries. COMPARISON:  03/27/2017 CT abdomen and pelvis. FINDINGS: Uterus Measurements: 10.5 x 5.5 x 5.2 cm. No fibroids or other mass visualized. Endometrium Thickness: 12 mm.  Poorly visualized. Right ovary Measurements: 9.1 x 5.1 x 9.4 cm. Enlarged complex ovarian mass with solid central component measuring up to 5.7 cm on ultrasound, partially visualized, corresponding to teratoma on CT. Left ovary Measurements: 5.2 x 3.7 x 3.8 cm. Small avascular simple appearing cysts. Pulsed Doppler evaluation demonstrates normal low-resistance arterial and venous waveforms in both ovaries. IMPRESSION: 1. Patient refused transvaginal exam. Exam limited by body habitus and bowel gas shadow. 2. No evidence of ovarian torsion. 3. Right ovarian mass corresponding to teratoma on prior CT. Electronically Signed   By: Kristine Garbe M.D.   On: 03/27/2017 05:56   Dg Chest Portable 1 View  Result Date: 03/27/2017 CLINICAL DATA:  Right lower abdominal pain, sepsis EXAM: PORTABLE CHEST 1 VIEW COMPARISON:  11/12/2014 FINDINGS: Heart and mediastinal contours are within normal limits. No focal opacities or effusions. No acute bony abnormality. IMPRESSION: No active disease. Electronically Signed   By: Rolm Baptise M.D.   On: 03/27/2017 00:55      Assessment & Plan: Ms. Selena Bennett is a 52 y.o. 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 Acute renal failure with hyperkalemia: on chronic kidney disease stage III. Baseline creatinine of 1.3 GFR of 55 in 2016. No newer creatinine. Bland urine Chronic kidney disease secondary to hypertension and diabetes Acute renal failure seems secondary to acute prerenal azotemia.  - Improved with IV fluids.  - Cleared from a nephrology standpoint for surgery.   2. Hypertension: blood pressure at goal - holding lisinopril and hydrochlorothiazide.   3. Diabetes mellitus type II with chronic kidney disease: on metformin - Holding metformin.     LOS: 1 Niti Leisure 2/27/20198:14 PM

## 2017-03-28 NOTE — Progress Notes (Addendum)
Inpatient Diabetes Program Recommendations  AACE/ADA: New Consensus Statement on Inpatient Glycemic Control (2015)  Target Ranges:  Prepandial:   less than 140 mg/dL      Peak postprandial:   less than 180 mg/dL (1-2 hours)      Critically ill patients:  140 - 180 mg/dL   Results for ALGIE, CALES (MRN [id number]) as of 03/28/2017 08:38  Ref. Range 03/27/2017 07:52 03/27/2017 11:48 03/27/2017 17:08 03/27/2017 21:11 03/28/2017 07:45  Glucose-Capillary Latest Ref Range: 65 - 99 mg/dL 232 (H) 252 (H) 199 (H) 190 (H) 194 (H)  Results for MIDGE, MOMON (MRN [id number]) as of 03/28/2017 08:38  Ref. Range 03/27/2017 06:00  Hemoglobin A1C Latest Ref Range: 4.8 - 5.6 % 8.5 (H)   Review of Glycemic Control  Diabetes history: DM2 Outpatient Diabetes medications: Glipizide XL 10 mg daily, Tradjenta 5 mg daily, Glumetza 500 mg BID Current orders for Inpatient glycemic control: Lantus 10 units daily, Novolog 0-9 units TID with meals  Inpatient Diabetes Program Recommendations: Insulin - Basal: Please consider increasing Lantus to 13 units daily. Correction (SSI): Please consider adding Novolog 0-5 units QHS for bedtime correction scale. HgbA1C: A1C 8.5% on 03/27/17 indicating an average glucose of 197 mg/dl over the past 2-3 months.  Thanks, Barnie Alderman, RN, MSN, CDE Diabetes Coordinator Inpatient Diabetes Program [phone] (Team Pager from 8am to 5pm)

## 2017-03-29 LAB — CBC WITH DIFFERENTIAL/PLATELET
Basophils Absolute: 0 10*3/uL (ref 0–0.1)
Basophils Relative: 0 %
Eosinophils Absolute: 0.1 10*3/uL (ref 0–0.7)
Eosinophils Relative: 1 %
HCT: 25.2 % — ABNORMAL LOW (ref 35.0–47.0)
HEMOGLOBIN: 8.3 g/dL — AB (ref 12.0–16.0)
LYMPHS ABS: 0.8 10*3/uL — AB (ref 1.0–3.6)
Lymphocytes Relative: 10 %
MCH: 30.4 pg (ref 26.0–34.0)
MCHC: 33 g/dL (ref 32.0–36.0)
MCV: 92 fL (ref 80.0–100.0)
MONO ABS: 0.6 10*3/uL (ref 0.2–0.9)
MONOS PCT: 7 %
NEUTROS ABS: 6.6 10*3/uL — AB (ref 1.4–6.5)
NEUTROS PCT: 82 %
Platelets: 126 10*3/uL — ABNORMAL LOW (ref 150–440)
RBC: 2.74 MIL/uL — ABNORMAL LOW (ref 3.80–5.20)
RDW: 14.1 % (ref 11.5–14.5)
WBC: 8 10*3/uL (ref 3.6–11.0)

## 2017-03-29 LAB — BASIC METABOLIC PANEL
Anion gap: 8 (ref 5–15)
BUN: 21 mg/dL — AB (ref 6–20)
CHLORIDE: 115 mmol/L — AB (ref 101–111)
CO2: 16 mmol/L — ABNORMAL LOW (ref 22–32)
CREATININE: 1.72 mg/dL — AB (ref 0.44–1.00)
Calcium: 7 mg/dL — ABNORMAL LOW (ref 8.9–10.3)
GFR calc Af Amer: 39 mL/min — ABNORMAL LOW (ref >60)
GFR calc non Af Amer: 33 mL/min — ABNORMAL LOW (ref >60)
GLUCOSE: 156 mg/dL — AB (ref 65–99)
POTASSIUM: 4.3 mmol/L (ref 3.5–5.1)
SODIUM: 139 mmol/L (ref 135–145)

## 2017-03-29 LAB — RENAL FUNCTION PANEL
ANION GAP: 8 (ref 5–15)
Albumin: 2.6 g/dL — ABNORMAL LOW (ref 3.5–5.0)
BUN: 21 mg/dL — ABNORMAL HIGH (ref 6–20)
CHLORIDE: 115 mmol/L — AB (ref 101–111)
CO2: 15 mmol/L — AB (ref 22–32)
Calcium: 7 mg/dL — ABNORMAL LOW (ref 8.9–10.3)
Creatinine, Ser: 1.68 mg/dL — ABNORMAL HIGH (ref 0.44–1.00)
GFR calc non Af Amer: 34 mL/min — ABNORMAL LOW (ref >60)
GFR, EST AFRICAN AMERICAN: 40 mL/min — AB (ref >60)
GLUCOSE: 156 mg/dL — AB (ref 65–99)
Phosphorus: 1.6 mg/dL — ABNORMAL LOW (ref 2.5–4.6)
Potassium: 4.3 mmol/L (ref 3.5–5.1)
SODIUM: 138 mmol/L (ref 135–145)

## 2017-03-29 LAB — GLUCOSE, CAPILLARY
Glucose-Capillary: 164 mg/dL — ABNORMAL HIGH (ref 65–99)
Glucose-Capillary: 208 mg/dL — ABNORMAL HIGH (ref 65–99)
Glucose-Capillary: 162 mg/dL — ABNORMAL HIGH (ref 65–99)
Glucose-Capillary: 146 mg/dL — ABNORMAL HIGH (ref 65–99)

## 2017-03-29 LAB — CA 125: CANCER ANTIGEN (CA) 125: 20.4 U/mL (ref 0.0–38.1)

## 2017-03-29 LAB — PHOSPHORUS: PHOSPHORUS: 1.6 mg/dL — AB (ref 2.5–4.6)

## 2017-03-29 LAB — FOLLICLE STIMULATING HORMONE: FSH: 6.3 m[IU]/mL

## 2017-03-29 LAB — PREPARE RBC (CROSSMATCH)

## 2017-03-29 LAB — MAGNESIUM: Magnesium: 1.7 mg/dL (ref 1.7–2.4)

## 2017-03-29 MED ORDER — PANTOPRAZOLE SODIUM 40 MG PO PACK
20.0000 mg | PACK | Freq: Every day | ORAL | Status: DC
  Filled 2017-03-29: qty 20

## 2017-03-29 MED ORDER — K PHOS MONO-SOD PHOS DI & MONO 155-852-130 MG PO TABS
500.0000 mg | ORAL_TABLET | ORAL | Status: AC
  Administered 2017-03-29 (×3): 500 mg via ORAL
  Filled 2017-03-29 (×3): qty 2

## 2017-03-29 MED ORDER — BUPIVACAINE LIPOSOME 1.3 % IJ SUSP
20.0000 mL | Freq: Once | INTRAMUSCULAR | Status: DC
  Filled 2017-03-29: qty 20

## 2017-03-29 MED ORDER — CEFAZOLIN SODIUM-DEXTROSE 2-4 GM/100ML-% IV SOLN
2.0000 g | Freq: Once | INTRAVENOUS | Status: AC
  Administered 2017-03-30: 2 g via INTRAVENOUS
  Filled 2017-03-29: qty 100

## 2017-03-29 MED ORDER — PANTOPRAZOLE SODIUM 40 MG PO TBEC
40.0000 mg | DELAYED_RELEASE_TABLET | Freq: Every day | ORAL | Status: DC
  Administered 2017-03-29 – 2017-04-01 (×4): 40 mg via ORAL
  Filled 2017-03-29 (×4): qty 1

## 2017-03-29 MED ORDER — CALCIUM CARBONATE ANTACID 500 MG PO CHEW
400.0000 mg | CHEWABLE_TABLET | Freq: Two times a day (BID) | ORAL | Status: DC
  Administered 2017-03-29 – 2017-04-01 (×6): 400 mg via ORAL
  Filled 2017-03-29 (×6): qty 2

## 2017-03-29 MED ORDER — DIPHENHYDRAMINE HCL 25 MG PO CAPS
25.0000 mg | ORAL_CAPSULE | Freq: Once | ORAL | Status: AC
  Administered 2017-03-29: 15:00:00 25 mg via ORAL
  Filled 2017-03-29: qty 1

## 2017-03-29 MED ORDER — MAGNESIUM SULFATE 2 GM/50ML IV SOLN
2.0000 g | Freq: Once | INTRAVENOUS | Status: AC
  Administered 2017-03-29: 2 g via INTRAVENOUS
  Filled 2017-03-29: qty 50

## 2017-03-29 MED ORDER — SODIUM CHLORIDE 0.9 % IV SOLN
Freq: Once | INTRAVENOUS | Status: AC
  Administered 2017-03-29: 15:00:00 via INTRAVENOUS

## 2017-03-29 MED ORDER — ACETAMINOPHEN 325 MG PO TABS
650.0000 mg | ORAL_TABLET | Freq: Once | ORAL | Status: AC
  Administered 2017-03-29: 650 mg via ORAL
  Filled 2017-03-29: qty 2

## 2017-03-29 NOTE — Progress Notes (Signed)
Subjective: Patient reports pain still RLQ . Requiring morphine  Nephrology consult complete .      Objective: I have reviewed patient's vital signs, medications and labs. Results for orders placed or performed during the hospital encounter of 03/26/17 (from the past 24 hour(s))  Glucose, capillary     Status: Abnormal   Collection Time: 03/28/17  5:00 PM  Result Value Ref Range   Glucose-Capillary 258 (H) 65 - 99 mg/dL  Glucose, capillary     Status: Abnormal   Collection Time: 03/28/17  8:51 PM  Result Value Ref Range   Glucose-Capillary 209 (H) 65 - 99 mg/dL  Magnesium     Status: None   Collection Time: 03/28/17 11:19 PM  Result Value Ref Range   Magnesium 1.8 1.7 - 2.4 mg/dL  Basic metabolic panel     Status: Abnormal   Collection Time: 03/29/17  5:54 AM  Result Value Ref Range   Sodium 139 135 - 145 mmol/L   Potassium 4.3 3.5 - 5.1 mmol/L   Chloride 115 (H) 101 - 111 mmol/L   CO2 16 (L) 22 - 32 mmol/L   Glucose, Bld 156 (H) 65 - 99 mg/dL   BUN 21 (H) 6 - 20 mg/dL   Creatinine, Ser 1.72 (H) 0.44 - 1.00 mg/dL   Calcium 7.0 (L) 8.9 - 10.3 mg/dL   GFR calc non Af Amer 33 (L) >60 mL/min   GFR calc Af Amer 39 (L) >60 mL/min   Anion gap 8 5 - 15  Renal function panel     Status: Abnormal   Collection Time: 03/29/17  5:54 AM  Result Value Ref Range   Sodium 138 135 - 145 mmol/L   Potassium 4.3 3.5 - 5.1 mmol/L   Chloride 115 (H) 101 - 111 mmol/L   CO2 15 (L) 22 - 32 mmol/L   Glucose, Bld 156 (H) 65 - 99 mg/dL   BUN 21 (H) 6 - 20 mg/dL   Creatinine, Ser 1.68 (H) 0.44 - 1.00 mg/dL   Calcium 7.0 (L) 8.9 - 10.3 mg/dL   Phosphorus 1.6 (L) 2.5 - 4.6 mg/dL   Albumin 2.6 (L) 3.5 - 5.0 g/dL   GFR calc non Af Amer 34 (L) >60 mL/min   GFR calc Af Amer 40 (L) >60 mL/min   Anion gap 8 5 - 15  CBC with Differential/Platelet     Status: Abnormal   Collection Time: 03/29/17  5:54 AM  Result Value Ref Range   WBC 8.0 3.6 - 11.0 K/uL   RBC 2.74 (L) 3.80 - 5.20 MIL/uL   Hemoglobin  8.3 (L) 12.0 - 16.0 g/dL   HCT 25.2 (L) 35.0 - 47.0 %   MCV 92.0 80.0 - 100.0 fL   MCH 30.4 26.0 - 34.0 pg   MCHC 33.0 32.0 - 36.0 g/dL   RDW 14.1 11.5 - 14.5 %   Platelets 126 (L) 150 - 440 K/uL   Neutrophils Relative % 82 %   Neutro Abs 6.6 (H) 1.4 - 6.5 K/uL   Lymphocytes Relative 10 %   Lymphs Abs 0.8 (L) 1.0 - 3.6 K/uL   Monocytes Relative 7 %   Monocytes Absolute 0.6 0.2 - 0.9 K/uL   Eosinophils Relative 1 %   Eosinophils Absolute 0.1 0 - 0.7 K/uL   Basophils Relative 0 %   Basophils Absolute 0.0 0 - 0.1 K/uL  Magnesium     Status: None   Collection Time: 03/29/17  5:54 AM  Result Value Ref  Range   Magnesium 1.7 1.7 - 2.4 mg/dL  Phosphorus     Status: Abnormal   Collection Time: 03/29/17  5:54 AM  Result Value Ref Range   Phosphorus 1.6 (L) 2.5 - 4.6 mg/dL  Glucose, capillary     Status: Abnormal   Collection Time: 03/29/17  7:40 AM  Result Value Ref Range   Glucose-Capillary 164 (H) 65 - 99 mg/dL  Glucose, capillary     Status: Abnormal   Collection Time: 03/29/17 11:58 AM  Result Value Ref Range   Glucose-Capillary 208 (H) 65 - 99 mg/dL   Lungs CTA  CV RRR   abdomen . Mild TTP RLQ , no rebound    Assessment/Plan: Abdominal pain RLQ secondary to ovarian mass  C/w teratoma  PMB  Anemia  Pt is scheduled for exlap and BSO  And Fractional Dilation and curretage  She has been counseled for the procedure including the risks . I will transfuse 1 unit blood today before surgery  All questions answered and consen NPO [id number] signed   LOS: 2 days    Selena Bennett 03/29/2017, 12:18 PM

## 2017-03-29 NOTE — Progress Notes (Signed)
Inpatient Diabetes Program Recommendations  AACE/ADA: New Consensus Statement on Inpatient Glycemic Control (2015)  Target Ranges:  Prepandial:   less than 140 mg/dL      Peak postprandial:   less than 180 mg/dL (1-2 hours)      Critically ill patients:  140 - 180 mg/dL   Results for LESHA, JAGER (MRN [id number]) as of 03/29/2017 08:23  Ref. Range 03/28/2017 07:45 03/28/2017 11:54 03/28/2017 17:00 03/28/2017 20:51 03/29/2017 07:40  Glucose-Capillary Latest Ref Range: 65 - 99 mg/dL 194 (H) 203 (H) 258 (H) 209 (H) 164 (H)   Review of Glycemic Control Diabetes history:DM2 Outpatient Diabetes medications:Glipizide XL 10 mg daily, Tradjenta 5 mg daily, Glumetza 500 mg BID Current orders for Inpatient glycemic control:Lantus 13 units daily, Novolog 0-9 units TID with meals, Novolog 0-5 units QHS  Inpatient Diabetes Program Recommendations: Insulin - Basal: In reveiwing chart, noted patient did NOT receive any Lantus yesterday. Lantus 10 units that was ordered was charted as NOT GIVEN due to dose change and the new order for Lantus 13 units was ordered on 03/28/17 but to start on 03/29/17. Do not recommend any other changes with insulin orders today as patient should recevie the Lantus 13 units today at noon. HgbA1C:A1C 8.5% on 03/27/17 indicating an average glucose of 197 mg/dl over the past 2-3 months.  Thanks, Barnie Alderman, RN, MSN, CDE Diabetes Coordinator Inpatient Diabetes Program [phone] (Team Pager from 8am to 5pm)

## 2017-03-29 NOTE — Care Management Note (Signed)
Case Management Note  Patient Details  Name: Selena Bennett MRN: [id number] Date of Birth: 05-23-1965  Subjective/Objective:    Admitted to [facility] with the diagnosis of acute kidney injury. Lives with husband,  [name], [phone]). Sees Dr. Clide Deutscher at [facility]. Prescriptions are filled at the clinic. No home health. No skilled facility. No home oxygen.  Takes care of all basic activities of daily living herself. No falls. Good appetite.     Npo tonight. Scheduled for Exploratory Laparotomy with BSO tomorrow            Action/Plan: Will continue to follow for discharge plans   Expected Discharge Date:                  Expected Discharge Plan:     In-House Referral:   yes  Discharge planning Services   yes  Post Acute Care Choice:    Choice offered to:     DME Arranged:    DME Agency:     HH Arranged:    HH Agency:     Status of Service:     If discussed at [place] of Stay Meetings, dates discussed:    Additional Comments:  Shelbie Ammons, RN MSN CCM Care Management [phone] 03/29/2017, 3:17 PM

## 2017-03-29 NOTE — Progress Notes (Signed)
[name], female   DOB: 08-Mar-1965, 52 y.o.   MRN: [id number]  [employer] PROGRESS NOTE  Selena Bennett:[id number] DOB: December 22, 1965 DOA: 03/26/2017 PCP: Patient, No Pcp Per  HPI/Subjective: Patient feeling better than when she came in.  Had uncontrolled diarrhea today.  Quite a few bowel movements overnight.  As per nursing staff received Kayexalate but did not have a bowel movement yesterday.  Also received Colace and magnesium.  Objective: Vitals:   03/29/17 1200 03/29/17 1517  BP: (!) 130/57 (!) 142/62  Pulse: 90 85  Resp:  16  Temp: 98.5 F (36.9 C) 98.6 F (37 C)  SpO2: 97% 97%    Filed Weights   03/27/17 0543 03/28/17 0457 03/29/17 0507  Weight: 102.7 kg (226 lb 8 oz) 102.7 kg (226 lb 8 oz) 107 kg (235 lb 14.3 oz)    ROS: Review of Systems  Constitutional: Negative for chills and fever.  Eyes: Negative for blurred vision.  Respiratory: Positive for shortness of breath. Negative for cough.   Cardiovascular: Negative for chest pain.  Gastrointestinal: Positive for abdominal pain and diarrhea. Negative for constipation, nausea and vomiting.  Genitourinary: Negative for dysuria.  Musculoskeletal: Negative for joint pain.  Neurological: Negative for dizziness and headaches.   Exam: Physical Exam  Constitutional: She is oriented to person, place, and time.  HENT:  Nose: No mucosal edema.  Mouth/Throat: No oropharyngeal exudate or posterior oropharyngeal edema.  Eyes: Conjunctivae, EOM and lids are normal. Pupils are equal, round, and reactive to light.  Neck: No JVD present. Carotid bruit is not present. No edema present. No thyroid mass and no thyromegaly present.  Cardiovascular: S1 normal and S2 normal. Exam reveals no gallop.  No murmur heard. Pulses:      Dorsalis pedis pulses are 2+ on the right side, and 2+ on the left side.  Respiratory: No respiratory distress. She has no wheezes. She has no rhonchi. She has no rales.  GI: Soft. Bowel  sounds are normal. There is no tenderness.  Musculoskeletal:       Right ankle: She exhibits no swelling.       Left ankle: She exhibits no swelling.  Lymphadenopathy:    She has no cervical adenopathy.  Neurological: She is alert and oriented to person, place, and time. No cranial nerve deficit.  Skin: Skin is warm. No rash noted. Nails show no clubbing.  Psychiatric: She has a normal mood and affect.      Data Reviewed: Basic Metabolic Panel: Recent Labs  Lab 03/26/17 2327 03/27/17 0018 03/27/17 0254 03/27/17 1611 03/28/17 0443 03/28/17 2319 03/29/17 0554  NA 131*  --  135  --  136  --  138  139  K 7.3* 6.3* 6.6* 5.2* 4.6  --  4.3  4.3  CL 106  --  114*  --  112*  --  115*  115*  CO2 16*  --  13*  --  16*  --  15*  16*  GLUCOSE 318*  --  241*  --  189*  --  156*  156*  BUN 39*  --  37*  --  30*  --  21*  21*  CREATININE 2.37*  --  1.98*  --  1.86*  --  1.68*  1.72*  CALCIUM 7.8*  --  7.3*  --  7.1*  --  7.0*  7.0*  MG  --  0.9*  --   --  1.3* 1.8 1.7  PHOS  --   --   --   --   --   --  1.6*  1.6*   Liver Function Tests: Recent Labs  Lab 03/26/17 2327 03/29/17 0554  AST 29  --   ALT 13*  --   ALKPHOS 83  --   BILITOT 1.1  --   PROT 8.2*  --   ALBUMIN 3.6 2.6*   Recent Labs  Lab 03/26/17 2327  LIPASE 34   CBC: Recent Labs  Lab 03/26/17 2327 03/29/17 0554  WBC 15.0* 8.0  NEUTROABS 13.7* 6.6*  HGB 9.2* 8.3*  HCT 28.4* 25.2*  MCV 92.2 92.0  PLT 140* 126*   Cardiac Enzymes: Recent Labs  Lab 03/27/17 0206  TROPONINI <0.03    CBG: Recent Labs  Lab 03/28/17 1154 03/28/17 1700 03/28/17 2051 03/29/17 0740 03/29/17 1158  GLUCAP 203* 258* 209* 164* 208*    Recent Results (from the past 240 hour(s))  Blood Culture (routine x 2)     Status: None (Preliminary result)   Collection Time: 03/27/17  1:06 AM  Result Value Ref Range Status   Specimen Description BLOOD RIGHT FA  Final   Special Requests   Final    BOTTLES DRAWN AEROBIC AND  ANAEROBIC Blood Culture results may not be optimal due to an excessive volume of blood received in culture bottles   Culture   Final    NO GROWTH 2 DAYS Performed at [facility], [address] [place], [place] [address]    Report Status PENDING  Incomplete  Blood Culture (routine x 2)     Status: None (Preliminary result)   Collection Time: 03/27/17  2:06 AM  Result Value Ref Range Status   Specimen Description BLOOD LEFT FA  Final   Special Requests   Final    BOTTLES DRAWN AEROBIC AND ANAEROBIC Blood Culture adequate volume   Culture   Final    NO GROWTH 2 DAYS Performed at [facility], [address], [place], [place] [address]    Report Status PENDING  Incomplete  Urine culture     Status: None   Collection Time: 03/27/17  2:22 AM  Result Value Ref Range Status   Specimen Description   Final    URINE, RANDOM Performed at [facility], [address] [place], [place] [address]    Special Requests   Final    NONE Performed at [facility], [address] [place], [place] [address]    Culture   Final    NO GROWTH Performed at [facility], [place] [address], [place], [place] [address]    Report Status 03/28/2017 FINAL  Final     Studies: US Renal  Result Date: 03/28/2017 CLINICAL DATA:  Acute renal failure. EXAM: RENAL / URINARY TRACT ULTRASOUND COMPLETE COMPARISON:  Abdominal and pelvic CT scan of March 27 2017 FINDINGS: Right Kidney: Length: 10 cm. The renal cortical echotexture is approximately equal to that of the adjacent liver. There is no hydronephrosis. Left Kidney: Length: 11.4 cm. The renal cortical echotexture is similar to that on the right. There is no hydronephrosis. Bladder: The partially distended urinary bladder is normal. IMPRESSION: Mildly increased renal cortical echotexture bilaterally may reflect medical renal disease. There is no hydronephrosis. Electronically Signed   By: David  Martinique M.D.   On:  03/28/2017 11:24    Scheduled Meds: . [START ON 03/30/2017] bupivacaine liposome  20 mL Infiltration Once  . calcium carbonate  400 mg of elemental calcium Oral BID  . insulin aspart  0-5 Units Subcutaneous QHS  . insulin aspart  0-9 Units Subcutaneous TID WC  . insulin glargine  13 Units Subcutaneous Daily  . metoprolol tartrate  50 mg Oral BID  . pantoprazole  40 mg Oral Daily  . phosphorus  500 mg Oral Q4H   Continuous Infusions: . sodium chloride Stopped (03/29/17 1512)  . [START ON 03/30/2017]  ceFAZolin (ANCEF) IV    . cefTRIAXone (ROCEPHIN)  IV Stopped (03/28/17 1754)  . metronidazole Stopped (03/29/17 1501)    Assessment/Plan:   1. Clinical sepsis present on admission.  Not quite sure when on treating.  Patient on Rocephin and Flagyl.  So far cultures are negative. 2. Acute kidney injury.  Creatinine improved from 1.98-1.68.  Could have some underlying chronic kidney disease. 3. Hyperkalemia on presentation.  This has improved. 4. Essential hypertension on metoprolol 5. Type 2 diabetes mellitus on glargine insulin 13 units daily with sliding scale. 6. Right lower quadrant abdominal pain secondary to ovarian mass consistent with teratoma.  Patient to have a exploratory laparoscopy and removal of ovarian mass tomorrow  Code Status:     Code Status Orders  (From admission, onward)        Start     Ordered   03/27/17 0547  Full code  Continuous     03/27/17 0546    Code Status History    Date Active Date Inactive Code Status Order ID Comments User Context   11/11/2014 23:46 11/16/2014 20:47 Full Code [id number]  Fritzi Mandes, MD Inpatient   04/21/2013 03:10 04/24/2013 00:03 Full Code [id number]  Rise Patience, MD Inpatient   01/28/2013 11:25 02/05/2013 20:38 Full Code [id number]  Donita Brooks, NP Inpatient      Disposition Plan: Potentially home on Saturday if everything goes well with surgery  tomorrow.  Consultants:  Gynecology  Antibiotics:  Rocephin  And Flagyl  Time spent: 26 minutes  [place]

## 2017-03-29 NOTE — Progress Notes (Addendum)
MEDICATION RELATED CONSULT NOTE - INITIAL   Pharmacy Consult for electrolyte management Indication: hypomagnesemia  No Known Allergies  Patient Measurements: Height: 5\' 4"  (162.6 cm) Weight: 235 lb 14.3 oz (107 kg) IBW/kg (Calculated) : 54.7 Adjusted Body Weight:   Sodium (mmol/L)  Date Value  03/29/2017 139  03/29/2017 138  01/18/2014 136   Potassium (mmol/L)  Date Value  03/29/2017 4.3  03/29/2017 4.3  01/18/2014 3.0 (L)   Magnesium (mg/dL)  Date Value  03/29/2017 1.7   Phosphorus (mg/dL)  Date Value  03/29/2017 1.6 (L)  03/29/2017 1.6 (L)   Calcium (mg/dL)  Date Value  03/29/2017 7.0 (L)  03/29/2017 7.0 (L)   Calcium, Total (mg/dL)  Date Value  01/18/2014 8.4 (L)   Albumin (g/dL)  Date Value  03/29/2017 2.6 (L)  01/18/2014 2.9 (L)  ]  Estimated Creatinine Clearance: 46.2 mL/min (A) (by C-G formula based on SCr of 1.72 mg/dL (H)).    Assessment: 52 yof cc lower abdominal pain presented with diarrhea now resolved. Electrolyte abnormalities noted. Pharmacy consulted to manage electrolytes.  Goal of Therapy:  Electrolytes WNL.  Plan:  2/28 Phos: 1.6, Mg: 1.7 and corrected calcium 8.12.  Will order Mag Sulfate IV 2g x 1 dose, Phos neutral tabs 500mg  every 4 hours x 3 doses, and Oral calcium carbonate 800mg  elemental Ca daily.  Will recheck all electrolytes with AM labs.   Caprisha Bridgett M Reyanna Baley, Pharm.D., BCPS Clinical Pharmacist 03/29/2017,8:01 AM

## 2017-03-30 ENCOUNTER — Inpatient Hospital Stay: Payer: Self-pay | Admitting: Registered Nurse

## 2017-03-30 ENCOUNTER — Encounter
Admission: EM | Disposition: A | Discharge status: Home / Self Care | Payer: Self-pay | Source: Home / Self Care | Attending: Obstetrics and Gynecology

## 2017-03-30 DIAGNOSIS — R1031 Right lower quadrant pain: Secondary | ICD-10-CM

## 2017-03-30 DIAGNOSIS — Z9889 Other specified postprocedural states: Secondary | ICD-10-CM

## 2017-03-30 HISTORY — PX: APPENDECTOMY: SHX54

## 2017-03-30 HISTORY — PX: SALPINGOOPHORECTOMY: SHX82

## 2017-03-30 HISTORY — PX: DILATION AND CURETTAGE OF UTERUS: SHX78

## 2017-03-30 HISTORY — PX: LAPAROTOMY: SHX154

## 2017-03-30 LAB — BASIC METABOLIC PANEL
Anion gap: 7 (ref 5–15)
BUN: 16 mg/dL (ref 6–20)
CALCIUM: 7.4 mg/dL — AB (ref 8.9–10.3)
CO2: 18 mmol/L — ABNORMAL LOW (ref 22–32)
CREATININE: 1.43 mg/dL — AB (ref 0.44–1.00)
Chloride: 113 mmol/L — ABNORMAL HIGH (ref 101–111)
GFR calc Af Amer: 48 mL/min — ABNORMAL LOW (ref >60)
GFR, EST NON AFRICAN AMERICAN: 42 mL/min — AB (ref >60)
Glucose, Bld: 161 mg/dL — ABNORMAL HIGH (ref 65–99)
Potassium: 4.4 mmol/L (ref 3.5–5.1)
SODIUM: 138 mmol/L (ref 135–145)

## 2017-03-30 LAB — CBC
HCT: 25.5 % — ABNORMAL LOW (ref 35.0–47.0)
HEMATOCRIT: 32.1 % — AB (ref 35.0–47.0)
HEMOGLOBIN: 10.7 g/dL — AB (ref 12.0–16.0)
Hemoglobin: 8.6 g/dL — ABNORMAL LOW (ref 12.0–16.0)
MCH: 30.8 pg (ref 26.0–34.0)
MCH: 30.2 pg (ref 26.0–34.0)
MCHC: 33.8 g/dL (ref 32.0–36.0)
MCHC: 33.4 g/dL (ref 32.0–36.0)
MCV: 91 fL (ref 80.0–100.0)
MCV: 90.5 fL (ref 80.0–100.0)
Platelets: 123 10*3/uL — ABNORMAL LOW (ref 150–440)
Platelets: 138 10*3/uL — ABNORMAL LOW (ref 150–440)
RBC: 2.8 MIL/uL — ABNORMAL LOW (ref 3.80–5.20)
RBC: 3.55 MIL/uL — ABNORMAL LOW (ref 3.80–5.20)
RDW: 13.9 % (ref 11.5–14.5)
RDW: 14.8 % — ABNORMAL HIGH (ref 11.5–14.5)
WBC: 5.2 10*3/uL (ref 3.6–11.0)
WBC: 5.8 10*3/uL (ref 3.6–11.0)

## 2017-03-30 LAB — GLUCOSE, CAPILLARY
Glucose-Capillary: 139 mg/dL — ABNORMAL HIGH (ref 65–99)
Glucose-Capillary: 208 mg/dL — ABNORMAL HIGH (ref 65–99)
Glucose-Capillary: 182 mg/dL — ABNORMAL HIGH (ref 65–99)
Glucose-Capillary: 311 mg/dL — ABNORMAL HIGH (ref 65–99)
Glucose-Capillary: 311 mg/dL — ABNORMAL HIGH (ref 65–99)

## 2017-03-30 LAB — PHOSPHORUS: PHOSPHORUS: 2.5 mg/dL (ref 2.5–4.6)

## 2017-03-30 LAB — MAGNESIUM: Magnesium: 1.8 mg/dL (ref 1.7–2.4)

## 2017-03-30 LAB — PREPARE RBC (CROSSMATCH)

## 2017-03-30 SURGERY — SALPINGO-OOPHORECTOMY, OPEN
Anesthesia: General | Wound class: Clean Contaminated

## 2017-03-30 MED ORDER — ROCURONIUM BROMIDE 100 MG/10ML IV SOLN
INTRAVENOUS | Status: DC | PRN
  Administered 2017-03-30: 30 mg via INTRAVENOUS
  Administered 2017-03-30: 50 mg via INTRAVENOUS

## 2017-03-30 MED ORDER — EVICEL 2 ML EX KIT
PACK | CUTANEOUS | Status: AC
Start: 2017-03-30 — End: 2017-03-30
  Filled 2017-03-30: qty 1

## 2017-03-30 MED ORDER — NALOXONE HCL 0.4 MG/ML IJ SOLN
0.4000 mg | INTRAMUSCULAR | Status: DC | PRN
  Filled 2017-03-30: qty 1

## 2017-03-30 MED ORDER — LACTATED RINGERS IV SOLN
INTRAVENOUS | Status: DC
  Administered 2017-03-30 – 2017-03-31 (×3): via INTRAVENOUS

## 2017-03-30 MED ORDER — ONDANSETRON HCL 4 MG/2ML IJ SOLN
INTRAMUSCULAR | Status: DC | PRN
  Administered 2017-03-30: 4 mg via INTRAVENOUS

## 2017-03-30 MED ORDER — ONDANSETRON HCL 4 MG/2ML IJ SOLN: 4.0000 mg | Freq: Four times a day (QID) | INTRAMUSCULAR | Status: DC | PRN

## 2017-03-30 MED ORDER — NALOXONE HCL 0.4 MG/ML IJ SOLN: 0.4000 mg | INTRAMUSCULAR | Status: DC | PRN

## 2017-03-30 MED ORDER — ACETAMINOPHEN 325 MG PO TABS
650.0000 mg | ORAL_TABLET | ORAL | Status: DC | PRN
Start: 2017-03-30 — End: 2017-04-01

## 2017-03-30 MED ORDER — SODIUM CHLORIDE 0.9% FLUSH: 9.0000 mL | INTRAVENOUS | Status: DC | PRN

## 2017-03-30 MED ORDER — MORPHINE SULFATE 2 MG/ML IV SOLN: INTRAVENOUS | Status: DC

## 2017-03-30 MED ORDER — SUGAMMADEX SODIUM 500 MG/5ML IV SOLN
INTRAVENOUS | Status: DC | PRN
  Administered 2017-03-30: 500 mg via INTRAVENOUS

## 2017-03-30 MED ORDER — FENTANYL CITRATE (PF) 100 MCG/2ML IJ SOLN
INTRAMUSCULAR | Status: AC
  Filled 2017-03-30: qty 2

## 2017-03-30 MED ORDER — FENTANYL CITRATE (PF) 100 MCG/2ML IJ SOLN
INTRAMUSCULAR | Status: DC | PRN
  Administered 2017-03-30 (×3): 50 ug via INTRAVENOUS

## 2017-03-30 MED ORDER — LIDOCAINE HCL (CARDIAC) 20 MG/ML IV SOLN
INTRAVENOUS | Status: DC | PRN
  Administered 2017-03-30: 100 mg via INTRAVENOUS

## 2017-03-30 MED ORDER — DIPHENHYDRAMINE HCL 12.5 MG/5ML PO ELIX
12.5000 mg | ORAL_SOLUTION | Freq: Four times a day (QID) | ORAL | Status: DC | PRN
  Filled 2017-03-30: qty 5

## 2017-03-30 MED ORDER — DIPHENHYDRAMINE HCL 50 MG/ML IJ SOLN: 12.5000 mg | Freq: Four times a day (QID) | INTRAMUSCULAR | Status: DC | PRN

## 2017-03-30 MED ORDER — BUPIVACAINE HCL (PF) 0.5 % IJ SOLN
INTRAMUSCULAR | Status: AC
  Filled 2017-03-30: qty 30

## 2017-03-30 MED ORDER — ROCURONIUM BROMIDE 50 MG/5ML IV SOLN
INTRAVENOUS | Status: AC
  Filled 2017-03-30: qty 1

## 2017-03-30 MED ORDER — FENTANYL CITRATE (PF) 100 MCG/2ML IJ SOLN: 25.0000 ug | INTRAMUSCULAR | Status: DC | PRN

## 2017-03-30 MED ORDER — ACETAMINOPHEN 10 MG/ML IV SOLN
INTRAVENOUS | Status: AC
  Filled 2017-03-30: qty 100

## 2017-03-30 MED ORDER — SUGAMMADEX SODIUM 500 MG/5ML IV SOLN
INTRAVENOUS | Status: AC
Start: 2017-03-30 — End: 2017-03-30
  Filled 2017-03-30: qty 5

## 2017-03-30 MED ORDER — DIPHENHYDRAMINE HCL 50 MG/ML IJ SOLN
12.5000 mg | Freq: Four times a day (QID) | INTRAMUSCULAR | Status: DC | PRN
Start: 2017-03-30 — End: 2017-03-30

## 2017-03-30 MED ORDER — PROPOFOL 10 MG/ML IV BOLUS
INTRAVENOUS | Status: AC
  Filled 2017-03-30: qty 40

## 2017-03-30 MED ORDER — OXYCODONE HCL 5 MG PO TABS: 5.0000 mg | ORAL_TABLET | Freq: Once | ORAL | Status: DC | PRN

## 2017-03-30 MED ORDER — ONDANSETRON HCL 4 MG/2ML IJ SOLN
4.0000 mg | Freq: Four times a day (QID) | INTRAMUSCULAR | Status: DC | PRN
  Administered 2017-03-31 – 2017-04-01 (×3): 4 mg via INTRAVENOUS
  Filled 2017-03-30: qty 2

## 2017-03-30 MED ORDER — MIDAZOLAM HCL 2 MG/2ML IJ SOLN
INTRAMUSCULAR | Status: DC | PRN
  Administered 2017-03-30: 2 mg via INTRAVENOUS

## 2017-03-30 MED ORDER — KETOROLAC TROMETHAMINE 30 MG/ML IJ SOLN
INTRAMUSCULAR | Status: AC
  Filled 2017-03-30: qty 1

## 2017-03-30 MED ORDER — SODIUM CHLORIDE 0.9 % IV SOLN: Freq: Once | INTRAVENOUS | Status: DC

## 2017-03-30 MED ORDER — MEPERIDINE HCL 50 MG/ML IJ SOLN: 6.2500 mg | INTRAMUSCULAR | Status: DC | PRN

## 2017-03-30 MED ORDER — SODIUM CHLORIDE 0.9 % IV SOLN
INTRAVENOUS | Status: DC | PRN
  Administered 2017-03-30: 10:00:00 via INTRAVENOUS

## 2017-03-30 MED ORDER — PROPOFOL 10 MG/ML IV BOLUS
INTRAVENOUS | Status: DC | PRN
  Administered 2017-03-30: 140 mg via INTRAVENOUS

## 2017-03-30 MED ORDER — BUPIVACAINE LIPOSOME 1.3 % IJ SUSP
INTRAMUSCULAR | Status: AC
  Filled 2017-03-30: qty 20

## 2017-03-30 MED ORDER — DEXAMETHASONE SODIUM PHOSPHATE 10 MG/ML IJ SOLN
INTRAMUSCULAR | Status: DC | PRN
  Administered 2017-03-30: 10 mg via INTRAVENOUS

## 2017-03-30 MED ORDER — MIDAZOLAM HCL 2 MG/2ML IJ SOLN
INTRAMUSCULAR | Status: AC
  Filled 2017-03-30: qty 2

## 2017-03-30 MED ORDER — PHENYLEPHRINE HCL 10 MG/ML IJ SOLN
INTRAMUSCULAR | Status: DC | PRN
  Administered 2017-03-30 (×5): 100 ug via INTRAVENOUS

## 2017-03-30 MED ORDER — ONDANSETRON HCL 4 MG PO TABS
4.0000 mg | ORAL_TABLET | Freq: Four times a day (QID) | ORAL | Status: DC | PRN
  Filled 2017-03-30: qty 1

## 2017-03-30 MED ORDER — ACETAMINOPHEN 10 MG/ML IV SOLN
INTRAVENOUS | Status: DC | PRN
  Administered 2017-03-30: 1000 mg via INTRAVENOUS

## 2017-03-30 MED ORDER — LACTATED RINGERS IV SOLN
INTRAVENOUS | Status: DC | PRN
  Administered 2017-03-30: 10:00:00 via INTRAVENOUS

## 2017-03-30 MED ORDER — OXYCODONE HCL 5 MG/5ML PO SOLN: 5.0000 mg | Freq: Once | ORAL | Status: DC | PRN

## 2017-03-30 MED ORDER — MAGNESIUM OXIDE 400 (241.3 MG) MG PO TABS
400.0000 mg | ORAL_TABLET | Freq: Two times a day (BID) | ORAL | Status: DC
  Administered 2017-03-30 – 2017-04-01 (×4): 400 mg via ORAL
  Filled 2017-03-30 (×6): qty 1

## 2017-03-30 MED ORDER — SODIUM CHLORIDE 0.9 % IJ SOLN
INTRAMUSCULAR | Status: DC | PRN
  Administered 2017-03-30: 50 mL via INTRAVENOUS

## 2017-03-30 MED ORDER — BUPIVACAINE LIPOSOME 1.3 % IJ SUSP
INTRAMUSCULAR | Status: DC | PRN
  Administered 2017-03-30: 50 mL

## 2017-03-30 MED ORDER — PROMETHAZINE HCL 25 MG/ML IJ SOLN: 6.2500 mg | INTRAMUSCULAR | Status: DC | PRN

## 2017-03-30 MED ORDER — SILVER NITRATE-POT NITRATE 75-25 % EX MISC
CUTANEOUS | Status: DC | PRN
  Administered 2017-03-30: 2

## 2017-03-30 MED ORDER — KETOROLAC TROMETHAMINE 30 MG/ML IJ SOLN
30.0000 mg | Freq: Three times a day (TID) | INTRAMUSCULAR | Status: DC | PRN
  Administered 2017-03-30: 30 mg via INTRAVENOUS
  Filled 2017-03-30: qty 1

## 2017-03-30 MED ORDER — ENOXAPARIN SODIUM 40 MG/0.4ML ~~LOC~~ SOLN
40.0000 mg | SUBCUTANEOUS | Status: DC
  Administered 2017-03-31 – 2017-04-01 (×2): 40 mg via SUBCUTANEOUS
  Filled 2017-03-30 (×2): qty 0.4

## 2017-03-30 MED ORDER — SODIUM CHLORIDE 0.9 % IJ SOLN
INTRAMUSCULAR | Status: AC
  Filled 2017-03-30: qty 50

## 2017-03-30 MED ORDER — MORPHINE SULFATE 2 MG/ML IV SOLN
INTRAVENOUS | Status: DC
  Administered 2017-03-30: 16:00:00 via INTRAVENOUS
  Administered 2017-03-31: 3.43 mL via INTRAVENOUS
  Filled 2017-03-30: qty 30

## 2017-03-30 SURGICAL SUPPLY — 47 items
CANISTER SUCT 1200ML W/VALVE (MISCELLANEOUS) ×5 IMPLANT
CANISTER SUCT 3000ML PPV (MISCELLANEOUS) ×5 IMPLANT
CATH ROBINSON RED A/P 16FR (CATHETERS) ×5 IMPLANT
DISSECTOR KITTNER STICK (MISCELLANEOUS) ×3 IMPLANT
DISSECTORS/KITTNER STICK (MISCELLANEOUS) ×5
DRAPE LAPAROTOMY TRNSV 106X77 (MISCELLANEOUS) ×5 IMPLANT
DRSG TELFA 3X8 NADH (GAUZE/BANDAGES/DRESSINGS) ×5 IMPLANT
ELECT BLADE 6 FLAT ULTRCLN (ELECTRODE) ×5 IMPLANT
ELECT REM PT RETURN 9FT ADLT (ELECTROSURGICAL) ×5
ELECTRODE REM PT RTRN 9FT ADLT (ELECTROSURGICAL) ×3 IMPLANT
EVICEL AIRLESS SPRAY ACCES (MISCELLANEOUS) ×5 IMPLANT
GLOVE BIO SURGEON STRL SZ8 (GLOVE) ×5 IMPLANT
GLOVE INDICATOR 8.0 STRL GRN (GLOVE) ×5 IMPLANT
GOWN STRL REUS W/ TWL LRG LVL3 (GOWN DISPOSABLE) ×3 IMPLANT
GOWN STRL REUS W/ TWL XL LVL3 (GOWN DISPOSABLE) ×3 IMPLANT
GOWN STRL REUS W/TWL LRG LVL3 (GOWN DISPOSABLE) ×2
GOWN STRL REUS W/TWL XL LVL3 (GOWN DISPOSABLE) ×2
IV LACTATED RINGERS 1000ML (IV SOLUTION) ×5 IMPLANT
IV NS 1000ML (IV SOLUTION) ×2
IV NS 1000ML BAXH (IV SOLUTION) ×3 IMPLANT
KIT PREVENA INCISION MGT 13 (CANNISTER) ×5 IMPLANT
KIT TURNOVER CYSTO (KITS) ×5 IMPLANT
LABEL OR SOLS (LABEL) ×5 IMPLANT
PACK BASIN MAJOR ARMC (MISCELLANEOUS) ×5 IMPLANT
PACK DNC HYST (MISCELLANEOUS) ×5 IMPLANT
PAD OB MATERNITY 4.3X12.25 (PERSONAL CARE ITEMS) ×5 IMPLANT
PAD PREP 24X41 OB/GYN DISP (PERSONAL CARE ITEMS) ×5 IMPLANT
SLEEVE PROTECTION STRL DISP (MISCELLANEOUS) ×5 IMPLANT
SPONGE LAP 18X18 5 PK (GAUZE/BANDAGES/DRESSINGS) ×5 IMPLANT
SPONGE XRAY 4X4 16PLY STRL (MISCELLANEOUS) ×5 IMPLANT
STAPLER INSORB 30 2030 C-SECTI (MISCELLANEOUS) ×5 IMPLANT
STAPLER SKIN PROX 35W (STAPLE) ×5 IMPLANT
SUT VIC AB 0 CT1 27 (SUTURE) ×4
SUT VIC AB 0 CT1 27XCR 8 STRN (SUTURE) ×6 IMPLANT
SUT VIC AB 0 CT1 36 (SUTURE) ×10 IMPLANT
SUT VIC AB 2-0 CT1 (SUTURE) ×5 IMPLANT
SUT VIC AB 2-0 SH 27 (SUTURE) ×10
SUT VIC AB 2-0 SH 27XBRD (SUTURE) ×15 IMPLANT
SUT VICRYL PLUS ABS 0 54 (SUTURE) ×5 IMPLANT
SYR 10ML LL (SYRINGE) ×5 IMPLANT
SYR BULB IRRIG 60ML STRL (SYRINGE) ×5 IMPLANT
TOWEL OR 17X26 4PK STRL BLUE (TOWEL DISPOSABLE) ×5 IMPLANT
TRAY FOLEY W/METER SILVER 16FR (SET/KITS/TRAYS/PACK) ×5 IMPLANT
TRAY PREP VAG/GEN (MISCELLANEOUS) ×5 IMPLANT
TUBING CONNECTING 10 (TUBING) ×4 IMPLANT
TUBING CONNECTING 10' (TUBING) ×1
WATER STERILE IRR 1000ML POUR (IV SOLUTION) ×5 IMPLANT

## 2017-03-30 NOTE — Anesthesia Procedure Notes (Signed)
Procedure Name: Intubation Date/Time: 03/30/2017 10:11 AM Performed by: Doreen Salvage, CRNA Pre-anesthesia Checklist: Patient identified, Patient being monitored, Timeout performed, Emergency Drugs available and Suction available Patient Re-evaluated:Patient Re-evaluated prior to induction Oxygen Delivery Method: Circle system utilized Preoxygenation: Pre-oxygenation with 100% oxygen Induction Type: IV induction Ventilation: Mask ventilation without difficulty Laryngoscope Size: Mac and 3 Grade View: Grade I Tube type: Oral Tube size: 7.0 mm Number of attempts: 1 Airway Equipment and Method: Stylet Placement Confirmation: ETT inserted through vocal cords under direct vision,  positive ETCO2 and breath sounds checked- equal and bilateral Secured at: 21 cm Tube secured with: Tape Dental Injury: Teeth and Oropharynx as per pre-operative assessment

## 2017-03-30 NOTE — Progress Notes (Signed)
Report Called to Orie Rout  RN  in mother baby

## 2017-03-30 NOTE — Progress Notes (Signed)
Anemia  HCT at 25.6 post 1 unit blood yesterday  Will transfuse another unit while operating  Bmp improving , nl K NPO   ready for surgery

## 2017-03-30 NOTE — Anesthesia Postprocedure Evaluation (Signed)
Anesthesia Post Note  Patient: Rion Schnitzer Scheffel  Procedure(s) Performed: LAPAROTOMY (N/A ) SALPINGO OOPHORECTOMY (Bilateral ) DILATATION AND CURETTAGE (N/A ) APPENDECTOMY  Patient location during evaluation: PACU Anesthesia Type: General Level of consciousness: awake and alert and oriented Pain management: pain level controlled Vital Signs Assessment: post-procedure vital signs reviewed and stable Respiratory status: spontaneous breathing, nonlabored ventilation and respiratory function stable Cardiovascular status: blood pressure returned to baseline and stable Postop Assessment: no signs of nausea or vomiting Anesthetic complications: no     Last Vitals:  Vitals:   03/30/17 1541 03/30/17 1553  BP: 139/71   Pulse: 73   Resp: 19 15  Temp: 36.8 C   SpO2: 100% 97%    Last Pain:  Vitals:   03/30/17 1553  TempSrc:   PainSc: 10-Worst pain ever                 Gearold Wainer

## 2017-03-30 NOTE — Transfer of Care (Signed)
Immediate Anesthesia Transfer of Care Note  Patient: Selena Bennett  Procedure(s) Performed: Procedure(s): LAPAROTOMY (N/A) SALPINGO OOPHORECTOMY (Bilateral) DILATATION AND CURETTAGE (N/A) APPENDECTOMY  Patient Location: PACU  Anesthesia Type:General  Level of Consciousness: sedated  Airway & Oxygen Therapy: Patient Spontanous Breathing and Patient connected to face mask oxygen  Post-op Assessment: Report given to RN and Post -op Vital signs reviewed and stable  Post vital signs: Reviewed and stable  Last Vitals:  Vitals:   03/30/17 0916 03/30/17 1330  BP: (!) 143/75 (!) 115/58  Pulse: 88 75  Resp: 17 20  Temp: 36.8 C (!) 36.1 C  SpO2: 68% 11%    Complications: No apparent anesthesia complications

## 2017-03-30 NOTE — Progress Notes (Signed)
[name], female   DOB: [birth year], 52 y.o.   MRN: [id number]  [employer] PROGRESS NOTE  Selena Bennett QMV:[id number] DOB: 09-Jun-1965 DOA: 03/26/2017 PCP: Patient, No Pcp Per  HPI/Subjective: No further abdominal pain or diarrhea.. Objective: Vitals:   03/30/17 0859 03/30/17 0916  BP:  (!) 143/75  Pulse:  88  Resp:  17  Temp: 99.9 F (37.7 C) 98.3 F (36.8 C)  SpO2:  97%    Filed Weights   03/29/17 0507 03/30/17 0604 03/30/17 0916  Weight: 107 kg (235 lb 14.3 oz) 108 kg (238 lb 1.6 oz) 108 kg (238 lb 1.6 oz)    ROS: Review of Systems  Constitutional: Negative for chills and fever.  Eyes: Negative for blurred vision.  Respiratory: Negative for cough and shortness of breath.   Cardiovascular: Negative for chest pain.  Gastrointestinal: Negative for abdominal pain, constipation, diarrhea, nausea and vomiting.  Genitourinary: Negative for dysuria.  Musculoskeletal: Negative for joint pain.  Neurological: Negative for dizziness and headaches.   Exam: Physical Exam  Constitutional: She is oriented to person, place, and time.  HENT:  Nose: No mucosal edema.  Mouth/Throat: No oropharyngeal exudate or posterior oropharyngeal edema.  Eyes: Conjunctivae, EOM and lids are normal. Pupils are equal, round, and reactive to light.  Neck: No JVD present. Carotid bruit is not present. No edema present. No thyroid mass and no thyromegaly present.  Cardiovascular: S1 normal and S2 normal. Exam reveals no gallop.  No murmur heard. Pulses:      Dorsalis pedis pulses are 2+ on the right side, and 2+ on the left side.  Respiratory: No respiratory distress. She has no wheezes. She has no rhonchi. She has no rales.  GI: Soft. Bowel sounds are normal. There is no tenderness.  Musculoskeletal:       Right ankle: She exhibits no swelling.       Left ankle: She exhibits no swelling.  Lymphadenopathy:    She has no cervical adenopathy.  Neurological: She is alert and  oriented to person, place, and time. No cranial nerve deficit.  Skin: Skin is warm. No rash noted. Nails show no clubbing.  Psychiatric: She has a normal mood and affect.      Data Reviewed: Basic Metabolic Panel: Recent Labs  Lab 03/26/17 2327 03/27/17 0018 03/27/17 2841 03/27/17 1611 03/28/17 0443 03/28/17 2319 03/29/17 0554 03/30/17 0447  NA 131*  --  135  --  136  --  138  139 138  K 7.3* 6.3* 6.6* 5.2* 4.6  --  4.3  4.3 4.4  CL 106  --  114*  --  112*  --  115*  115* 113*  CO2 16*  --  13*  --  16*  --  15*  16* 18*  GLUCOSE 318*  --  241*  --  189*  --  156*  156* 161*  BUN 39*  --  37*  --  30*  --  21*  21* 16  CREATININE 2.37*  --  1.98*  --  1.86*  --  1.68*  1.72* 1.43*  CALCIUM 7.8*  --  7.3*  --  7.1*  --  7.0*  7.0* 7.4*  MG  --  0.9*  --   --  1.3* 1.8 1.7 1.8  PHOS  --   --   --   --   --   --  1.6*  1.6* 2.5   Liver Function Tests: Recent Labs  Lab  03/26/17 2327 03/29/17 0554  AST 29  --   ALT 13*  --   ALKPHOS 83  --   BILITOT 1.1  --   PROT 8.2*  --   ALBUMIN 3.6 2.6*   Recent Labs  Lab 03/26/17 2327  LIPASE 34   CBC: Recent Labs  Lab 03/26/17 2327 03/29/17 0554 03/30/17 0447  WBC 15.0* 8.0 5.2  NEUTROABS 13.7* 6.6*  --   HGB 9.2* 8.3* 8.6*  HCT 28.4* 25.2* 25.5*  MCV 92.2 92.0 91.0  PLT 140* 126* 123*   Cardiac Enzymes: Recent Labs  Lab 03/27/17 0206  TROPONINI <0.03    CBG: Recent Labs  Lab 03/29/17 0740 03/29/17 1158 03/29/17 1716 03/29/17 2108 03/30/17 0757  GLUCAP 164* 208* 162* 146* 139*    Recent Results (from the past 240 hour(s))  Blood Culture (routine x 2)     Status: None (Preliminary result)   Collection Time: 03/27/17  1:06 AM  Result Value Ref Range Status   Specimen Description BLOOD RIGHT FA  Final   Special Requests   Final    BOTTLES DRAWN AEROBIC AND ANAEROBIC Blood Culture results may not be optimal due to an excessive volume of blood received in culture bottles   Culture   Final     NO GROWTH 3 DAYS Performed at [facility], [address] [place], [place] [address]    Report Status PENDING  Incomplete  Blood Culture (routine x 2)     Status: None (Preliminary result)   Collection Time: 03/27/17  2:06 AM  Result Value Ref Range Status   Specimen Description BLOOD LEFT FA  Final   Special Requests   Final    BOTTLES DRAWN AEROBIC AND ANAEROBIC Blood Culture adequate volume   Culture   Final    NO GROWTH 3 DAYS Performed at [facility], [address] [place], [place] [address]    Report Status PENDING  Incomplete  Urine culture     Status: None   Collection Time: 03/27/17  2:22 AM  Result Value Ref Range Status   Specimen Description   Final    URINE, RANDOM Performed at [facility], [address] [place], [place] [address]    Special Requests   Final    NONE Performed at [facility], [address] [place], [place] [address]    Culture   Final    NO GROWTH Performed at [facility], [place] [address], [place],  [address]    Report Status 03/28/2017 FINAL  Final     Studies: No results found.  Scheduled Meds: . [MAR Hold] bupivacaine liposome  20 mL Infiltration Once  . [MAR Hold] calcium carbonate  400 mg of elemental calcium Oral BID  . [MAR Hold] insulin aspart  0-5 Units Subcutaneous QHS  . [MAR Hold] insulin aspart  0-9 Units Subcutaneous TID WC  . [MAR Hold] insulin glargine  13 Units Subcutaneous Daily  . [MAR Hold] magnesium oxide  400 mg Oral BID  . [MAR Hold] metoprolol tartrate  50 mg Oral BID  . [MAR Hold] pantoprazole  40 mg Oral Daily   Continuous Infusions: . sodium chloride 50 mL/hr at 03/29/17 1829  . sodium chloride    . [MAR Hold] cefTRIAXone (ROCEPHIN)  IV Stopped (03/29/17 1939)  . [MAR Hold] metronidazole Stopped (03/30/17 0710)    Assessment/Plan:   1. Clinical sepsis present on admission.  Likely gastroenteritis: Clinically better, de-escalate antibiotics  after GYN  surgery.   2. Acute kidney injury.  Creatinine improved from 1.98-1.68.  Could have some underlying chronic kidney disease.  Received IV fluids. 3. Hyperkalemia on presentation.  Improved.  Potassium down to 4.3. 4. Essential hypertension on metoprolol 5. Type 2 diabetes mellitus; controlled. 6. Right lower quadrant abdominal pain secondary to ovarian mass consistent with teratoma.  Patient to have a exploratory laparoscopy and removal of ovarian mass day by OB/GYN. Abdominal pain, diarrhea resolving.  Further management depends on postop recovery from teratoma.  Code Status:     Code Status Orders  (From admission, onward)        Start     Ordered   03/27/17 0547  Full code  Continuous     03/27/17 0546    Code Status History    Date Active Date Inactive Code Status Order ID Comments User Context   11/11/2014 23:46 11/16/2014 20:47 Full Code [id number]  Fritzi Mandes, MD Inpatient   04/21/2013 03:10 04/24/2013 00:03 Full Code [id number]  Rise Patience, MD Inpatient   01/28/2013 11:25 02/05/2013 20:38 Full Code [id number]  Donita Brooks, NP Inpatient      Disposition Plan: Potentially home on Saturday if everything goes well with surgery tomorrow.  Consultants:  Gynecology  Antibiotics:  Rocephin  And Flagyl  Time spent: 26 minutes  [place]

## 2017-03-30 NOTE — Brief Op Note (Signed)
03/26/2017 - 03/30/2017  1:18 PM  PATIENT:  Selena Bennett  51 y.o. female  PRE-OPERATIVE DIAGNOSIS:  Ovarian Mass( right )  Postmenopausal bleeding  POST-OPERATIVE DIAGNOSIS:  Right ovarian teratoma with abscess Reactive appendiceal inflammation , postmenopausal bleeding  PROCEDURE:  Procedure(s): LAPAROTOMY (N/A) SALPINGO OOPHORECTOMY (Bilateral) DILATATION AND CURETTAGE (N/A) APPENDECTOMY  SURGEON:  Surgeon(s) and Role: Panel 1:    * Schermerhorn, Gwen Her, MD - Primary    * Olean Ree, MD -gen surgery consulttant    * Benjaman Kindler, MD -  First assist Assisting Marguerite Olea , [place] student  Panel 2:    * Olean Ree, MD - Primary       ANESTHESIA:   general  EBL:  100 mL   BLOOD ADMINISTERED:none  DRAINS: Urinary Catheter (Foley)   LOCAL MEDICATIONS USED:  MARCAINE   , BUPIVICAINE  and Amount: 100 ml  SPECIMEN:  Source of Specimen:  right and left tubes and ovaries, appendix  DISPOSITION OF SPECIMEN:  PATHOLOGY  COUNTS:  YES  TOURNIQUET:  * No tourniquets in log *  DICTATION: .Other Dictation: Dictation Number verbal  PLAN OF CARE: Admit to inpatient   PATIENT DISPOSITION:  PACU - hemodynamically stable.   Delay start of Pharmacological VTE agent (>24hrs) due to surgical blood loss or risk of bleeding: yes

## 2017-03-30 NOTE — Anesthesia Preprocedure Evaluation (Signed)
Anesthesia Evaluation  Patient identified by MRN, date of birth, ID band Patient awake    Reviewed: Allergy & Precautions, NPO status , Patient's Chart, lab work & pertinent test results  History of Anesthesia Complications Negative for: history of anesthetic complications  Airway Mallampati: III  TM Distance: >3 FB Neck ROM: Full    Dental no notable dental hx.    Pulmonary neg pulmonary ROS, neg sleep apnea, neg COPD,    breath sounds clear to auscultation- rhonchi (-) wheezing      Cardiovascular hypertension, Pt. on medications (-) CAD, (-) Past MI, (-) Cardiac Stents and (-) CABG  Rhythm:Regular Rate:Normal - Systolic murmurs and - Diastolic murmurs    Neuro/Psych negative neurological ROS  negative psych ROS   GI/Hepatic GERD  ,Fatty liver   Endo/Other  diabetes, Oral Hypoglycemic Agents  Renal/GU ARFRenal disease     Musculoskeletal negative musculoskeletal ROS (+)   Abdominal (+) + obese,   Peds  Hematology  (+) anemia ,   Anesthesia Other Findings Past Medical History: No date: Anemia     Comment:  [facility] 02-04-13 acute blood loss No date: Cirrhosis ([place]) No date: Diabetes ([place]) 01/30/2013: Esophageal varices with hemorrhage ([facility]) No date: Fatty liver No date: GERD (gastroesophageal reflux disease) No date: Hypertension   Reproductive/Obstetrics                             Anesthesia Physical Anesthesia Plan  ASA: III  Anesthesia Plan: General   Post-op Pain Management:    Induction: Intravenous  PONV Risk Score and Plan: 2 and Ondansetron, Dexamethasone and Midazolam  Airway Management Planned: Oral ETT  Additional Equipment:   Intra-op Plan:   Post-operative Plan: Extubation in OR  Informed Consent: I have reviewed the patients History and Physical, chart, labs and discussed the procedure including the risks, benefits and alternatives for the proposed  anesthesia with the patient or authorized representative who has indicated his/her understanding and acceptance.   Dental advisory given  Plan Discussed with: CRNA and Anesthesiologist  Anesthesia Plan Comments: (Has received 1 unit PRBCs with no change in Hgb, plan to transfuse additional unit in OR.)        Anesthesia Quick Evaluation

## 2017-03-30 NOTE — Op Note (Signed)
  Procedure Date:  03/30/2017  Pre-operative Diagnosis:  Right ovarian mass  Post-operative Diagnosis: Right ovarian teratoma with abscess, reactive appendiceal inflammation  Procedure:  Open appendectomy  Surgeon:  Melvyn Neth, MD  Assistants:  Dr. Ouida Sills, Dr. Leafy Ro  Anesthesia:  General endotracheal  Estimated Blood Loss:  5 ml  Specimens:  appendix  Complications:  None  Indications for Procedure:  I was called to OR 9 for evaluation of this patient's appendix.  At that time, there was concern for appendiceal inflammation as a result of a right ovarian teratoma with abscess.  Given the appendiceal inflammation, I was asked to perform an appendectomy.  Description of Procedure: The patient was already intubated, prepped and draped, with an open Pfannenstiel incision.  Retractor was in place and the cecum and appendix were visible.  The distal appendix was inflamed, likely as a result of the nearby abscess, but given the possibility of developing appendicitis, it was decided to take the appendix.  The distal appendix was grasped with Kary Kos.  The base of the appendix was dissected and it was ligated with two 0 Silk ties.  The mesentery was ligated with another 0 silk tie.  The appendix was resected between ties and the mesentery was resected distal to the tie.  There was some bleeding from the mesentery which was controlled with clamps and two more silk ties.  Then the appendiceal stump was imbricated into the cecum using a 3-0 silk purse string suture.  There was no further bleeding, and no spillage of bowel contents.  Dr. Ouida Sills and Dr. Leafy Ro continued with the rest of their procedure.   Melvyn Neth, MD

## 2017-03-30 NOTE — Progress Notes (Signed)
[name], female   DOB: [birth year], 52 y.o.   MRN: [id number] DOS exlap BSO , appendectomy  Pt feels good  Doing IS well Adequate urine output  O: 131/71 p 69  abd soft  Incision covered  A: hemodynamically stable  P: cont PCA  Sliding scale q 4 hrs  . Will give her Lantus 13 units now ( already received 4 units for glucose of 311 at 2011) Repeat CBC tonight and in am

## 2017-03-30 NOTE — Anesthesia Post-op Follow-up Note (Signed)
Anesthesia QCDR form completed.        

## 2017-03-30 NOTE — Progress Notes (Signed)
MEDICATION RELATED CONSULT NOTE - INITIAL   Pharmacy Consult for electrolyte management prior to surgery on 3/1 Indication: hypomagnesemia  No Known Allergies  Patient Measurements: Height: 5\' 4"  (162.6 cm) Weight: 238 lb 1.6 oz (108 kg) IBW/kg (Calculated) : 54.7   Sodium (mmol/L)  Date Value  03/30/2017 138  01/18/2014 136   Potassium (mmol/L)  Date Value  03/30/2017 4.4  01/18/2014 3.0 (L)   Magnesium (mg/dL)  Date Value  03/30/2017 1.8   Phosphorus (mg/dL)  Date Value  03/30/2017 2.5   Calcium (mg/dL)  Date Value  03/30/2017 7.4 (L)   Calcium, Total (mg/dL)  Date Value  01/18/2014 8.4 (L)   Albumin (g/dL)  Date Value  03/29/2017 2.6 (L)  01/18/2014 2.9 (L)  ]  Estimated Creatinine Clearance: 55.8 mL/min (A) (by C-G formula based on SCr of 1.43 mg/dL (H)).    Assessment: 52 yof cc lower abdominal pain presented with diarrhea now resolved. Electrolyte abnormalities noted. Pharmacy consulted to manage electrolytes prior to surgery on 3/1.  Goal of Therapy:  Electrolytes WNL.  Plan:  Electrolytes WNL this morning - no supplementation needed prior to surgery.  Ordered home dose of mag-ox 400 mg PO BID to resume tonight.  Continue calcium carbonate 400 mg elemental calcium PO BID.   Will recheck electrolytes with AM labs tomorrow to ensure they remain WNL.  Lenis Noon, Pharm.D., BCPS Clinical Pharmacist 03/30/2017,1:21 PM

## 2017-03-31 ENCOUNTER — Encounter: Payer: Self-pay | Admitting: Obstetrics and Gynecology

## 2017-03-31 LAB — TYPE AND SCREEN
ABO/RH(D): A POS
Antibody Screen: NEGATIVE
Unit division: 0
Unit division: 0

## 2017-03-31 LAB — BPAM RBC
Blood Product Expiration Date: 201903202359
Blood Product Expiration Date: 201903212359
ISSUE DATE / TIME: 201902281518
ISSUE DATE / TIME: 201903010944
Unit Type and Rh: 6200
Unit Type and Rh: 6200

## 2017-03-31 LAB — MAGNESIUM: Magnesium: 1.7 mg/dL (ref 1.7–2.4)

## 2017-03-31 LAB — CBC
HEMATOCRIT: 28.1 % — AB (ref 35.0–47.0)
Hemoglobin: 9.3 g/dL — ABNORMAL LOW (ref 12.0–16.0)
MCH: 29.8 pg (ref 26.0–34.0)
MCHC: 33 g/dL (ref 32.0–36.0)
MCV: 90.3 fL (ref 80.0–100.0)
PLATELETS: 134 10*3/uL — AB (ref 150–440)
RBC: 3.11 MIL/uL — AB (ref 3.80–5.20)
RDW: 14.5 % (ref 11.5–14.5)
WBC: 8.2 10*3/uL (ref 3.6–11.0)

## 2017-03-31 LAB — COMPREHENSIVE METABOLIC PANEL
ALBUMIN: 2.4 g/dL — AB (ref 3.5–5.0)
ALT: 8 U/L — AB (ref 14–54)
AST: 17 U/L (ref 15–41)
Alkaline Phosphatase: 54 U/L (ref 38–126)
Anion gap: 8 (ref 5–15)
BUN: 26 mg/dL — AB (ref 6–20)
CHLORIDE: 110 mmol/L (ref 101–111)
CO2: 17 mmol/L — ABNORMAL LOW (ref 22–32)
CREATININE: 2.15 mg/dL — AB (ref 0.44–1.00)
Calcium: 7.5 mg/dL — ABNORMAL LOW (ref 8.9–10.3)
GFR calc Af Amer: 29 mL/min — ABNORMAL LOW (ref >60)
GFR calc non Af Amer: 25 mL/min — ABNORMAL LOW (ref >60)
GLUCOSE: 277 mg/dL — AB (ref 65–99)
POTASSIUM: 4.5 mmol/L (ref 3.5–5.1)
Sodium: 135 mmol/L (ref 135–145)
Total Bilirubin: 0.4 mg/dL (ref 0.3–1.2)
Total Protein: 6.2 g/dL — ABNORMAL LOW (ref 6.5–8.1)

## 2017-03-31 LAB — GLUCOSE, CAPILLARY
Glucose-Capillary: 286 mg/dL — ABNORMAL HIGH (ref 65–99)
Glucose-Capillary: 237 mg/dL — ABNORMAL HIGH (ref 65–99)
Glucose-Capillary: 239 mg/dL — ABNORMAL HIGH (ref 65–99)
Glucose-Capillary: 214 mg/dL — ABNORMAL HIGH (ref 65–99)

## 2017-03-31 LAB — PHOSPHORUS: PHOSPHORUS: 3.5 mg/dL (ref 2.5–4.6)

## 2017-03-31 MED ORDER — FUROSEMIDE 10 MG/ML IJ SOLN
20.0000 mg | Freq: Once | INTRAMUSCULAR | Status: AC
  Administered 2017-03-31: 20 mg via INTRAVENOUS
  Filled 2017-03-31: qty 2

## 2017-03-31 MED ORDER — SODIUM CHLORIDE 0.9 % IV BOLUS (SEPSIS)
1000.0000 mL | Freq: Once | INTRAVENOUS | Status: AC
  Administered 2017-03-31: 1000 mL via INTRAVENOUS

## 2017-03-31 MED ORDER — OXYCODONE-ACETAMINOPHEN 5-325 MG PO TABS
1.0000 | ORAL_TABLET | ORAL | Status: DC | PRN
  Administered 2017-03-31 (×2): 1 via ORAL
  Administered 2017-04-01 (×3): 2 via ORAL
  Filled 2017-03-31: qty 2
  Filled 2017-03-31 (×2): qty 1
  Filled 2017-03-31 (×2): qty 2

## 2017-03-31 MED ORDER — LACTATED RINGERS IV BOLUS (SEPSIS)
250.0000 mL | Freq: Once | INTRAVENOUS | Status: AC
  Administered 2017-03-31 (×3): 250 mL via INTRAVENOUS

## 2017-03-31 MED ORDER — PROMETHAZINE HCL 25 MG/ML IJ SOLN
12.5000 mg | Freq: Four times a day (QID) | INTRAMUSCULAR | Status: DC | PRN
  Administered 2017-03-31: 12.5 mg via INTRAVENOUS
  Filled 2017-03-31: qty 1

## 2017-03-31 NOTE — Op Note (Signed)
NAMESHALICE, Selena Bennett                ACCOUNT NO.:  [id number]  MEDICAL RECORD NO.:  [id number]  LOCATION:  [facility]                       FACILITY:  [facility]  PHYSICIAN:  Laverta [place], MDDATE OF BIRTH:  08-May-1965  DATE OF PROCEDURE: DATE OF DISCHARGE:                              OPERATIVE REPORT   PREOPERATIVE DIAGNOSIS: 1. Acute abdominal pain, right-sided. 2. Right ovarian mass consistent with mature teratoma. 3. Postmenopausal bleeding.  POSTOPERATIVE DIAGNOSIS: 1. Right ovarian teratoma with abscess. 2. Inflamed appendix. 3. Postmenopausal bleeding.  PROCEDURE PERFORMED: 1. Exploratory laparotomy. 2. Bilateral salpingo-oophorectomy. 3. Fractional dilation and curettage. 4. Appendectomy (see Dr. Jacqulyn [place] Piscoya's op note).  SURGEON:  Laverta [place], MD  ANESTHESIA:  General endotracheal anesthesia.  FIRST ASSISTANT:  Benjaman Kindler, MD.  SECOND ASSISTANT:  PA student, Tamala Fothergill.  INTRAOPERATIVE CONSULTANT:  General Surgery, Olean Ree MD.  INDICATIONS:  A 52 year old, gravida 4, para 4 patient, admitted to the hospital 3 days previously with acute onset of right lower quadrant pain.  CT scan and ultrasound document a 9 x 9 cm complex right ovarian mass, consistent with a dermoid.  Findings: 10 cm right ovarian cyst c/w teratoma and abscess.Secondary inflammation of appendix . Ascites. Thickened omentum   DESCRIPTION OF PROCEDURE:  After adequate general endotracheal anesthesia, the patient was placed in dorsal supine position.  Legs in the [place].  The patient's abdomen, perineum, and vagina were prepped and draped in normal sterile fashion.  Time-out was performed. The patient did receive 2 g IV Ancef prior to commencement of the case. A weighted speculum was placed in the posterior vaginal vault and anterior cervix was grasped with a single-tooth tenaculum.  An endocervical curettage was performed followed by a dilation of the endocervical  canal with Hanks dilators to a #20 Hanks dilator.  Sharp curettage performed with a polypoid tissue resulting.  These specimens will be sent separately.  Then, the main specimen.  Legs were brought back down.  Single-tooth tenaculum was removed.  Foley catheter was placed.  Gloves were changed.  A Pfannenstiel incision was then made 2 fingerbreadths above the symphysis pubis.  Sharp dissection was used to identify the fascia.  Fascia was opened in the midline and opened in a transverse fashion.  The superior aspect of the fascia was grasped with Kocher clamps and the recti muscles were dissected free.  Fair amount of edematous and thick scar tissue was encountered with dissection of the fascia and the recti muscles.  Inferior aspect of the fascia was grasped with Kocher clamps and again, the pyramidalis muscle was dissected free. Entry into the peritoneal cavity was accomplished sharply.  A large Amount( 500cc) of straw-colored fluid then resulted once gaining entrance into the abdominal cavity, consistent with ascites.  Pelvic washings were performed.  The Fredia Sorrow retractor was brought up to the operative field to aid in visualization.  The right ovary was adherent to the right sidewall into the right lateral aspect of the uterus. Gentle dissection with sharp and blunt dissection was used to free the ovary.  A small hole was placed into the ovarian mass with purulent discharge resulting.  Cultures were performed.  Ultimately, the ovary was freed enough  that the right infundibulopelvic ligament could be clamped, and the ovary and fallopian tube, which was extremely edematous were removed and will be sent to pathologist for permanent section. Pedicle was ligated with 0 Vicryl suture.  Good hemostasis noted. Tissues were friable and edematous on the right.  The left ovary was identified with a cystic structure noted.  Ultimately, this was drained as the left ovary was clamped at  the infundibulopelvic ligament, and the left ovary and fallopian tube were removed again with drainage of the left ovarian cyst.  Pedicle was secured with 0 Vicryl suture.  The patient's abdomen was copiously irrigated.  Upon irrigating, there was noted to be an exudate over the appendix.  The appendix was elevated and was edematous.  Therefore, a General Surgery consultation was requested. Dr. Olean Ree scrubbed in and given the appearance of the appendix, he opted for removal of the appendix.  Please see his operation report. Some exudative discharge was also seen on the descending colon.  Good hemostasis was noted.  The patient's abdomen was copiously irrigated and Evicel was placed over the denuded right adnexal area, where the ovary and fallopian tube were removed.  The sponges were removed and the Fredia Sorrow retractor was removed and the fascia was then closed with 0 Vicryl suture in a running nonlocking fashion with good approximation of edges.  The fascial incision was then injected with a solution of 1.3% Exparel with 0.5% Marcaine and normal saline.  A 20 mL of the Exparel, 30 mL of the Marcaine, and 50 mL of saline were used to make a solution.  A 60 mL were injected in the fascial edges. Subcutaneous tissues were then irrigated and given the depth of the subcutaneous tissues, dead space was closed with a running 2-0 chromic suture.  Skin was reapproximated with Insorb absorbable staples. Incision was injected with additional 40 mL of Exparel solution and the wound VAC was placed over the incision to aid in drainage and procedure was terminated.  There were no complications.  ESTIMATED BLOOD LOSS:  100 mL.  URINE OUTPUT:  400 mL.  INTRAOPERATIVE FLUIDS:  2000 mL.  The patient was taken to recovery room in good condition.          ______________________________ Laverta [place], MD     TS/MEDQ  D:  03/30/2017  T:  03/31/2017  Job:  [id number]

## 2017-03-31 NOTE — Progress Notes (Signed)
03/31/17  Saw patient this morning to introduce myself as I performed an appendectomy yesterday during her surgery with Dr. Ouida Sills and Dr. Leafy Ro and had not met her prior to surgery.  She reports doing well, though had some nausea earlier this morning.  She appears comfortable and in no acute distress.    Discussed with patient the rationale for performing an appendectomy given the inflammation at the distal appendix, and removing the variable of appendicitis in the post-op period.  Discussed with the patient that this should not have any consequence with nutrition, digestion, or bowel function.  From surgical standpoint, may advance diet as tolerated.   Melvyn Neth, [place] 7a-7p: [place] 7p-7a: [phone]

## 2017-03-31 NOTE — Progress Notes (Signed)
Paged MD, pt continues with low urine output. Notified Dr. Ouida Sills. Would like input from hospitalist on this case due to kidney function.

## 2017-03-31 NOTE — Progress Notes (Signed)
Spoke with Dr. Vianne Bulls. Orders placed for I liter bolus 0.9 NS, and 20 mg IV lasix. If urine output does not improve, page prime doc tonight to further advise.

## 2017-03-31 NOTE — Progress Notes (Signed)
MEDICATION RELATED CONSULT NOTE - Follow Up Consult  Pharmacy Consult for electrolyte management prior to surgery on 3/1 Indication: hypomagnesemia  No Known Allergies  Patient Measurements: Height: 5\' 4"  (162.6 cm) Weight: 238 lb 1.6 oz (108 kg) IBW/kg (Calculated) : 54.7   Sodium (mmol/L)  Date Value  03/31/2017 135  01/18/2014 136   Potassium (mmol/L)  Date Value  03/31/2017 4.5  01/18/2014 3.0 (L)   Magnesium (mg/dL)  Date Value  03/31/2017 1.7   Phosphorus (mg/dL)  Date Value  03/31/2017 3.5   Calcium (mg/dL)  Date Value  03/31/2017 7.5 (L)   Calcium, Total (mg/dL)  Date Value  01/18/2014 8.4 (L)   Albumin (g/dL)  Date Value  03/31/2017 2.4 (L)  01/18/2014 2.9 (L)  ]  Estimated Creatinine Clearance: 37.1 mL/min (A) (by C-G formula based on SCr of 2.15 mg/dL (H)).    Assessment: 52 yof cc lower abdominal pain presented with diarrhea now resolved. Electrolyte abnormalities noted. Pharmacy consulted to manage electrolytes prior to surgery on 3/1.  Goal of Therapy:  Electrolytes WNL.  Plan:  Electrolytes WNL this morning - no supplementation needed.  Ordered home dose of mag-ox 400 mg PO BID to resume tonight.  Continue calcium carbonate 400 mg elemental calcium PO BID.   Will recheck electrolytes with AM labs.  Olivia Canter, [facility] Clinical Pharmacist 03/31/2017,7:20 AM

## 2017-03-31 NOTE — Progress Notes (Signed)
CBG is 214. 2 units of Insulin per sliding scale. Dose and Insulin verified by C. Clemente.Fickle RN

## 2017-03-31 NOTE — Progress Notes (Signed)
1 Day Post-Op Procedure(s) (LRB): LAPAROTOMY (N/A) SALPINGO OOPHORECTOMY (Bilateral) DILATATION AND CURETTAGE (N/A) APPENDECTOMY She required 3 250 cc IVF boluses to increase urine output   Output 375 in 11 hrs  Subjective: Patient reports nausea and vomiting.   Pain ok  + flatus  Objective: I have reviewed patient's vital signs, intake and output, medications and labs. Results for orders placed or performed during the hospital encounter of 03/26/17 (from the past 24 hour(s))  Glucose, capillary     Status: Abnormal   Collection Time: 03/30/17  1:33 PM  Result Value Ref Range   Glucose-Capillary 208 (H) 65 - 99 mg/dL  Glucose, capillary     Status: Abnormal   Collection Time: 03/30/17  3:41 PM  Result Value Ref Range   Glucose-Capillary 182 (H) 65 - 99 mg/dL  Glucose, capillary     Status: Abnormal   Collection Time: 03/30/17  8:11 PM  Result Value Ref Range   Glucose-Capillary 311 (H) 65 - 99 mg/dL  CBC     Status: Abnormal   Collection Time: 03/30/17  9:01 PM  Result Value Ref Range   WBC 5.8 3.6 - 11.0 K/uL   RBC 3.55 (L) 3.80 - 5.20 MIL/uL   Hemoglobin 10.7 (L) 12.0 - 16.0 g/dL   HCT 32.1 (L) 35.0 - 47.0 %   MCV 90.5 80.0 - 100.0 fL   MCH 30.2 26.0 - 34.0 pg   MCHC 33.4 32.0 - 36.0 g/dL   RDW 14.8 (H) 11.5 - 14.5 %   Platelets 138 (L) 150 - 440 K/uL  Glucose, capillary     Status: Abnormal   Collection Time: 03/30/17 11:33 PM  Result Value Ref Range   Glucose-Capillary 311 (H) 65 - 99 mg/dL  Glucose, capillary     Status: Abnormal   Collection Time: 03/31/17  3:47 AM  Result Value Ref Range   Glucose-Capillary 286 (H) 65 - 99 mg/dL  Magnesium     Status: None   Collection Time: 03/31/17  5:52 AM  Result Value Ref Range   Magnesium 1.7 1.7 - 2.4 mg/dL  Phosphorus     Status: None   Collection Time: 03/31/17  5:52 AM  Result Value Ref Range   Phosphorus 3.5 2.5 - 4.6 mg/dL  CBC     Status: Abnormal   Collection Time: 03/31/17  5:52 AM  Result Value Ref Range    WBC 8.2 3.6 - 11.0 K/uL   RBC 3.11 (L) 3.80 - 5.20 MIL/uL   Hemoglobin 9.3 (L) 12.0 - 16.0 g/dL   HCT 28.1 (L) 35.0 - 47.0 %   MCV 90.3 80.0 - 100.0 fL   MCH 29.8 26.0 - 34.0 pg   MCHC 33.0 32.0 - 36.0 g/dL   RDW 14.5 11.5 - 14.5 %   Platelets 134 (L) 150 - 440 K/uL  Comprehensive metabolic panel     Status: Abnormal   Collection Time: 03/31/17  5:52 AM  Result Value Ref Range   Sodium 135 135 - 145 mmol/L   Potassium 4.5 3.5 - 5.1 mmol/L   Chloride 110 101 - 111 mmol/L   CO2 17 (L) 22 - 32 mmol/L   Glucose, Bld 277 (H) 65 - 99 mg/dL   BUN 26 (H) 6 - 20 mg/dL   Creatinine, Ser 2.15 (H) 0.44 - 1.00 mg/dL   Calcium 7.5 (L) 8.9 - 10.3 mg/dL   Total Protein 6.2 (L) 6.5 - 8.1 g/dL   Albumin 2.4 (L) 3.5 - 5.0 g/dL  AST 17 15 - 41 U/L   ALT 8 (L) 14 - 54 U/L   Alkaline Phosphatase 54 38 - 126 U/L   Total Bilirubin 0.4 0.3 - 1.2 mg/dL   GFR calc non Af Amer 25 (L) >60 mL/min   GFR calc Af Amer 29 (L) >60 mL/min   Anion gap 8 5 - 15  Glucose, capillary     Status: Abnormal   Collection Time: 03/31/17  8:16 AM  Result Value Ref Range   Glucose-Capillary 237 (H) 65 - 99 mg/dL     wdwn B female in NAd   Lungs CTA  Cv RRR without murmur  abd: soft Non distended , non tender , hypoactive BS  Incision covered with wound vac  Assessment: s/p Procedure(s): LAPAROTOMY (N/A) SALPINGO OOPHORECTOMY (Bilateral) DILATATION AND CURETTAGE (N/A) APPENDECTOMY:  Increase in Creatinine  Adequate urine output. Chronic kidney disease  hemodynamically stable  Glucose adequately controlled   afebrile with abscess removed , still at risk for infection  Plan: Advance diet prn given + flatus  D/c PCA . Start po percocet  D/c toradol given decrease GFR   cont foley to monitor urine output  Sliding scale for elevated glucose and PM LAntus  Cont Rocephin and flagyl  for an additional 24 hours   Hospitalist service will cont to manage medical issues as consultants  LOS: 4 days    Selena Bennett  Renae Mottley 03/31/2017, 11:34 AM

## 2017-03-31 NOTE — Progress Notes (Signed)
[name], female   DOB: 11/18/1965, 52 y.o.   MRN: [id number]  [employer] PROGRESS NOTE  CHERIL SLATTERY [id number] DOB: Sep 28, 1965 DOA: 03/26/2017 PCP: Patient, No Pcp Per  HPI/Subjective: Laparoscopic teratoma removal, appendectomy yesterday.  Seen in GYN floor.  Transferred to GYN service.  We will follow as a consult.  Patient is on PCA pump, patient appears comfortable, however has decreased urine output, slightly high creatinine today.  Has some nausea. Objective: Vitals:   03/31/17 0924 03/31/17 1300  BP:  (!) 143/56  Pulse:  (!) 59  Resp: 15 16  Temp:  97.6 F (36.4 C)  SpO2: 100% 100%    Filed Weights   03/29/17 0507 03/30/17 0604 03/30/17 0916  Weight: 107 kg (235 lb 14.3 oz) 108 kg (238 lb 1.6 oz) 108 kg (238 lb 1.6 oz)    ROS: Review of Systems  Constitutional: Negative for chills and fever.  Eyes: Negative for blurred vision.  Respiratory: Negative for cough and shortness of breath.   Cardiovascular: Negative for chest pain.  Gastrointestinal: Negative for abdominal pain, constipation, diarrhea, nausea and vomiting.  Genitourinary: Negative for dysuria.  Musculoskeletal: Negative for joint pain.  Neurological: Negative for dizziness and headaches.   Exam: Physical Exam  Constitutional: She is oriented to person, place, and time.  HENT:  Nose: No mucosal edema.  Mouth/Throat: No oropharyngeal exudate or posterior oropharyngeal edema.  Eyes: Conjunctivae, EOM and lids are normal. Pupils are equal, round, and reactive to light.  Neck: No JVD present. Carotid bruit is not present. No edema present. No thyroid mass and no thyromegaly present.  Cardiovascular: S1 normal and S2 normal. Exam reveals no gallop.  No murmur heard. Pulses:      Dorsalis pedis pulses are 2+ on the right side, and 2+ on the left side.  Respiratory: No respiratory distress. She has no wheezes. She has no rhonchi. She has no rales.  GI: Soft. Bowel sounds are normal.  There is no tenderness.  Musculoskeletal:       Right ankle: She exhibits no swelling.       Left ankle: She exhibits no swelling.  Lymphadenopathy:    She has no cervical adenopathy.  Neurological: She is alert and oriented to person, place, and time. No cranial nerve deficit.  Skin: Skin is warm. No rash noted. Nails show no clubbing.  Psychiatric: She has a normal mood and affect.  Has small bandage present in the abdomen.    Data Reviewed: Basic Metabolic Panel: Recent Labs  Lab 03/27/17 0855 03/27/17 1611 03/28/17 0443 03/28/17 2319 03/29/17 0554 03/30/17 0447 03/31/17 0552  NA 135  --  136  --  138  139 138 135  K 6.6* 5.2* 4.6  --  4.3  4.3 4.4 4.5  CL 114*  --  112*  --  115*  115* 113* 110  CO2 13*  --  16*  --  15*  16* 18* 17*  GLUCOSE 241*  --  189*  --  156*  156* 161* 277*  BUN 37*  --  30*  --  21*  21* 16 26*  CREATININE 1.98*  --  1.86*  --  1.68*  1.72* 1.43* 2.15*  CALCIUM 7.3*  --  7.1*  --  7.0*  7.0* 7.4* 7.5*  MG  --   --  1.3* 1.8 1.7 1.8 1.7  PHOS  --   --   --   --  1.6*  1.6* 2.5  3.5   Liver Function Tests: Recent Labs  Lab 03/26/17 2327 03/29/17 0554 03/31/17 0552  AST 29  --  17  ALT 13*  --  8*  ALKPHOS 83  --  54  BILITOT 1.1  --  0.4  PROT 8.2*  --  6.2*  ALBUMIN 3.6 2.6* 2.4*   Recent Labs  Lab 03/26/17 2327  LIPASE 34   CBC: Recent Labs  Lab 03/26/17 2327 03/29/17 0554 03/30/17 0447 03/30/17 2101 03/31/17 0552  WBC 15.0* 8.0 5.2 5.8 8.2  NEUTROABS 13.7* 6.6*  --   --   --   HGB 9.2* 8.3* 8.6* 10.7* 9.3*  HCT 28.4* 25.2* 25.5* 32.1* 28.1*  MCV 92.2 92.0 91.0 90.5 90.3  PLT 140* 126* 123* 138* 134*   Cardiac Enzymes: Recent Labs  Lab 03/27/17 0206  TROPONINI <0.03    CBG: Recent Labs  Lab 03/30/17 1541 03/30/17 2011 03/30/17 2333 03/31/17 0347 03/31/17 0816  GLUCAP 182* 311* 311* 286* 237*    Recent Results (from the past 240 hour(s))  Blood Culture (routine x 2)     Status: None  (Preliminary result)   Collection Time: 03/27/17  1:06 AM  Result Value Ref Range Status   Specimen Description BLOOD RIGHT FA  Final   Special Requests   Final    BOTTLES DRAWN AEROBIC AND ANAEROBIC Blood Culture results may not be optimal due to an excessive volume of blood received in culture bottles   Culture   Final    NO GROWTH 4 DAYS Performed at [facility], [address], [place], [place] [address]    Report Status PENDING  Incomplete  Blood Culture (routine x 2)     Status: None (Preliminary result)   Collection Time: 03/27/17  2:06 AM  Result Value Ref Range Status   Specimen Description BLOOD LEFT FA  Final   Special Requests   Final    BOTTLES DRAWN AEROBIC AND ANAEROBIC Blood Culture adequate volume   Culture   Final    NO GROWTH 4 DAYS Performed at [facility], [address] [place], [place] [address]    Report Status PENDING  Incomplete  Urine culture     Status: None   Collection Time: 03/27/17  2:22 AM  Result Value Ref Range Status   Specimen Description   Final    URINE, RANDOM Performed at [facility], [address] [place], [place] [address]    Special Requests   Final    NONE Performed at [facility], [address], [place], [place] [address]    Culture   Final    NO GROWTH Performed at [facility], [place] [address], [place], [place] [address]    Report Status 03/28/2017 FINAL  Final  Aerobic/Anaerobic Culture (surgical/deep wound)     Status: None (Preliminary result)   Collection Time: 03/30/17 11:16 AM  Result Value Ref Range Status   Specimen Description   Final    ABSCESS Performed at [facility], [address], [place], [place] [address]    Special Requests   Final    RIGHT OVARY ABSCESS Performed at [facility], [address] [place], [place] [address]    Gram Stain   Final    ABUNDANT WBC PRESENT,BOTH PMN AND MONONUCLEAR NO ORGANISMS SEEN    Culture    Final    NO GROWTH < 24 HOURS Performed at [facility], [place],  [place] [address]    Report Status PENDING  Incomplete     Studies: No results found.  Scheduled Meds: . bupivacaine liposome  20 mL Infiltration Once  . calcium carbonate  400 mg of elemental calcium Oral BID  . enoxaparin (LOVENOX) injection  40 mg Subcutaneous Q24H  . insulin aspart  0-5 Units Subcutaneous QHS  . insulin aspart  0-9 Units Subcutaneous TID WC  . insulin glargine  13 Units Subcutaneous Daily  . magnesium oxide  400 mg Oral BID  . metoprolol tartrate  50 mg Oral BID  . pantoprazole  40 mg Oral Daily   Continuous Infusions: . cefTRIAXone (ROCEPHIN)  IV Stopped (03/30/17 2140)  . lactated ringers 125 mL/hr at 03/31/17 0215  . metronidazole Stopped (03/31/17 0450)    Assessment/Plan:   1. Clinical sepsis present on admission.  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  Discussed with OB/GYN.  Continue IV antibiotics for another day or so.   2. Acute kidney injury.  Slight worsening of kidney function after surgery, continue aggressive hydration for another day, monitor kidney function tomorrow, encourage p.o. fluid intake, wean off PCA pump as it can cause nausea.  And DC Toradol.  Discussed with GYN. 3. Hyperkalemia on presentation.  Improved.   4. Essential hypertension; on metoprolol, monitor heart rate and BP. 5. Type 2 diabetes mellitus; controlled.,  Advance her diet from clear liquids to ADA diet as per GYN.  Continue sliding scale with coverage, Lantus 13 units daily, I will increase it once her p.o. intake improves.  He will then continue sliding scale with coverage, present dose of Lantus.   Code Status:     Code Status Orders  (From admission, onward)        Start     Ordered   03/27/17 0547  Full code  Continuous     03/27/17 0546    Code Status History    Date Active Date Inactive Code Status Order ID Comments User Context   11/11/2014 23:46  11/16/2014 20:47 Full Code [id number]  Fritzi Mandes, MD Inpatient   04/21/2013 03:10 04/24/2013 00:03 Full Code [id number]  Rise Patience, MD Inpatient   01/28/2013 11:25 02/05/2013 20:38 Full Code [id number]  Donita Brooks, NP Inpatient      Disposition Plan: Discussed with GYN.  Further disposition as per her postop recovery.  Consultants:  Gynecology  Antibiotics:  Rocephin  And Flagyl  Time spent: 26 minutes  [place]

## 2017-04-01 LAB — CULTURE, BLOOD (ROUTINE X 2)
CULTURE: NO GROWTH
CULTURE: NO GROWTH
SPECIAL REQUESTS: ADEQUATE

## 2017-04-01 LAB — CBC
HEMATOCRIT: 31.1 % — AB (ref 35.0–47.0)
HEMOGLOBIN: 10.5 g/dL — AB (ref 12.0–16.0)
MCH: 30.4 pg (ref 26.0–34.0)
MCHC: 33.7 g/dL (ref 32.0–36.0)
MCV: 90.3 fL (ref 80.0–100.0)
Platelets: 152 10*3/uL (ref 150–440)
RBC: 3.45 MIL/uL — ABNORMAL LOW (ref 3.80–5.20)
RDW: 14.6 % — ABNORMAL HIGH (ref 11.5–14.5)
WBC: 8.1 10*3/uL (ref 3.6–11.0)

## 2017-04-01 LAB — BASIC METABOLIC PANEL
Anion gap: 9 (ref 5–15)
BUN: 29 mg/dL — AB (ref 6–20)
CHLORIDE: 109 mmol/L (ref 101–111)
CO2: 20 mmol/L — AB (ref 22–32)
Calcium: 8.1 mg/dL — ABNORMAL LOW (ref 8.9–10.3)
Creatinine, Ser: 2.1 mg/dL — ABNORMAL HIGH (ref 0.44–1.00)
GFR calc Af Amer: 30 mL/min — ABNORMAL LOW (ref >60)
GFR calc non Af Amer: 26 mL/min — ABNORMAL LOW (ref >60)
GLUCOSE: 188 mg/dL — AB (ref 65–99)
POTASSIUM: 3.9 mmol/L (ref 3.5–5.1)
Sodium: 138 mmol/L (ref 135–145)

## 2017-04-01 LAB — GLUCOSE, CAPILLARY
Glucose-Capillary: 222 mg/dL — ABNORMAL HIGH (ref 65–99)
Glucose-Capillary: 264 mg/dL — ABNORMAL HIGH (ref 65–99)

## 2017-04-01 LAB — MAGNESIUM: MAGNESIUM: 1.5 mg/dL — AB (ref 1.7–2.4)

## 2017-04-01 MED ORDER — ONDANSETRON HCL 4 MG PO TABS: 4.0000 mg | ORAL_TABLET | Freq: Four times a day (QID) | ORAL | 0 refills | Status: DC | PRN

## 2017-04-01 MED ORDER — INSULIN ASPART 100 UNIT/ML ~~LOC~~ SOLN
0.0000 [IU] | SUBCUTANEOUS | Status: DC
  Administered 2017-04-01: 3 [IU] via SUBCUTANEOUS
  Administered 2017-04-01: 2 [IU] via SUBCUTANEOUS
  Filled 2017-04-01: qty 1

## 2017-04-01 MED ORDER — AMOXICILLIN-POT CLAVULANATE 875-125 MG PO TABS: 1.0000 | ORAL_TABLET | Freq: Two times a day (BID) | ORAL | 0 refills | Status: AC

## 2017-04-01 MED ORDER — INSULIN ASPART 100 UNIT/ML ~~LOC~~ SOLN: 0.0000 [IU] | Freq: Every day | SUBCUTANEOUS | Status: DC

## 2017-04-01 MED ORDER — INSULIN ASPART 100 UNIT/ML ~~LOC~~ SOLN
0.0000 [IU] | Freq: Three times a day (TID) | SUBCUTANEOUS | Status: DC
  Administered 2017-04-01: 3 [IU] via SUBCUTANEOUS
  Filled 2017-04-01: qty 1

## 2017-04-01 MED ORDER — DOCUSATE SODIUM 100 MG PO CAPS: 100.0000 mg | ORAL_CAPSULE | Freq: Every day | ORAL | 2 refills | Status: AC | PRN

## 2017-04-01 MED ORDER — ENOXAPARIN SODIUM 40 MG/0.4ML ~~LOC~~ SOLN: 40.0000 mg | SUBCUTANEOUS | 0 refills | Status: DC

## 2017-04-01 MED ORDER — MAGNESIUM SULFATE 2 GM/50ML IV SOLN
2.0000 g | Freq: Once | INTRAVENOUS | Status: AC
Start: 2017-04-01 — End: 2017-04-01
  Administered 2017-04-01: 2 g via INTRAVENOUS
  Filled 2017-04-01: qty 50

## 2017-04-01 MED ORDER — OXYCODONE-ACETAMINOPHEN 5-325 MG PO TABS: 1.0000 | ORAL_TABLET | ORAL | 0 refills | Status: AC | PRN

## 2017-04-01 NOTE — Progress Notes (Signed)
Dr. Leafy Ro notified that Sliding Scale does not match CBG order. Order to do Sliding Scale Q4h. CBG Results for 200 and 0000 reported as well as U/O. Pt. Administered 3 units of Insulin per sliding Scale order. Insulin type and amount verified by M. Juleen China.

## 2017-04-01 NOTE — Progress Notes (Signed)
Hospitalist rounding on patient. Reviewed orders for SSI overnight coverage, discontinued current order per MD request. Will place new orders. Recommended to remove foley today and for pt to f/u with PCP in 1 week and recheck labs at that time.

## 2017-04-01 NOTE — Progress Notes (Signed)
[name], female   DOB: 1965-11-16, 52 y.o.   MRN: [id number]  [employer] PROGRESS NOTE  Selena Bennett NLZ:[id number] DOB: 1965-05-07 DOA: 03/26/2017 PCP: Patient, No Pcp Per  HPI/Subjective: Laparoscopic teratoma removal, appendectomy .Marland Kitchen  Seen in GYN floor.  Transferred to GYN service.  Her pain is controlled, sitting in the chair, tolerating the diet.  Urine output also improved since last night.  Blood sugar up to 260  objective: Vitals:   04/01/17 0402 04/01/17 0810  BP: 126/68 138/72  Pulse: 86 65  Resp: 18 18  Temp: 97.9 F (36.6 C) 98.1 F (36.7 C)  SpO2: 96% 99%    Filed Weights   03/29/17 0507 03/30/17 0604 03/30/17 0916  Weight: 107 kg (235 lb 14.3 oz) 108 kg (238 lb 1.6 oz) 108 kg (238 lb 1.6 oz)    ROS: Review of Systems  Constitutional: Negative for chills and fever.  Eyes: Negative for blurred vision.  Respiratory: Negative for cough and shortness of breath.   Cardiovascular: Negative for chest pain.  Gastrointestinal: Negative for abdominal pain, constipation, diarrhea, nausea and vomiting.  Genitourinary: Negative for dysuria.  Musculoskeletal: Negative for joint pain.  Neurological: Negative for dizziness and headaches.   Exam: Physical Exam  Constitutional: She is oriented to person, place, and time.  HENT:  Nose: No mucosal edema.  Mouth/Throat: No oropharyngeal exudate or posterior oropharyngeal edema.  Eyes: Conjunctivae, EOM and lids are normal. Pupils are equal, round, and reactive to light.  Neck: No JVD present. Carotid bruit is not present. No edema present. No thyroid mass and no thyromegaly present.  Cardiovascular: S1 normal and S2 normal. Exam reveals no gallop.  No murmur heard. Pulses:      Dorsalis pedis pulses are 2+ on the right side, and 2+ on the left side.  Respiratory: No respiratory distress. She has no wheezes. She has no rhonchi. She has no rales.  GI: Soft. Bowel sounds are normal. There is no tenderness.   Musculoskeletal:       Right ankle: She exhibits no swelling.       Left ankle: She exhibits no swelling.  Lymphadenopathy:    She has no cervical adenopathy.  Neurological: She is alert and oriented to person, place, and time. No cranial nerve deficit.  Skin: Skin is warm. No rash noted. Nails show no clubbing.  Psychiatric: She has a normal mood and affect.  Has small bandage present in the abdomen.    Data Reviewed: Basic Metabolic Panel: Recent Labs  Lab 03/28/17 0443 03/28/17 2319 03/29/17 0554 03/30/17 0447 03/31/17 0552 04/01/17 0737  NA 136  --  138  139 138 135 138  K 4.6  --  4.3  4.3 4.4 4.5 3.9  CL 112*  --  115*  115* 113* 110 109  CO2 16*  --  15*  16* 18* 17* 20*  GLUCOSE 189*  --  156*  156* 161* 277* 188*  BUN 30*  --  21*  21* 16 26* 29*  CREATININE 1.86*  --  1.68*  1.72* 1.43* 2.15* 2.10*  CALCIUM 7.1*  --  7.0*  7.0* 7.4* 7.5* 8.1*  MG 1.3* 1.8 1.7 1.8 1.7 1.5*  PHOS  --   --  1.6*  1.6* 2.5 3.5  --    Liver Function Tests: Recent Labs  Lab 03/26/17 2327 03/29/17 0554 03/31/17 0552  AST 29  --  17  ALT 13*  --  8*  ALKPHOS 83  --  45  BILITOT 1.1  --  0.4  PROT 8.2*  --  6.2*  ALBUMIN 3.6 2.6* 2.4*   Recent Labs  Lab 03/26/17 2327  LIPASE 34   CBC: Recent Labs  Lab 03/26/17 2327 03/29/17 0554 03/30/17 0447 03/30/17 2101 03/31/17 0552 04/01/17 0737  WBC 15.0* 8.0 5.2 5.8 8.2 8.1  NEUTROABS 13.7* 6.6*  --   --   --   --   HGB 9.2* 8.3* 8.6* 10.7* 9.3* 10.5*  HCT 28.4* 25.2* 25.5* 32.1* 28.1* 31.1*  MCV 92.2 92.0 91.0 90.5 90.3 90.3  PLT 140* 126* 123* 138* 134* 152   Cardiac Enzymes: Recent Labs  Lab 03/27/17 0206  TROPONINI <0.03    CBG: Recent Labs  Lab 03/31/17 0816 03/31/17 1659 03/31/17 2019 04/01/17 0007 04/01/17 0402  GLUCAP 237* 239* 214* 264* 222*    Recent Results (from the past 240 hour(s))  Blood Culture (routine x 2)     Status: None   Collection Time: 03/27/17  1:06 AM  Result Value Ref  Range Status   Specimen Description BLOOD RIGHT FA  Final   Special Requests   Final    BOTTLES DRAWN AEROBIC AND ANAEROBIC Blood Culture results may not be optimal due to an excessive volume of blood received in culture bottles   Culture   Final    NO GROWTH 5 DAYS Performed at [facility], [address], [place], [place] [address]    Report Status 04/01/2017 FINAL  Final  Blood Culture (routine x 2)     Status: None   Collection Time: 03/27/17  2:06 AM  Result Value Ref Range Status   Specimen Description BLOOD LEFT FA  Final   Special Requests   Final    BOTTLES DRAWN AEROBIC AND ANAEROBIC Blood Culture adequate volume   Culture   Final    NO GROWTH 5 DAYS Performed at [facility], [address], [place], [place] [address]    Report Status 04/01/2017 FINAL  Final  Urine culture     Status: None   Collection Time: 03/27/17  2:22 AM  Result Value Ref Range Status   Specimen Description   Final    URINE, RANDOM Performed at [facility], [address], [place], [place] [address]    Special Requests   Final    NONE Performed at [facility], [address], [place], [place] [address]    Culture   Final    NO GROWTH Performed at [facility], [place] [address], [place], [place] [address]    Report Status 03/28/2017 FINAL  Final  Aerobic/Anaerobic Culture (surgical/deep wound)     Status: None (Preliminary result)   Collection Time: 03/30/17 11:16 AM  Result Value Ref Range Status   Specimen Description   Final    ABSCESS Performed at [facility], [address], [place], [place] [address]    Special Requests   Final    RIGHT OVARY ABSCESS Performed at [facility], [place] [place], [place] [address]    Gram Stain   Final    ABUNDANT WBC PRESENT,BOTH PMN AND MONONUCLEAR NO ORGANISMS SEEN    Culture   Final    NO GROWTH < 24 HOURS Performed at [facility], [place] [address],  [place], [place] [address]    Report Status PENDING  Incomplete     Studies: No results found.  Scheduled Meds: . bupivacaine liposome  20 mL Infiltration  Once  . calcium carbonate  400 mg of elemental calcium Oral BID  . enoxaparin (LOVENOX) injection  40 mg Subcutaneous Q24H  . insulin aspart  0-15 Units Subcutaneous TID WC  . insulin aspart  0-5 Units Subcutaneous QHS  . insulin glargine  13 Units Subcutaneous Daily  . magnesium oxide  400 mg Oral BID  . metoprolol tartrate  50 mg Oral BID  . pantoprazole  40 mg Oral Daily   Continuous Infusions: . cefTRIAXone (ROCEPHIN)  IV 2 g (03/31/17 1724)  . metronidazole Stopped (04/01/17 0459)    Assessment/Plan:   1. Clinical sepsis present on admission.  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  Discussed with OB/GYN..  Patient on IV antibiotics.  No further fever.  Tolerating the diet also. 2. Acute kidney injury.  On chronic kidney disease stage II: kidney  function is expected to improve because her urine output also improved, p.o. intake also improved.  If patient is discharged by GYN patient can follow-up with her PCP Dr. Clide Deutscher in 1 week and have repeat labs for BMP and follow-up on kidney function.  Hyperkalemia on presentation.  Improved.  Possible postop kidney injury and also dehydration.  Patient is feeling much better.  I told patient that she can follow-up as an outpatient with Dr. Clide Deutscher regarding kidney function check ,she can be discharged from medical standpoint. 3. Essential hypertension; on metoprolol,  Type 2 diabetes mellitus; controlled.,  Hyper glycemia this morning.  Adjusted the Lantus, also increase the sliding scale 2.  Sliding scale coverage, Discussed with nurse. Code Status:     Code Status Orders  (From admission, onward)        Start     Ordered   03/27/17 0547  Full code  Continuous     03/27/17 0546    Code Status History    Date Active Date Inactive Code Status Order ID Comments User Context    11/11/2014 23:46 11/16/2014 20:47 Full Code [id number]  Fritzi Mandes, MD Inpatient   04/21/2013 03:10 04/24/2013 00:03 Full Code [id number]  Rise Patience, MD Inpatient   01/28/2013 11:25 02/05/2013 20:38 Full Code [id number]  Donita Brooks, NP Inpatient      Disposition Plan: Discussed with GYN.  Further disposition as per her postop recovery.  Consultants:  Gynecology  Antibiotics:  Rocephin  And Flagyl  Time spent: 26 minutes  [place]

## 2017-04-01 NOTE — Progress Notes (Signed)
Pt discharged to home with family. D/C instructions reviewed with pt who verbalized understanding. Prescriptions sent to pharmacy by MD, pt notified. Follow up appointments not scheduled due to after hours, pt given instructions. Pt d/c'd via W/C.

## 2017-04-01 NOTE — Discharge Summary (Signed)
Physician Discharge Summary  [name] MRN: [id number] DOB/AGE: 08/21/1965 52 y.o.  Admit date: 03/26/2017 Discharge date: 04/01/2017  Admission Diagnoses:acute onset right pelvic pain ,9x9 cm ovarian ( right cyst)  Discharge Diagnoses: Right ovarian dermoid with abscess, intraabdominal exudative process involving appendix  secondary to ovarian abscess Active Problems:   AKI (acute kidney injury) ([place])   Postoperative state   RLQ abdominal pain type 2 DM  HTN  Fatty liver, possible cirrhosis with portal hypertension  Anemia ( preop)  Splenic enlargement  Discharged Condition: stable  Hospital Course: pt was admitted to [facility] 03/27/17 and medical issues( kidney and glucose  HTN and hyperkalemia were stabilized ) Started on Rocephin and Flagyl for sepsis  On HD #2 pt received 1 unit of packed RBC for HCT 25% in preparation for surgery . HD#3 she underwent exlap and BSO and removal of 10 cm right ovarian dermoid and abscess within . Secondary infection of appendix and gen surgery removed appendix at the same surgery . Postop pt had marginal urine output that required Fluid challenges and on 03/31/17 required 1 dose of LAsix with good response. She did receive 1 additional unit of blood intraoperatively and HCT remained stable . She remained afebrile and and had a normal wbc post op  .  Initiial K+= 7.3  And stabilized with IVF . Nephrology saw her as a consulatant .  Cr ranged from 2.3 on admission to 1.4 and back to 2.1 on discharge  Consults: nephrology  Significant Diagnostic Studies: labs:  and radiology:  Recent Results (from the past 2160 hour(s))  Lipase, blood     Status: None   Collection Time: 03/26/17 11:27 PM  Result Value Ref Range   Lipase 34 11 - 51 U/L    Comment: Performed at [facility], [address] [place], [place] [address]  Comprehensive metabolic panel     Status: Abnormal   Collection Time: 03/26/17 11:27 PM  Result Value Ref Range    Sodium 131 (L) 135 - 145 mmol/L   Potassium 7.3 (HH) 3.5 - 5.1 mmol/L    Comment: CRITICAL RESULT CALLED TO, READ BACK BY AND VERIFIED WITH BUTCH WOODS RN AT 0005 03/27/17.MSS    Chloride 106 101 - 111 mmol/L   CO2 16 (L) 22 - 32 mmol/L   Glucose, Bld 318 (H) 65 - 99 mg/dL   BUN 39 (H) 6 - 20 mg/dL   Creatinine, Ser 2.37 (H) 0.44 - 1.00 mg/dL   Calcium 7.8 (L) 8.9 - 10.3 mg/dL   Total Protein 8.2 (H) 6.5 - 8.1 g/dL   Albumin 3.6 3.5 - 5.0 g/dL   AST 29 15 - 41 U/L   ALT 13 (L) 14 - 54 U/L   Alkaline Phosphatase 83 38 - 126 U/L   Total Bilirubin 1.1 0.3 - 1.2 mg/dL   GFR calc non Af Amer 23 (L) >60 mL/min   GFR calc Af Amer 26 (L) >60 mL/min    Comment: (NOTE) The eGFR has been calculated using the CKD EPI equation. This calculation has not been validated in all clinical situations. eGFR's persistently <60 mL/min signify possible Chronic Kidney Disease.    Anion gap 9 5 - 15    Comment: Performed at [facility], [place], [place] [address]  Lactic acid, plasma     Status: None   Collection Time: 03/26/17 11:27 PM  Result Value Ref Range   Lactic Acid, Venous 1.6 0.5 - 1.9  mmol/L    Comment: Performed at [facility], [address] [place], [place] [address]  CBC with Differential/Platelet     Status: Abnormal   Collection Time: 03/26/17 11:27 PM  Result Value Ref Range   WBC 15.0 (H) 3.6 - 11.0 K/uL   RBC 3.08 (L) 3.80 - 5.20 MIL/uL   Hemoglobin 9.2 (L) 12.0 - 16.0 g/dL   HCT 28.4 (L) 35.0 - 47.0 %   MCV 92.2 80.0 - 100.0 fL   MCH 29.9 26.0 - 34.0 pg   MCHC 32.4 32.0 - 36.0 g/dL   RDW 13.8 11.5 - 14.5 %   Platelets 140 (L) 150 - 440 K/uL   Neutrophils Relative % 92 %   Neutro Abs 13.7 (H) 1.4 - 6.5 K/uL   Lymphocytes Relative 5 %   Lymphs Abs 0.8 (L) 1.0 - 3.6 K/uL   Monocytes Relative 3 %   Monocytes Absolute 0.5 0.2 - 0.9 K/uL   Eosinophils Relative 0 %   Eosinophils Absolute 0.0 0 - 0.7 K/uL   Basophils Relative 0 %    Basophils Absolute 0.0 0 - 0.1 K/uL    Comment: Performed at [facility], [address] [place], [place] [address]  hCG, quantitative, pregnancy     Status: None   Collection Time: 03/26/17 11:27 PM  Result Value Ref Range   hCG, Beta Chain, Quant, S 1 <5 mIU/mL    Comment:          GEST. AGE      CONC.  (mIU/mL)   <=1 WEEK        5 - 50     2 WEEKS       50 - 500     3 WEEKS       100 - 10,000     4 WEEKS     1,000 - 30,000     5 WEEKS     3,500 - 115,000   6-8 WEEKS     12,000 - 270,000    12 WEEKS     15,000 - 220,000        FEMALE AND NON-PREGNANT FEMALE:     LESS THAN 5 mIU/mL Performed at [facility], [address] [place], [place] [address]   Potassium     Status: Abnormal   Collection Time: 03/27/17 12:18 AM  Result Value Ref Range   Potassium 6.3 (HH) 3.5 - 5.1 mmol/L    Comment: CRITICAL RESULT CALLED TO, READ BACK BY AND VERIFIED WITH BUTCH WOODS RN AT [phone] 03/27/17.MSS Performed at [facility], [address] [place], [place] [address]   Magnesium     Status: Abnormal   Collection Time: 03/27/17 12:18 AM  Result Value Ref Range   Magnesium 0.9 (LL) 1.7 - 2.4 mg/dL    Comment: CRITICAL RESULT CALLED TO, READ BACK BY AND VERIFIED WITH BUTCH WOODS RN AT [phone] 03/27/17. MSS Performed at [facility], [address] [place],  [address]   Blood Culture (routine x 2)     Status: None   Collection Time: 03/27/17  1:06 AM  Result Value Ref Range   Specimen Description BLOOD RIGHT FA    Special Requests      BOTTLES DRAWN AEROBIC AND ANAEROBIC Blood Culture results may not be optimal due to an excessive volume of blood received in culture bottles   Culture      NO GROWTH 5 DAYS Performed at [facility], [place]  [address] [place], [place] [address]    Report Status 04/01/2017 FINAL   Troponin I     Status: None   Collection Time: 03/27/17  2:06 AM  Result Value Ref Range   Troponin I <0.03 <0.03 ng/mL     Comment: Performed at [facility], [place] [address], [place], [place] [address]  Procalcitonin     Status: None   Collection Time: 03/27/17  2:06 AM  Result Value Ref Range   Procalcitonin 4.50 ng/mL    Comment:        Interpretation: PCT > 2 ng/mL: Systemic infection (sepsis) is likely, unless other causes are known. (NOTE)       Sepsis PCT Algorithm           Lower Respiratory Tract                                      Infection PCT Algorithm    ----------------------------     ----------------------------         PCT < 0.25 ng/mL                PCT < 0.10 ng/mL         Strongly encourage             Strongly discourage   discontinuation of antibiotics    initiation of antibiotics    ----------------------------     -----------------------------       PCT 0.25 - 0.50 ng/mL            PCT 0.10 - 0.25 ng/mL               OR       >80% decrease in PCT            Discourage initiation of                                            antibiotics      Encourage discontinuation           of antibiotics    ----------------------------     -----------------------------         PCT >= 0.50 ng/mL              PCT 0.26 - 0.50 ng/mL               AND       <80% decrease in PCT              Encourage initiation of                                             antibiotics       Encourage continuation           of antibiotics    ----------------------------     -----------------------------        PCT >= 0.50 ng/mL                  PCT > 0.50 ng/mL               AND         increase in PCT  Strongly encourage                                      initiation of antibiotics    Strongly encourage escalation           of antibiotics                                     -----------------------------                                           PCT <= 0.25 ng/mL                                                 OR                                        > 80% decrease in  PCT                                     Discontinue / Do not initiate                                             antibiotics Performed at [facility], [address] [place], [place] [address]   Protime-INR     Status: Abnormal   Collection Time: 03/27/17  2:06 AM  Result Value Ref Range   Prothrombin Time 23.9 (H) 11.4 - 15.2 seconds   INR 2.16     Comment: Performed at [facility], [address] [place], [place] [address]  Blood Culture (routine x 2)     Status: None   Collection Time: 03/27/17  2:06 AM  Result Value Ref Range   Specimen Description BLOOD LEFT FA    Special Requests      BOTTLES DRAWN AEROBIC AND ANAEROBIC Blood Culture adequate volume   Culture      NO GROWTH 5 DAYS Performed at [facility], [address] [place],  [address]    Report Status 04/01/2017 FINAL   Urinalysis, Routine w reflex microscopic     Status: Abnormal   Collection Time: 03/27/17  2:22 AM  Result Value Ref Range   Color, Urine YELLOW (A) YELLOW   APPearance CLEAR (A) CLEAR   Specific Gravity, Urine 1.011 1.005 - 1.030   pH 5.0 5.0 - 8.0   Glucose, UA NEGATIVE NEGATIVE mg/dL   Hgb urine dipstick NEGATIVE NEGATIVE   Bilirubin Urine NEGATIVE NEGATIVE   Ketones, ur NEGATIVE NEGATIVE mg/dL   Protein, ur NEGATIVE NEGATIVE mg/dL   Nitrite NEGATIVE NEGATIVE   Leukocytes, UA NEGATIVE NEGATIVE    Comment: Performed at [facility], [address], [place],  [address]  Urine culture     Status: None   Collection Time: 03/27/17  2:22 AM  Result Value Ref Range   Specimen Description      URINE, RANDOM Performed at [facility], [address], [place], [place] [address]    Special Requests      NONE Performed at [facility], [address] [place], [place] [address]    Culture      NO GROWTH Performed at [facility], [place] [address], [place], [place] [address]    Report Status 03/28/2017 FINAL    Pregnancy, urine     Status: None   Collection Time: 03/27/17  2:22 AM  Result Value Ref Range   Preg Test, Ur NEGATIVE NEGATIVE    Comment: Performed at [facility], [address] [place], [place] [address]  Glucose, capillary     Status: Abnormal   Collection Time: 03/27/17  3:39 AM  Result Value Ref Range   Glucose-Capillary 207 (H) 65 - 99 mg/dL  TSH     Status: None   Collection Time: 03/27/17  6:00 AM  Result Value Ref Range   TSH 2.625 0.350 - 4.500 uIU/mL    Comment: Performed by a 3rd Generation assay with a functional sensitivity of <=0.01 uIU/mL. Performed at [facility], [address] [place], [place] [address]   Hemoglobin A1c     Status: Abnormal   Collection Time: 03/27/17  6:00 AM  Result Value Ref Range   Hgb A1c MFr Bld 8.5 (H) 4.8 - 5.6 %    Comment: (NOTE)         Prediabetes: 5.7 - 6.4         Diabetes: >6.4         Glycemic control for adults with diabetes: <7.0    Mean Plasma Glucose 197 mg/dL    Comment: (NOTE) Performed At: [facility] [address] [place], [place] [id number] Rush Farmer MD [phone] Performed at [facility], [address] [place], [place] [address]   Glucose, capillary     Status: Abnormal   Collection Time: 03/27/17  7:52 AM  Result Value Ref Range   Glucose-Capillary 232 (H) 65 - 99 mg/dL  Basic metabolic panel     Status: Abnormal   Collection Time: 03/27/17  8:55 AM  Result Value Ref Range   Sodium 135 135 - 145 mmol/L   Potassium 6.6 (HH) 3.5 - 5.1 mmol/L    Comment: CRITICAL RESULT CALLED TO, READ BACK BY AND VERIFIED WITH: CALLED AMANDA PEREZ_0  CHC    Chloride 114 (H) 101 - 111 mmol/L   CO2 13 (L) 22 - 32 mmol/L   Glucose, Bld 241 (H) 65 - 99 mg/dL   BUN 37 (H) 6 - 20 mg/dL   Creatinine, Ser 1.98 (H) 0.44 - 1.00 mg/dL   Calcium 7.3 (L) 8.9 - 10.3 mg/dL   GFR calc non Af Amer 28 (L) >60 mL/min   GFR calc Af Amer 33 (L) >60 mL/min    Comment: (NOTE) The  eGFR has been calculated using the CKD EPI equation. This calculation has not been validated in all clinical situations. eGFR's persistently <60 mL/min signify possible Chronic Kidney Disease.    Anion gap 8 5 - 15    Comment: Performed at [facility], [address], [place], [place] [address]  Glucose, capillary     Status: Abnormal   Collection Time: 03/27/17 11:48 AM  Result Value Ref Range   Glucose-Capillary 252 (H) 65 - 99 mg/dL  Potassium  Status: Abnormal   Collection Time: 03/27/17  4:11 PM  Result Value Ref Range   Potassium 5.2 (H) 3.5 - 5.1 mmol/L    Comment: Performed at [facility], [address] [place], [place] [address]  Glucose, capillary     Status: Abnormal   Collection Time: 03/27/17  5:08 PM  Result Value Ref Range   Glucose-Capillary 199 (H) 65 - 99 mg/dL  Glucose, capillary     Status: Abnormal   Collection Time: 03/27/17  9:11 PM  Result Value Ref Range   Glucose-Capillary 190 (H) 65 - 99 mg/dL  Basic metabolic panel     Status: Abnormal   Collection Time: 03/28/17  4:43 AM  Result Value Ref Range   Sodium 136 135 - 145 mmol/L   Potassium 4.6 3.5 - 5.1 mmol/L   Chloride 112 (H) 101 - 111 mmol/L   CO2 16 (L) 22 - 32 mmol/L   Glucose, Bld 189 (H) 65 - 99 mg/dL   BUN 30 (H) 6 - 20 mg/dL   Creatinine, Ser 1.86 (H) 0.44 - 1.00 mg/dL   Calcium 7.1 (L) 8.9 - 10.3 mg/dL   GFR calc non Af Amer 30 (L) >60 mL/min   GFR calc Af Amer 35 (L) >60 mL/min    Comment: (NOTE) The eGFR has been calculated using the CKD EPI equation. This calculation has not been validated in all clinical situations. eGFR's persistently <60 mL/min signify possible Chronic Kidney Disease.    Anion gap 8 5 - 15    Comment: Performed at [facility], [address] [place], [place] [address]  Magnesium     Status: Abnormal   Collection Time: 03/28/17  4:43 AM  Result Value Ref Range   Magnesium 1.3 (L) 1.7 - 2.4 mg/dL    Comment: Performed at  [facility], [address] [place], [place] [address]  CA 125     Status: None   Collection Time: 03/28/17  4:43 AM  Result Value Ref Range   Cancer Antigen (CA) 125 20.4 0.0 - 38.1 U/mL    Comment: (NOTE) Roche [employer]ostics Electrochemiluminescence Immunoassay (ECLIA) Values obtained with different assay methods or kits cannot be used interchangeably.  Results cannot be interpreted as absolute evidence of the presence or absence of malignant disease. Performed At: [facility] [place], [place] [id number] Rush Farmer MD OZ:[id number] Performed at [facility], [address] [place], [place] [address]   Follicle stimulating hormone     Status: None   Collection Time: 03/28/17  4:43 AM  Result Value Ref Range   FSH 6.3 mIU/mL    Comment: (NOTE)                    Adult Female:                      Follicular phase      3.5 -  12.5                      Ovulation phase       4.7 -  21.5                      Luteal phase          1.7 -   7.7  Postmenopausal       25.8 - 134.8 Performed At: [facility] [place], [place] [id number] Rush Farmer MD GO:[id number] Performed at [facility], [address] [place], [place] [address]   Glucose, capillary     Status: Abnormal   Collection Time: 03/28/17  7:45 AM  Result Value Ref Range   Glucose-Capillary 194 (H) 65 - 99 mg/dL  Glucose, capillary     Status: Abnormal   Collection Time: 03/28/17 11:54 AM  Result Value Ref Range   Glucose-Capillary 203 (H) 65 - 99 mg/dL  Glucose, capillary     Status: Abnormal   Collection Time: 03/28/17  5:00 PM  Result Value Ref Range   Glucose-Capillary 258 (H) 65 - 99 mg/dL  Glucose, capillary     Status: Abnormal   Collection Time: 03/28/17  8:51 PM  Result Value Ref Range   Glucose-Capillary 209 (H) 65 - 99 mg/dL  Magnesium     Status: None   Collection Time: 03/28/17 11:19 PM  Result  Value Ref Range   Magnesium 1.8 1.7 - 2.4 mg/dL    Comment: Performed at [facility], [address] [place], [place] [address]  Basic metabolic panel     Status: Abnormal   Collection Time: 03/29/17  5:54 AM  Result Value Ref Range   Sodium 139 135 - 145 mmol/L   Potassium 4.3 3.5 - 5.1 mmol/L   Chloride 115 (H) 101 - 111 mmol/L   CO2 16 (L) 22 - 32 mmol/L   Glucose, Bld 156 (H) 65 - 99 mg/dL   BUN 21 (H) 6 - 20 mg/dL   Creatinine, Ser 1.72 (H) 0.44 - 1.00 mg/dL   Calcium 7.0 (L) 8.9 - 10.3 mg/dL   GFR calc non Af Amer 33 (L) >60 mL/min   GFR calc Af Amer 39 (L) >60 mL/min    Comment: (NOTE) The eGFR has been calculated using the CKD EPI equation. This calculation has not been validated in all clinical situations. eGFR's persistently <60 mL/min signify possible Chronic Kidney Disease.    Anion gap 8 5 - 15    Comment: Performed at [facility], [address] [place], [place] [address]  Renal function panel     Status: Abnormal   Collection Time: 03/29/17  5:54 AM  Result Value Ref Range   Sodium 138 135 - 145 mmol/L   Potassium 4.3 3.5 - 5.1 mmol/L   Chloride 115 (H) 101 - 111 mmol/L   CO2 15 (L) 22 - 32 mmol/L   Glucose, Bld 156 (H) 65 - 99 mg/dL   BUN 21 (H) 6 - 20 mg/dL   Creatinine, Ser 1.68 (H) 0.44 - 1.00 mg/dL   Calcium 7.0 (L) 8.9 - 10.3 mg/dL   Phosphorus 1.6 (L) 2.5 - 4.6 mg/dL   Albumin 2.6 (L) 3.5 - 5.0 g/dL   GFR calc non Af Amer 34 (L) >60 mL/min   GFR calc Af Amer 40 (L) >60 mL/min    Comment: (NOTE) The eGFR has been calculated using the CKD EPI equation. This calculation has not been validated in all clinical situations. eGFR's persistently <60 mL/min signify possible Chronic Kidney Disease.    Anion gap 8 5 - 15    Comment: Performed at [facility], [address] [place], [place] [address]  CBC with Differential/Platelet     Status: Abnormal   Collection Time: 03/29/17  5:54 AM  Result Value Ref Range   WBC  8.0  3.6 - 11.0 K/uL   RBC 2.74 (L) 3.80 - 5.20 MIL/uL   Hemoglobin 8.3 (L) 12.0 - 16.0 g/dL   HCT 25.2 (L) 35.0 - 47.0 %   MCV 92.0 80.0 - 100.0 fL   MCH 30.4 26.0 - 34.0 pg   MCHC 33.0 32.0 - 36.0 g/dL   RDW 14.1 11.5 - 14.5 %   Platelets 126 (L) 150 - 440 K/uL    Comment: COUNT MAY BE INACCURATE DUE TO FIBRIN CLUMPS.   Neutrophils Relative % 82 %   Neutro Abs 6.6 (H) 1.4 - 6.5 K/uL   Lymphocytes Relative 10 %   Lymphs Abs 0.8 (L) 1.0 - 3.6 K/uL   Monocytes Relative 7 %   Monocytes Absolute 0.6 0.2 - 0.9 K/uL   Eosinophils Relative 1 %   Eosinophils Absolute 0.1 0 - 0.7 K/uL   Basophils Relative 0 %   Basophils Absolute 0.0 0 - 0.1 K/uL    Comment: Performed at [facility], [address], [place], [place] [address]  Magnesium     Status: None   Collection Time: 03/29/17  5:54 AM  Result Value Ref Range   Magnesium 1.7 1.7 - 2.4 mg/dL    Comment: Performed at [facility], [address], [place], [place] [address]  Phosphorus     Status: Abnormal   Collection Time: 03/29/17  5:54 AM  Result Value Ref Range   Phosphorus 1.6 (L) 2.5 - 4.6 mg/dL    Comment: Performed at [facility], [address] [place], [place] [address]  Glucose, capillary     Status: Abnormal   Collection Time: 03/29/17  7:40 AM  Result Value Ref Range   Glucose-Capillary 164 (H) 65 - 99 mg/dL  Glucose, capillary     Status: Abnormal   Collection Time: 03/29/17 11:58 AM  Result Value Ref Range   Glucose-Capillary 208 (H) 65 - 99 mg/dL  Type and screen [place]     Status: None   Collection Time: 03/29/17 12:52 PM  Result Value Ref Range   ABO/RH(D) A POS    Antibody Screen NEG    Sample Expiration 04/01/2017    Unit Number [id number]    Blood Component Type RED CELLS,LR    Unit division 00    Status of Unit ISSUED,FINAL    Transfusion Status OK TO TRANSFUSE    Crossmatch Result Compatible    Unit Number [id number]    Blood Component  Type RED CELLS,LR    Unit division 00    Status of Unit ISSUED,FINAL    Transfusion Status OK TO TRANSFUSE    Crossmatch Result      Compatible Performed at [facility], [address] [place], [place] [address]   BPAM RBC     Status: None   Collection Time: 03/29/17 12:52 PM  Result Value Ref Range   ISSUE DATE / TIME 583094076808    Blood Product Unit Number [id number]    PRODUCT CODE [id number]    Unit Type and Rh 6200    Blood Product Expiration Date 292446286381    ISSUE DATE / TIME [id number]    Blood Product Unit Number [id number]    PRODUCT CODE [id number]    Unit Type and Rh 6200    Blood Product Expiration Date 414239532023   Prepare RBC     Status: None   Collection Time: 03/29/17  1:00 PM  Result Value Ref Range   Order Confirmation  ORDER PROCESSED BY BLOOD BANK Performed at [facility], [address] [place], [place] [address]   Glucose, capillary     Status: Abnormal   Collection Time: 03/29/17  5:16 PM  Result Value Ref Range   Glucose-Capillary 162 (H) 65 - 99 mg/dL  Glucose, capillary     Status: Abnormal   Collection Time: 03/29/17  9:08 PM  Result Value Ref Range   Glucose-Capillary 146 (H) 65 - 99 mg/dL  Basic metabolic panel     Status: Abnormal   Collection Time: 03/30/17  4:47 AM  Result Value Ref Range   Sodium 138 135 - 145 mmol/L   Potassium 4.4 3.5 - 5.1 mmol/L   Chloride 113 (H) 101 - 111 mmol/L   CO2 18 (L) 22 - 32 mmol/L   Glucose, Bld 161 (H) 65 - 99 mg/dL   BUN 16 6 - 20 mg/dL   Creatinine, Ser 1.43 (H) 0.44 - 1.00 mg/dL   Calcium 7.4 (L) 8.9 - 10.3 mg/dL   GFR calc non Af Amer 42 (L) >60 mL/min   GFR calc Af Amer 48 (L) >60 mL/min    Comment: (NOTE) The eGFR has been calculated using the CKD EPI equation. This calculation has not been validated in all clinical situations. eGFR's persistently <60 mL/min signify possible Chronic Kidney Disease.    Anion gap 7 5 - 15    Comment: Performed at  [facility], [address] [place], [place] [address]  Phosphorus     Status: None   Collection Time: 03/30/17  4:47 AM  Result Value Ref Range   Phosphorus 2.5 2.5 - 4.6 mg/dL    Comment: Performed at [facility], [address] [place], [place] [address]  Magnesium     Status: None   Collection Time: 03/30/17  4:47 AM  Result Value Ref Range   Magnesium 1.8 1.7 - 2.4 mg/dL    Comment: Performed at [facility], [address] [place], [place] [address]  CBC     Status: Abnormal   Collection Time: 03/30/17  4:47 AM  Result Value Ref Range   WBC 5.2 3.6 - 11.0 K/uL   RBC 2.80 (L) 3.80 - 5.20 MIL/uL   Hemoglobin 8.6 (L) 12.0 - 16.0 g/dL   HCT 25.5 (L) 35.0 - 47.0 %   MCV 91.0 80.0 - 100.0 fL   MCH 30.8 26.0 - 34.0 pg   MCHC 33.8 32.0 - 36.0 g/dL   RDW 13.9 11.5 - 14.5 %   Platelets 123 (L) 150 - 440 K/uL    Comment: Performed at [facility], [address] [place], [place] [address]  Glucose, capillary     Status: Abnormal   Collection Time: 03/30/17  7:57 AM  Result Value Ref Range   Glucose-Capillary 139 (H) 65 - 99 mg/dL  Prepare RBC     Status: None   Collection Time: 03/30/17 10:00 AM  Result Value Ref Range   Order Confirmation      ORDER PROCESSED BY BLOOD BANK Performed at [facility], [address], [place], [place] [address]   Aerobic/Anaerobic Culture (surgical/deep wound)     Status: None (Preliminary result)   Collection Time: 03/30/17 11:16 AM  Result Value Ref Range   Specimen Description      ABSCESS Performed at [facility], [address] [place], [place] [address]    Special Requests      RIGHT OVARY ABSCESS Performed at [facility], [place], [place] [address]    Gram Stain      ABUNDANT WBC PRESENT,BOTH PMN AND MONONUCLEAR NO ORGANISMS SEEN    Culture      NO GROWTH 2 DAYS Performed at [facility], [place] [address] [place], [place] [address]     Report Status PENDING   Glucose, capillary     Status: Abnormal   Collection Time: 03/30/17  1:33 PM  Result Value Ref Range   Glucose-Capillary 208 (H) 65 - 99 mg/dL  Glucose, capillary     Status: Abnormal   Collection Time: 03/30/17  3:41 PM  Result Value Ref Range   Glucose-Capillary 182 (H) 65 - 99 mg/dL  Glucose, capillary     Status: Abnormal   Collection Time: 03/30/17  8:11 PM  Result Value Ref Range   Glucose-Capillary 311 (H) 65 - 99 mg/dL  CBC     Status: Abnormal   Collection Time: 03/30/17  9:01 PM  Result Value Ref Range   WBC 5.8 3.6 - 11.0 K/uL   RBC 3.55 (L) 3.80 - 5.20 MIL/uL   Hemoglobin 10.7 (L) 12.0 - 16.0 g/dL   HCT 32.1 (L) 35.0 - 47.0 %   MCV 90.5 80.0 - 100.0 fL   MCH 30.2 26.0 - 34.0 pg   MCHC 33.4 32.0 - 36.0 g/dL   RDW 14.8 (H) 11.5 - 14.5 %   Platelets 138 (L) 150 - 440 K/uL    Comment: Performed at [facility], [address] [place], [place] [address]  Glucose, capillary     Status: Abnormal   Collection Time: 03/30/17 11:33 PM  Result Value Ref Range   Glucose-Capillary 311 (H) 65 - 99 mg/dL  Glucose, capillary     Status: Abnormal   Collection Time: 03/31/17  3:47 AM  Result Value Ref Range   Glucose-Capillary 286 (H) 65 - 99 mg/dL  Magnesium     Status: None   Collection Time: 03/31/17  5:52 AM  Result Value Ref Range   Magnesium 1.7 1.7 - 2.4 mg/dL    Comment: Performed at [facility], [address] [place], [place] [address]  Phosphorus     Status: None   Collection Time: 03/31/17  5:52 AM  Result Value Ref Range   Phosphorus 3.5 2.5 - 4.6 mg/dL    Comment: Performed at [facility], [address] [place], [place] [address]  CBC     Status: Abnormal   Collection Time: 03/31/17  5:52 AM  Result Value Ref Range   WBC 8.2 3.6 - 11.0 K/uL   RBC 3.11 (L) 3.80 - 5.20 MIL/uL   Hemoglobin 9.3 (L) 12.0 - 16.0 g/dL   HCT 28.1 (L) 35.0 - 47.0 %   MCV 90.3 80.0 - 100.0 fL   MCH 29.8 26.0 - 34.0 pg    MCHC 33.0 32.0 - 36.0 g/dL   RDW 14.5 11.5 - 14.5 %   Platelets 134 (L) 150 - 440 K/uL    Comment: Performed at [facility], [address] [place],  [address]  Comprehensive metabolic panel     Status: Abnormal   Collection Time: 03/31/17  5:52 AM  Result Value Ref Range   Sodium 135 135 - 145 mmol/L   Potassium 4.5 3.5 - 5.1 mmol/L   Chloride 110 101 - 111 mmol/L   CO2 17 (L) 22 - 32 mmol/L   Glucose, Bld 277 (H) 65 - 99 mg/dL  BUN 26 (H) 6 - 20 mg/dL   Creatinine, Ser 2.15 (H) 0.44 - 1.00 mg/dL   Calcium 7.5 (L) 8.9 - 10.3 mg/dL   Total Protein 6.2 (L) 6.5 - 8.1 g/dL   Albumin 2.4 (L) 3.5 - 5.0 g/dL   AST 17 15 - 41 U/L   ALT 8 (L) 14 - 54 U/L   Alkaline Phosphatase 54 38 - 126 U/L   Total Bilirubin 0.4 0.3 - 1.2 mg/dL   GFR calc non Af Amer 25 (L) >60 mL/min   GFR calc Af Amer 29 (L) >60 mL/min    Comment: (NOTE) The eGFR has been calculated using the CKD EPI equation. This calculation has not been validated in all clinical situations. eGFR's persistently <60 mL/min signify possible Chronic Kidney Disease.    Anion gap 8 5 - 15    Comment: Performed at [facility], [address] [place], [place] [address]  Glucose, capillary     Status: Abnormal   Collection Time: 03/31/17  8:16 AM  Result Value Ref Range   Glucose-Capillary 237 (H) 65 - 99 mg/dL  Glucose, capillary     Status: Abnormal   Collection Time: 03/31/17  4:59 PM  Result Value Ref Range   Glucose-Capillary 239 (H) 65 - 99 mg/dL  Glucose, capillary     Status: Abnormal   Collection Time: 03/31/17  8:19 PM  Result Value Ref Range   Glucose-Capillary 214 (H) 65 - 99 mg/dL   Comment 1 Document in Chart   Glucose, capillary     Status: Abnormal   Collection Time: 04/01/17 12:07 AM  Result Value Ref Range   Glucose-Capillary 264 (H) 65 - 99 mg/dL  Glucose, capillary     Status: Abnormal   Collection Time: 04/01/17  4:02 AM  Result Value Ref Range   Glucose-Capillary 222 (H)  65 - 99 mg/dL  Basic metabolic panel     Status: Abnormal   Collection Time: 04/01/17  7:37 AM  Result Value Ref Range   Sodium 138 135 - 145 mmol/L   Potassium 3.9 3.5 - 5.1 mmol/L   Chloride 109 101 - 111 mmol/L   CO2 20 (L) 22 - 32 mmol/L   Glucose, Bld 188 (H) 65 - 99 mg/dL   BUN 29 (H) 6 - 20 mg/dL   Creatinine, Ser 2.10 (H) 0.44 - 1.00 mg/dL   Calcium 8.1 (L) 8.9 - 10.3 mg/dL   GFR calc non Af Amer 26 (L) >60 mL/min   GFR calc Af Amer 30 (L) >60 mL/min    Comment: (NOTE) The eGFR has been calculated using the CKD EPI equation. This calculation has not been validated in all clinical situations. eGFR's persistently <60 mL/min signify possible Chronic Kidney Disease.    Anion gap 9 5 - 15    Comment: Performed at [facility], [address] [place], [place] [address]  Magnesium     Status: Abnormal   Collection Time: 04/01/17  7:37 AM  Result Value Ref Range   Magnesium 1.5 (L) 1.7 - 2.4 mg/dL    Comment: Performed at [facility], [address] [place], [place] [address]  CBC     Status: Abnormal   Collection Time: 04/01/17  7:37 AM  Result Value Ref Range   WBC 8.1 3.6 - 11.0 K/uL   RBC 3.45 (L) 3.80 - 5.20 MIL/uL   Hemoglobin 10.5 (L) 12.0 - 16.0 g/dL   HCT 31.1 (L) 35.0 - 47.0 %  MCV 90.3 80.0 - 100.0 fL   MCH 30.4 26.0 - 34.0 pg   MCHC 33.7 32.0 - 36.0 g/dL   RDW 14.6 (H) 11.5 - 14.5 %   Platelets 152 150 - 440 K/uL    Comment: Performed at [facility], [address] [place], [place] [address]  ctscan;  CLINICAL DATA:  52 year old female with abdominal pain. Concern for acute diverticulitis.  EXAM: CT ABDOMEN AND PELVIS WITHOUT CONTRAST  TECHNIQUE: Multidetector CT imaging of the abdomen and pelvis was performed following the standard protocol without IV contrast.  COMPARISON:  Abdominal MRI dated 11/13/2014 and ultrasound dated 11/12/2014  FINDINGS: Evaluation of this exam is limited in the absence of  intravenous contrast.  Lower chest: The visualized lung bases are clear.  No intra-abdominal free air or free fluid.  Hepatobiliary: Probable mild fatty infiltration of the liver. Mild enlargement of the left lobe of the liver may represent cirrhosis. No intrahepatic biliary ductal dilatation. Cholecystectomy.  Pancreas: Unremarkable. No pancreatic ductal dilatation or surrounding inflammatory changes.  Spleen: The spleen is enlarged measuring up to 16 cm in greatest length.  Adrenals/Urinary Tract: The adrenal glands are unremarkable. There is no hydronephrosis or nephrolithiasis on either side. The visualized ureters and urinary bladder appear unremarkable.  Stomach/Bowel: There is colonic diverticulosis without active inflammatory changes. Multiple normal caliber fluid-filled loops of small bowel noted which may be physiologic or represent enteritis. No bowel obstruction. Loose stool noted throughout the colon compatible with diarrheal state. Correlation with clinical exam and stool cultures recommended the appendix is normal.  Vascular/Lymphatic: Mild aortoiliac atherosclerotic disease. The abdominal aorta and IVC are otherwise grossly unremarkable on this noncontrast CT. No portal venous gas. There is no adenopathy. Recanalized appearance of the paraumbilical vein suggestive of portal hypertension.  Reproductive: The uterus is anteverted. Bilateral ovarian cysts measure up to 3.7 cm on the left. There is a complex and multi septated right adnexal mass measuring 7.6 x 6.5 x 7.9 cm. This mass predominantly composed of fatty tissue with a small focus of calcification most consistent with a teratoma. Please note this can predispose the ovary to an increased risk for torsion. If there is clinical concern for ovarian torsion further evaluation with pelvic ultrasound with duplex interrogation of ovarian flow recommended. Malignant transformation of teratoma is not  evaluated on CT. Further evaluation with MRI or surgical consult to determine treatment options recommended.  Other: None  Musculoskeletal: No acute or significant osseous findings.  IMPRESSION: 1. Diarrheal state with findings of possible enteritis. Correlation with clinical exam and stool cultures recommended no bowel obstruction. Normal appendix. 2. Colonic diverticulosis without active inflammatory changes. 3. 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 4. Right ovarian teratoma. Further evaluation with MRI or surgical consult is advised. If there is clinical concern for right ovarian torsion further evaluation with pelvic ultrasound recommended.   Electronically Signed   By: Anner Crete M.D.   On: 03/27/2017 02:14     Treatments: IV hydration, antibiotics: flagyl and rocephin , anticoagulation: heparin preop and lovenox post op , insulin: Humalog and Lantus and surgery: as above  Discharge Exam: Blood pressure (!) 144/71, pulse 78, temperature 97.7 F (36.5 C), temperature source Oral, resp. rate 17, height _0  (1.626 m), weight 108 kg (238 lb 1.6 oz), last menstrual period 02/14/2017, SpO2 99 %. General appearance: alert and cooperative Head: Normocephalic, without obvious abnormality, atraumatic Resp: clear to auscultation bilaterally Cardio: regular rate and rhythm, S1, S2 normal, no murmur,  click, rub or gallop GI: soft non tender , no rebound. normal active bs Incision/Wound:covered with would vac   Disposition: 01-Home or Self Care  Discharge Instructions    Call MD for:   Complete by:  As directed    Call MD for:  difficulty breathing, headache or visual disturbances   Complete by:  As directed    Call MD for:  extreme fatigue   Complete by:  As directed    Call MD for:  hives   Complete by:  As directed    Call MD for:  persistant dizziness or light-headedness   Complete by:  As directed    Call MD for:   persistant nausea and vomiting   Complete by:  As directed    Call MD for:  redness, tenderness, or signs of infection (pain, swelling, redness, odor or green/yellow discharge around incision site)   Complete by:  As directed    Call MD for:  severe uncontrolled pain   Complete by:  As directed    Call MD for:  temperature >100.4   Complete by:  As directed    Diet - low sodium heart healthy   Complete by:  As directed    Increase activity slowly   Complete by:  As directed      Allergies as of 04/01/2017   No Known Allergies     Medication List    TAKE these medications   amLODipine 10 MG tablet Commonly known as:  NORVASC Take 1 tablet (10 mg total) by mouth daily.   amoxicillin-clavulanate 875-125 MG tablet Commonly known as:  AUGMENTIN Take 1 tablet by mouth 2 (two) times daily for 7 days.   cyclobenzaprine 10 MG tablet Commonly known as:  FLEXERIL Take 10 mg by mouth 3 (three) times daily as needed for muscle spasms.   docusate sodium 100 MG capsule Commonly known as:  COLACE Take 1 capsule (100 mg total) by mouth daily as needed.   enoxaparin 40 MG/0.4ML injection Commonly known as:  LOVENOX Inject 0.4 mLs (40 mg total) into the skin daily. Start taking on:  04/02/2017   ferrous sulfate 325 (65 FE) MG tablet Commonly known as:  FERROUSUL Take 1 tablet (325 mg total) by mouth 2 (two) times daily with a meal.   glipiZIDE 10 MG 24 hr tablet Commonly known as:  GLUCOTROL XL Take 10 mg by mouth daily.   hydrochlorothiazide 25 MG tablet Commonly known as:  HYDRODIURIL Take 25 mg by mouth daily.   linagliptin 5 MG Tabs tablet Commonly known as:  TRADJENTA Take 5 mg by mouth daily.   lisinopril 20 MG tablet Commonly known as:  PRINIVIL,ZESTRIL Take 1 tablet (20 mg total) by mouth daily. What changed:  how much to take   magnesium oxide 400 MG tablet Commonly known as:  MAG-OX Take 400 mg by mouth 2 (two) times daily.   metFORMIN 500 MG (MOD) 24 hr  tablet Commonly known as:  GLUMETZA Take 500 mg by mouth 2 (two) times daily with a meal.   metoprolol tartrate 50 MG tablet Commonly known as:  LOPRESSOR Take 50 mg by mouth 2 (two) times daily.   ondansetron 4 MG tablet Commonly known as:  ZOFRAN Take 1 tablet (4 mg total) by mouth every 6 (six) hours as needed for nausea.   oxyCODONE-acetaminophen 5-325 MG tablet Commonly known as:  PERCOCET/ROXICET Take 1-2 tablets by mouth every 4 (four) hours as needed for moderate pain.   pantoprazole 40  MG tablet Commonly known as:  PROTONIX Take 1 tablet (40 mg total) by mouth daily. What changed:  how much to take      Follow-up Information    Schermerhorn, Gwen Her, MD Follow up in 2 week(s).   Specialty:  Obstetrics and Gynecology Why:  post op Contact information: [address] [place] [address] [phone]        Donnie Coffin, MD Follow up in 1 week(s).   Specialty:  Family Medicine Why:  f/up for kidney function and management of diabetes Contact information: [place] [address] [phone]           Signed: Gwen Her Schermerhorn 04/01/2017, 1:27 PM

## 2017-04-01 NOTE — Progress Notes (Signed)
MEDICATION RELATED CONSULT NOTE - Follow Up Consult  Pharmacy Consult for electrolyte management prior to surgery on 3/1 Indication: hypomagnesemia  No Known Allergies  Patient Measurements: Height: 5\' 4"  (162.6 cm) Weight: 238 lb 1.6 oz (108 kg) IBW/kg (Calculated) : 54.7   Sodium (mmol/L)  Date Value  04/01/2017 138  01/18/2014 136   Potassium (mmol/L)  Date Value  04/01/2017 3.9  01/18/2014 3.0 (L)   Magnesium (mg/dL)  Date Value  04/01/2017 1.5 (L)   Phosphorus (mg/dL)  Date Value  03/31/2017 3.5   Calcium (mg/dL)  Date Value  04/01/2017 8.1 (L)   Calcium, Total (mg/dL)  Date Value  01/18/2014 8.4 (L)   Albumin (g/dL)  Date Value  03/31/2017 2.4 (L)  01/18/2014 2.9 (L)  ]  Estimated Creatinine Clearance: 38 mL/min (A) (by C-G formula based on SCr of 2.1 mg/dL (H)).    Assessment: 52 yof cc lower abdominal pain presented with diarrhea now resolved. Electrolyte abnormalities noted. Pharmacy consulted to manage electrolytes prior to surgery on 3/1.  Goal of Therapy:  Electrolytes WNL.  Plan:  Mag = 1.5 - ordered Mag 2g IV x 1 dose.   Continue mag-ox 400 mg PO BID and calcium carbonate 400 mg elemental calcium PO BID.   Will recheck electrolytes with AM labs.  Olivia Canter, [facility] Clinical Pharmacist 04/01/2017,10:03 AM

## 2017-04-01 NOTE — Progress Notes (Signed)
CBG is 222. 2 Units of Insulin per Sliding Scale order. Insulin and Dose verified by M. Alroy Dust RN

## 2017-04-01 NOTE — Plan of Care (Signed)
Pt. Has been alert and oriented with cheerful affect. Color good, skin w&d. BBS clear. Up in chair for an hour and tolerated well. Using I.S. And achieving goal of 1500. U/O has been 850cc in 8 hours. Urine is clear amber. CBG levels were 214 at 2000 and Pt. Received 2 U's Sliding Scale Insulin at that time CBG was 264 and Pt. Received 3 U's of Insulin per Sliding Scale. Lower abd. Incision has wound Vac in place and is without s/s complications. Pt. States pain control with PRN Percocet.

## 2017-04-01 NOTE — Progress Notes (Signed)
Pt up to chair for breakfast. Pain well controlled with percocet x 2 tabs every 4 hours. Pt reports she is feeling better today and eager to get up and move around. CBG 164, 2 units SSI given with meal. Urine output greatly improved overnight. Will continue to monitor.

## 2017-04-02 LAB — GLUCOSE, CAPILLARY
Glucose-Capillary: 204 mg/dL — ABNORMAL HIGH (ref 65–99)
Glucose-Capillary: 169 mg/dL — ABNORMAL HIGH (ref 65–99)
Glucose-Capillary: 193 mg/dL — ABNORMAL HIGH (ref 65–99)

## 2017-04-02 LAB — CYTOLOGY - NON PAP

## 2017-04-02 LAB — SURGICAL PATHOLOGY

## 2017-04-04 LAB — AEROBIC/ANAEROBIC CULTURE (SURGICAL/DEEP WOUND): CULTURE: NO GROWTH

## 2017-04-04 LAB — AEROBIC/ANAEROBIC CULTURE W GRAM STAIN (SURGICAL/DEEP WOUND)

## 2017-10-29 ENCOUNTER — Ambulatory Visit: Payer: Self-pay | Attending: Oncology

## 2018-09-23 IMAGING — DX DG CHEST 1V PORT
1 series · 1 of 1 positions shown · non-contrast
Comparison: 11/12/2014

CLINICAL DATA: Right lower abdominal pain, sepsis

EXAM:
PORTABLE CHEST 1 VIEW

[chest ap]
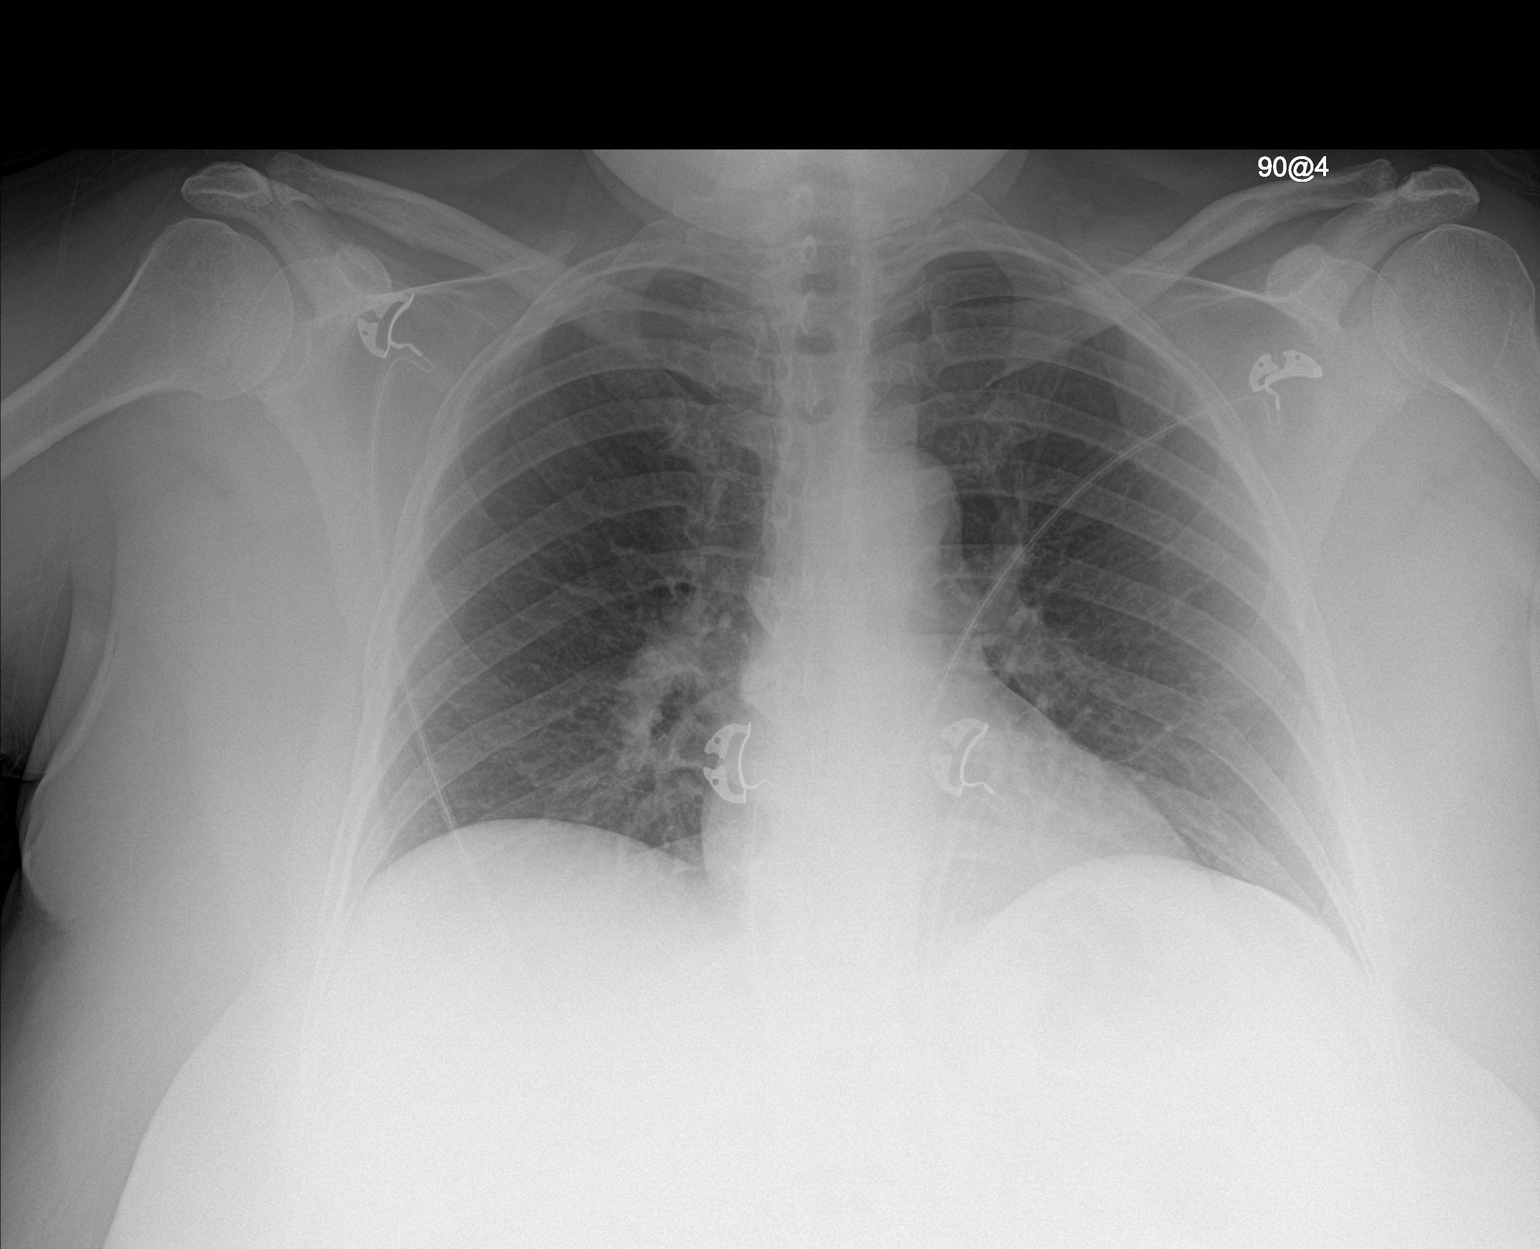

[1 of 1 positions shown; findings below may reference images not displayed]

FINDINGS: Heart and mediastinal contours are within normal limits. No focal
opacities or effusions. No acute bony abnormality.
IMPRESSION: No active disease.

## 2019-04-09 IMAGING — US US ART/VEN ABD/PELV/SCROTUM DOPPLER LTD
1 series · 13 of 25 positions shown · non-contrast
Comparison: 03/27/2017 CT abdomen and pelvis.

CLINICAL DATA: 52 y/o  F; right pelvic pain for 2 weeks.

EXAM:
TRANSABDOMINAL ULTRASOUND OF PELVIS
DOPPLER ULTRASOUND OF OVARIES
TECHNIQUE: Transabdominal ultrasound examination of the pelvis was performed
including evaluation of the uterus, ovaries, adnexal regions, and
pelvic cul-de-sac.
Color and duplex Doppler ultrasound was utilized to evaluate blood
flow to the ovaries.

[Series 1: us art/ven abd/pelv/scrotum doppler ltd · 0.23mm/px · 13 of 52 slices shown]
[im 1/52]
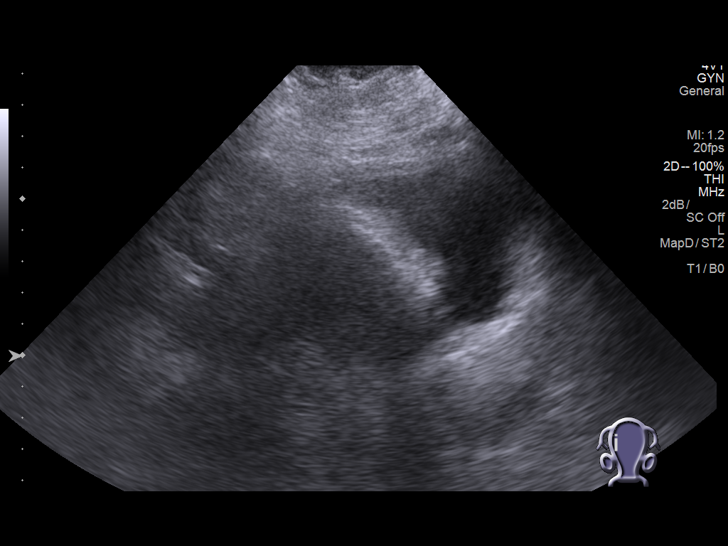
[im 5/52]
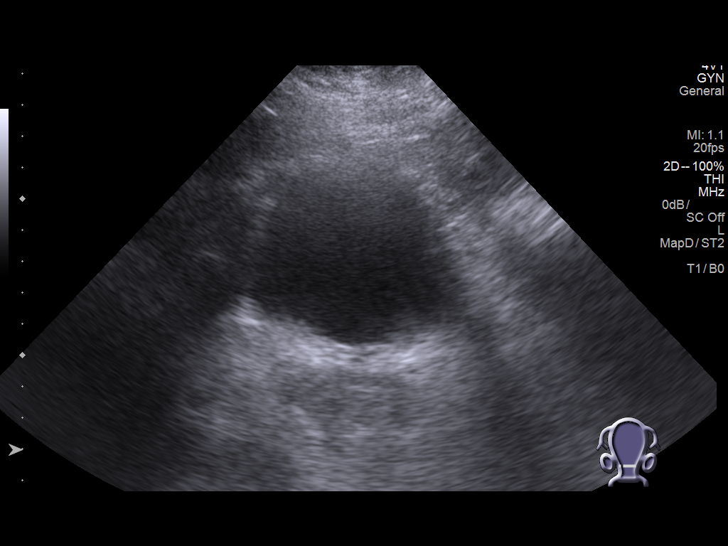
[im 9/52]
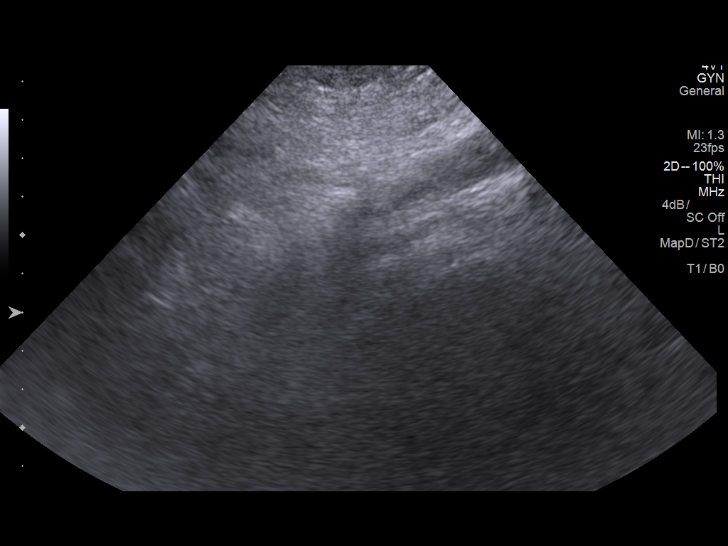
[im 13/52]
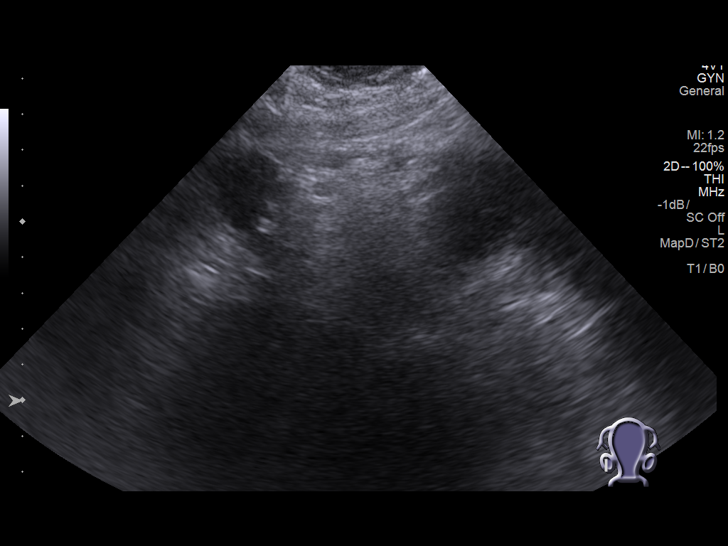
[im 18/52]
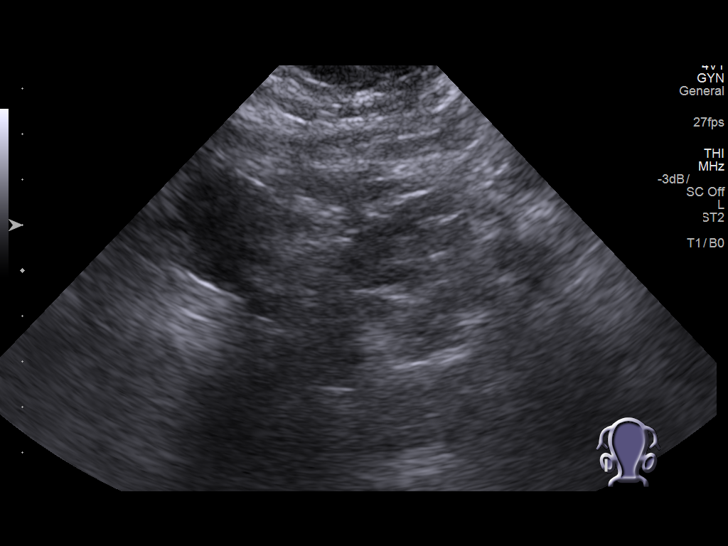
[im 22/52]
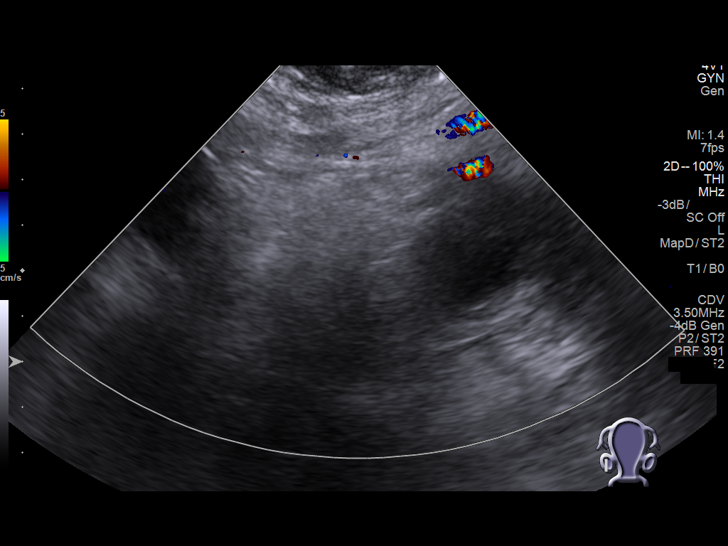
[im 26/52]
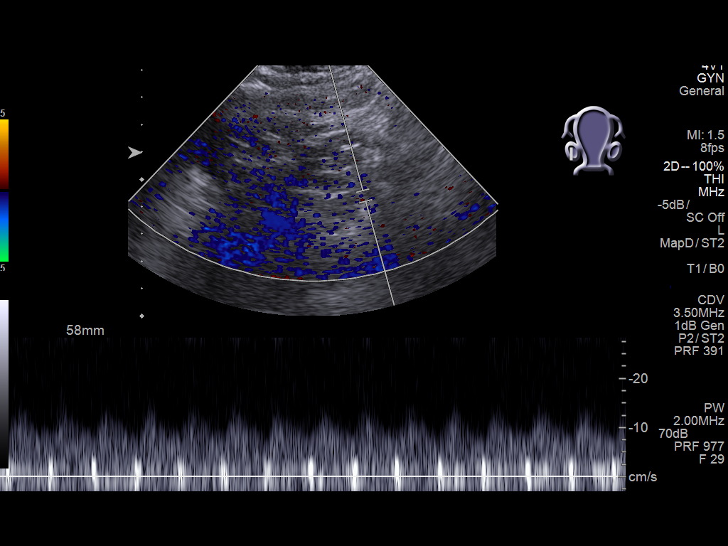
[im 30/52]
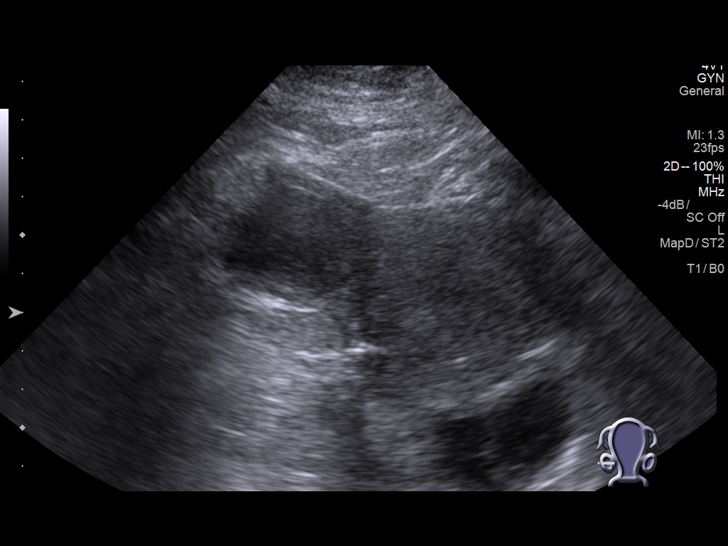
[im 35/52]
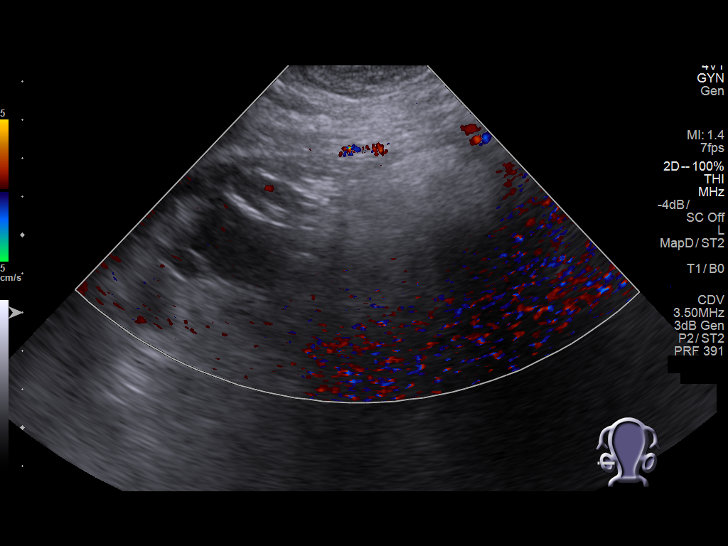
[im 39/52]
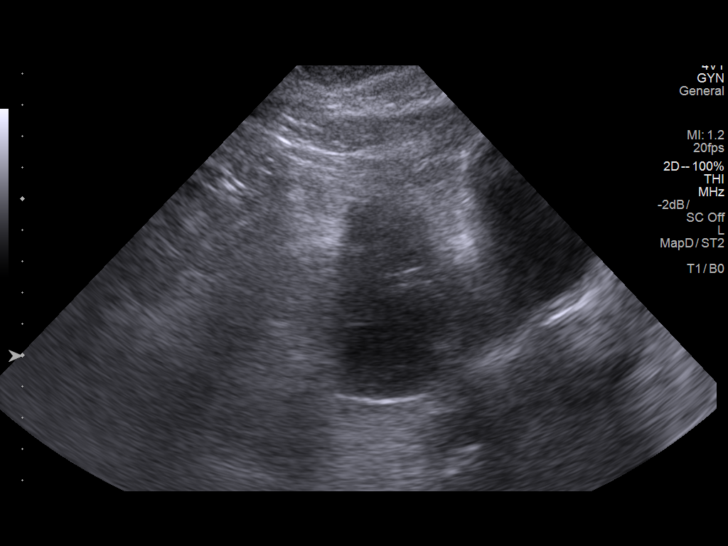
[im 43/52]
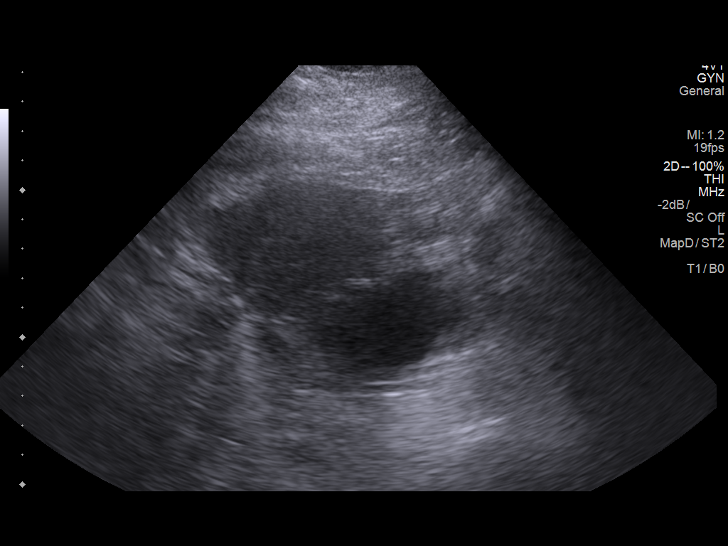
[im 47/52]
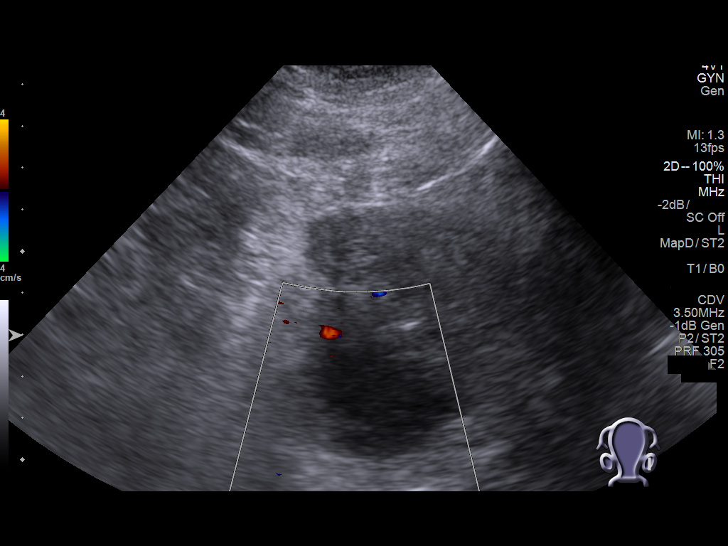
[im 52/52]
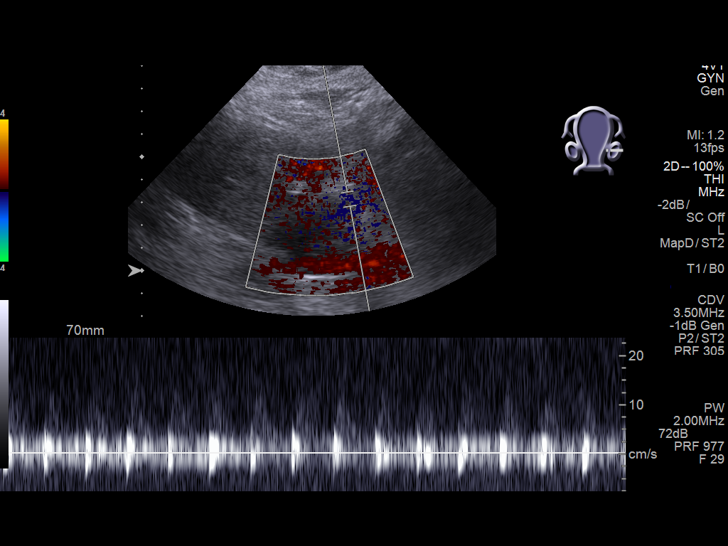

[13 of 25 positions shown; findings below may reference images not displayed]

FINDINGS: Uterus

Measurements: 10.5 x 5.5 x 5.2 cm. No fibroids or other mass
visualized.

Endometrium

Thickness: 12 mm.  Poorly visualized.

Right ovary

Measurements: 9.1 x 5.1 x 9.4 cm. Enlarged complex ovarian mass with
solid central component measuring up to 5.7 cm on ultrasound,
partially visualized, corresponding to teratoma on CT.

Left ovary

Measurements: 5.2 x 3.7 x 3.8 cm. Small avascular simple appearing
cysts.

Pulsed Doppler evaluation demonstrates normal low-resistance
arterial and venous waveforms in both ovaries.
IMPRESSION: 1. Patient refused transvaginal exam. Exam limited by body habitus
and bowel gas shadow.
2. No evidence of ovarian torsion.
3. Right ovarian mass corresponding to teratoma on prior CT.

By: Kimm Anglin M.D.

## 2022-06-06 DIAGNOSIS — H34811 Central retinal vein occlusion, right eye, with macular edema: Secondary | ICD-10-CM | POA: Diagnosis not present

## 2022-06-06 DIAGNOSIS — H4051X4 Glaucoma secondary to other eye disorders, right eye, indeterminate stage: Secondary | ICD-10-CM | POA: Diagnosis not present

## 2022-06-08 DIAGNOSIS — H34811 Central retinal vein occlusion, right eye, with macular edema: Secondary | ICD-10-CM | POA: Diagnosis not present

## 2022-06-08 DIAGNOSIS — H4051X4 Glaucoma secondary to other eye disorders, right eye, indeterminate stage: Secondary | ICD-10-CM | POA: Diagnosis not present

## 2022-06-22 DIAGNOSIS — H34811 Central retinal vein occlusion, right eye, with macular edema: Secondary | ICD-10-CM | POA: Diagnosis not present

## 2022-06-22 DIAGNOSIS — H4051X4 Glaucoma secondary to other eye disorders, right eye, indeterminate stage: Secondary | ICD-10-CM | POA: Diagnosis not present

## 2022-07-11 DIAGNOSIS — H4051X4 Glaucoma secondary to other eye disorders, right eye, indeterminate stage: Secondary | ICD-10-CM | POA: Diagnosis not present

## 2022-07-11 DIAGNOSIS — H2513 Age-related nuclear cataract, bilateral: Secondary | ICD-10-CM | POA: Diagnosis not present

## 2022-07-11 DIAGNOSIS — H34811 Central retinal vein occlusion, right eye, with macular edema: Secondary | ICD-10-CM | POA: Diagnosis not present

## 2022-08-17 DIAGNOSIS — H2513 Age-related nuclear cataract, bilateral: Secondary | ICD-10-CM | POA: Diagnosis not present

## 2022-08-17 DIAGNOSIS — H4051X4 Glaucoma secondary to other eye disorders, right eye, indeterminate stage: Secondary | ICD-10-CM | POA: Diagnosis not present

## 2022-08-17 DIAGNOSIS — H34811 Central retinal vein occlusion, right eye, with macular edema: Secondary | ICD-10-CM | POA: Diagnosis not present

## 2022-08-22 DIAGNOSIS — H34811 Central retinal vein occlusion, right eye, with macular edema: Secondary | ICD-10-CM | POA: Diagnosis not present

## 2022-08-22 DIAGNOSIS — E119 Type 2 diabetes mellitus without complications: Secondary | ICD-10-CM | POA: Diagnosis not present

## 2022-08-22 DIAGNOSIS — H4051X4 Glaucoma secondary to other eye disorders, right eye, indeterminate stage: Secondary | ICD-10-CM | POA: Diagnosis not present

## 2022-08-22 DIAGNOSIS — H2513 Age-related nuclear cataract, bilateral: Secondary | ICD-10-CM | POA: Diagnosis not present

## 2022-09-12 DIAGNOSIS — H4051X4 Glaucoma secondary to other eye disorders, right eye, indeterminate stage: Secondary | ICD-10-CM | POA: Diagnosis not present

## 2022-09-12 DIAGNOSIS — H34811 Central retinal vein occlusion, right eye, with macular edema: Secondary | ICD-10-CM | POA: Diagnosis not present

## 2022-09-26 DIAGNOSIS — Z6836 Body mass index (BMI) 36.0-36.9, adult: Secondary | ICD-10-CM | POA: Diagnosis not present

## 2022-09-26 DIAGNOSIS — K219 Gastro-esophageal reflux disease without esophagitis: Secondary | ICD-10-CM | POA: Diagnosis not present

## 2022-09-26 DIAGNOSIS — H409 Unspecified glaucoma: Secondary | ICD-10-CM | POA: Diagnosis not present

## 2022-09-26 DIAGNOSIS — H348192 Central retinal vein occlusion, unspecified eye, stable: Secondary | ICD-10-CM | POA: Diagnosis not present

## 2022-09-26 DIAGNOSIS — Z7984 Long term (current) use of oral hypoglycemic drugs: Secondary | ICD-10-CM | POA: Diagnosis not present

## 2022-09-26 DIAGNOSIS — Z794 Long term (current) use of insulin: Secondary | ICD-10-CM | POA: Diagnosis not present

## 2022-09-26 DIAGNOSIS — I10 Essential (primary) hypertension: Secondary | ICD-10-CM | POA: Diagnosis not present

## 2022-09-26 DIAGNOSIS — Z90710 Acquired absence of both cervix and uterus: Secondary | ICD-10-CM | POA: Diagnosis not present

## 2022-09-26 DIAGNOSIS — Z833 Family history of diabetes mellitus: Secondary | ICD-10-CM | POA: Diagnosis not present

## 2022-09-26 DIAGNOSIS — E1136 Type 2 diabetes mellitus with diabetic cataract: Secondary | ICD-10-CM | POA: Diagnosis not present

## 2022-10-10 DIAGNOSIS — H34811 Central retinal vein occlusion, right eye, with macular edema: Secondary | ICD-10-CM | POA: Diagnosis not present

## 2022-10-10 DIAGNOSIS — H2513 Age-related nuclear cataract, bilateral: Secondary | ICD-10-CM | POA: Diagnosis not present

## 2022-10-10 DIAGNOSIS — E119 Type 2 diabetes mellitus without complications: Secondary | ICD-10-CM | POA: Diagnosis not present

## 2022-10-10 DIAGNOSIS — H4051X4 Glaucoma secondary to other eye disorders, right eye, indeterminate stage: Secondary | ICD-10-CM | POA: Diagnosis not present

## 2022-10-23 DIAGNOSIS — H34811 Central retinal vein occlusion, right eye, with macular edema: Secondary | ICD-10-CM | POA: Diagnosis not present

## 2022-10-24 ENCOUNTER — Encounter: Payer: Self-pay | Admitting: Anesthesiology

## 2022-10-24 DIAGNOSIS — H2512 Age-related nuclear cataract, left eye: Secondary | ICD-10-CM | POA: Diagnosis not present

## 2022-10-24 DIAGNOSIS — H2511 Age-related nuclear cataract, right eye: Secondary | ICD-10-CM | POA: Diagnosis not present

## 2022-10-24 NOTE — Anesthesia Preprocedure Evaluation (Signed)
Anesthesia Evaluation    Airway        Dental   Pulmonary           Cardiovascular hypertension,   11-13-14 EF 65-70%, otherwise normal echo   Neuro/Psych    GI/Hepatic   Endo/Other  diabetes    Renal/GU      Musculoskeletal   Abdominal   Peds  Hematology   Anesthesia Other Findings   Diabetes   Hypertension Esophageal varices with hemorrhage Anemia GERD (gastroesophageal reflux disease) Fatty liver Cirrhosis       Reproductive/Obstetrics                             Anesthesia Physical Anesthesia Plan Anesthesia Quick Evaluation

## 2022-10-26 ENCOUNTER — Encounter: Payer: Self-pay | Admitting: Ophthalmology

## 2022-10-31 NOTE — Discharge Instructions (Signed)

## 2022-11-01 ENCOUNTER — Other Ambulatory Visit: Payer: Self-pay

## 2022-11-01 ENCOUNTER — Encounter
Admission: RE | Disposition: A | Discharge status: Home / Self Care | Payer: Self-pay | Source: Home / Self Care | Attending: Ophthalmology

## 2022-11-01 ENCOUNTER — Ambulatory Visit
Admission: RE | Admit: 2022-11-01 | Discharge: 2022-11-01 | Disposition: A | Discharge status: Home / Self Care | Payer: 59 | Attending: Ophthalmology | Admitting: Ophthalmology

## 2022-11-01 ENCOUNTER — Encounter: Payer: Self-pay | Admitting: Ophthalmology

## 2022-11-01 DIAGNOSIS — E1110 Type 2 diabetes mellitus with ketoacidosis without coma: Secondary | ICD-10-CM | POA: Insufficient documentation

## 2022-11-01 DIAGNOSIS — Z5309 Procedure and treatment not carried out because of other contraindication: Secondary | ICD-10-CM | POA: Diagnosis not present

## 2022-11-01 DIAGNOSIS — H2511 Age-related nuclear cataract, right eye: Secondary | ICD-10-CM | POA: Diagnosis not present

## 2022-11-01 DIAGNOSIS — E1136 Type 2 diabetes mellitus with diabetic cataract: Secondary | ICD-10-CM | POA: Insufficient documentation

## 2022-11-01 HISTORY — DX: Chronic kidney disease, stage 3 unspecified: N18.30

## 2022-11-01 HISTORY — DX: Polyneuropathy, unspecified: G62.9

## 2022-11-01 LAB — GLUCOSE, CAPILLARY
Glucose-Capillary: >600 mg/dL (ref 70–99)
Glucose-Capillary: >600 mg/dL (ref 70–99)

## 2022-11-01 SURGERY — CATARACT EXTRACTION PHACO AND INTRAOCULAR LENS PLACEMENT (IOC)
Anesthesia: Monitor Anesthesia Care | Laterality: Right

## 2022-11-01 MED ORDER — FENTANYL CITRATE (PF) 100 MCG/2ML IJ SOLN
INTRAMUSCULAR | Status: AC
  Filled 2022-11-01: qty 2

## 2022-11-01 MED ORDER — TETRACAINE HCL 0.5 % OP SOLN
1.0000 [drp] | OPHTHALMIC | Status: DC | PRN
  Administered 2022-11-01: 1 [drp] via OPHTHALMIC

## 2022-11-01 MED ORDER — TETRACAINE HCL 0.5 % OP SOLN
OPHTHALMIC | Status: AC
  Filled 2022-11-01: qty 4

## 2022-11-01 MED ORDER — ARMC OPHTHALMIC DILATING DROPS
OPHTHALMIC | Status: AC
  Filled 2022-11-01: qty 0.5

## 2022-11-01 MED ORDER — ARMC OPHTHALMIC DILATING DROPS: 1.0000 | OPHTHALMIC | Status: DC | PRN

## 2022-11-01 MED ORDER — MIDAZOLAM HCL 2 MG/2ML IJ SOLN
INTRAMUSCULAR | Status: AC
  Filled 2022-11-01: qty 2

## 2022-11-01 MED ORDER — LACTATED RINGERS IV SOLN: INTRAVENOUS | Status: DC

## 2022-11-01 NOTE — Progress Notes (Signed)
Pt procedure cancelled per anesthesia because patient's blood sugar was over 600. Pt advised to go to emergency room to be evaluated. Pt discharged home.

## 2022-11-01 NOTE — H&P (Signed)
  Bld glc >600. Surgery canceled. Pt directed to ED.

## 2022-11-01 NOTE — Progress Notes (Signed)
Patient was supposed to have cataract surgery earlier today, but overslept. When she awoke, patient came for procedure.  However, accucheck blood sugar was 'over 600" unquantifiable, checked twice.  Both Dr. Inez Pilgrim and I counseled patient and son that she is in DKA (diabetic ketoacidosis), and that this is serious and potentially life-threatening, and threatening to kidneys, heart, brain, and extremities. Urged immediately to ER, and planned ambulance, but patient refuses ambulance, and son to take patient to hospital immediately.  Surgery canceled today.

## 2022-11-04 DIAGNOSIS — N183 Chronic kidney disease, stage 3 unspecified: Secondary | ICD-10-CM | POA: Diagnosis not present

## 2022-11-04 DIAGNOSIS — Z1389 Encounter for screening for other disorder: Secondary | ICD-10-CM | POA: Diagnosis not present

## 2022-11-04 DIAGNOSIS — D500 Iron deficiency anemia secondary to blood loss (chronic): Secondary | ICD-10-CM | POA: Diagnosis not present

## 2022-11-04 DIAGNOSIS — Z1329 Encounter for screening for other suspected endocrine disorder: Secondary | ICD-10-CM | POA: Diagnosis not present

## 2022-11-04 DIAGNOSIS — E876 Hypokalemia: Secondary | ICD-10-CM | POA: Diagnosis not present

## 2022-11-04 DIAGNOSIS — E1140 Type 2 diabetes mellitus with diabetic neuropathy, unspecified: Secondary | ICD-10-CM | POA: Diagnosis not present

## 2022-11-04 DIAGNOSIS — Z0131 Encounter for examination of blood pressure with abnormal findings: Secondary | ICD-10-CM | POA: Diagnosis not present

## 2022-11-04 DIAGNOSIS — I10 Essential (primary) hypertension: Secondary | ICD-10-CM | POA: Diagnosis not present

## 2022-11-04 DIAGNOSIS — E1165 Type 2 diabetes mellitus with hyperglycemia: Secondary | ICD-10-CM | POA: Diagnosis not present

## 2022-11-04 DIAGNOSIS — Z712 Person consulting for explanation of examination or test findings: Secondary | ICD-10-CM | POA: Diagnosis not present

## 2022-11-04 DIAGNOSIS — K7460 Unspecified cirrhosis of liver: Secondary | ICD-10-CM | POA: Diagnosis not present

## 2022-11-08 DIAGNOSIS — I10 Essential (primary) hypertension: Secondary | ICD-10-CM | POA: Diagnosis not present

## 2022-11-08 DIAGNOSIS — N183 Chronic kidney disease, stage 3 unspecified: Secondary | ICD-10-CM | POA: Diagnosis not present

## 2022-11-08 DIAGNOSIS — E1140 Type 2 diabetes mellitus with diabetic neuropathy, unspecified: Secondary | ICD-10-CM | POA: Diagnosis not present

## 2022-11-08 DIAGNOSIS — E876 Hypokalemia: Secondary | ICD-10-CM | POA: Diagnosis not present

## 2022-11-08 DIAGNOSIS — K7460 Unspecified cirrhosis of liver: Secondary | ICD-10-CM | POA: Diagnosis not present

## 2022-11-08 DIAGNOSIS — D500 Iron deficiency anemia secondary to blood loss (chronic): Secondary | ICD-10-CM | POA: Diagnosis not present

## 2022-11-08 DIAGNOSIS — E1165 Type 2 diabetes mellitus with hyperglycemia: Secondary | ICD-10-CM | POA: Diagnosis not present

## 2022-11-09 ENCOUNTER — Encounter: Payer: Self-pay | Admitting: Anesthesiology

## 2022-11-14 NOTE — Anesthesia Preprocedure Evaluation (Signed)
Anesthesia Evaluation    Airway Mallampati: III  TM Distance: >3 FB     Dental   Pulmonary           Cardiovascular hypertension,   11-13-14 EF 65%, normal echo   Neuro/Psych    GI/Hepatic   Endo/Other  diabetes    Renal/GU      Musculoskeletal   Abdominal   Peds  Hematology   Anesthesia Other Findings Diabetes (HCC)  Hypertension Esophageal varices with hemorrhage  Anemia GERD (gastroesophageal reflux disease) Fatty liver Cirrhosis (HCC)  CKD (chronic kidney disease), stage III  Neuropathy     Reproductive/Obstetrics                              Anesthesia Physical Anesthesia Plan  ASA: 3  Anesthesia Plan: MAC   Post-op Pain Management:    Induction: Intravenous  PONV Risk Score and Plan:   Airway Management Planned: Natural Airway and Nasal Cannula  Additional Equipment:   Intra-op Plan:   Post-operative Plan:   Informed Consent: I have reviewed the patients History and Physical, chart, labs and discussed the procedure including the risks, benefits and alternatives for the proposed anesthesia with the patient or authorized representative who has indicated his/her understanding and acceptance.     Dental Advisory Given  Plan Discussed with: Anesthesiologist, CRNA and Surgeon  Anesthesia Plan Comments: (Patient consented for risks of anesthesia including but not limited to:  - adverse reactions to medications - damage to eyes, teeth, lips or other oral mucosa - nerve damage due to positioning  - sore throat or hoarseness - Damage to heart, brain, nerves, lungs, other parts of body or loss of life  Patient voiced understanding and assent.)         Anesthesia Quick Evaluation

## 2022-11-14 NOTE — Discharge Instructions (Signed)

## 2022-11-15 ENCOUNTER — Ambulatory Visit: Admission: RE | Admit: 2022-11-15 | Payer: 59 | Source: Home / Self Care | Admitting: Ophthalmology

## 2022-11-15 SURGERY — CATARACT EXTRACTION PHACO AND INTRAOCULAR LENS PLACEMENT (IOC)
Anesthesia: Topical | Laterality: Right

## 2022-11-15 MED ORDER — ARMC OPHTHALMIC DILATING DROPS
OPHTHALMIC | Status: AC
  Filled 2022-11-15: qty 0.5

## 2022-11-15 MED ORDER — TETRACAINE HCL 0.5 % OP SOLN
OPHTHALMIC | Status: AC
  Filled 2022-11-15: qty 4

## 2022-11-29 ENCOUNTER — Ambulatory Visit: Admission: RE | Admit: 2022-11-29 | Payer: 59 | Source: Home / Self Care | Admitting: Ophthalmology

## 2022-11-29 SURGERY — CATARACT EXTRACTION PHACO AND INTRAOCULAR LENS PLACEMENT (IOC)
Anesthesia: Topical | Laterality: Left
